# Patient Record
Sex: Female | Born: 1965 | Race: White | Hispanic: No | Marital: Married | Smoking: Former smoker
Health system: Southern US, Community
[De-identification: ages and names within clinical notes are randomized; demographics above are authoritative.]

## PROBLEM LIST (undated history)

## (undated) DIAGNOSIS — G43909 Migraine, unspecified, not intractable, without status migrainosus: Secondary | ICD-10-CM

## (undated) DIAGNOSIS — E079 Disorder of thyroid, unspecified: Secondary | ICD-10-CM

## (undated) DIAGNOSIS — F32A Depression, unspecified: Secondary | ICD-10-CM

## (undated) DIAGNOSIS — E559 Vitamin D deficiency, unspecified: Secondary | ICD-10-CM

## (undated) DIAGNOSIS — F329 Major depressive disorder, single episode, unspecified: Secondary | ICD-10-CM

## (undated) DIAGNOSIS — E538 Deficiency of other specified B group vitamins: Secondary | ICD-10-CM

## (undated) DIAGNOSIS — K219 Gastro-esophageal reflux disease without esophagitis: Secondary | ICD-10-CM

## (undated) DIAGNOSIS — M8580 Other specified disorders of bone density and structure, unspecified site: Secondary | ICD-10-CM

## (undated) DIAGNOSIS — K8020 Calculus of gallbladder without cholecystitis without obstruction: Secondary | ICD-10-CM

## (undated) DIAGNOSIS — D649 Anemia, unspecified: Secondary | ICD-10-CM

## (undated) HISTORY — DX: Calculus of gallbladder without cholecystitis without obstruction: K80.20

## (undated) HISTORY — PX: ESOPHAGOGASTRODUODENOSCOPY ENDOSCOPY: SHX5814

## (undated) HISTORY — DX: Anemia, unspecified: D64.9

## (undated) HISTORY — DX: Vitamin D deficiency, unspecified: E55.9

## (undated) HISTORY — DX: Depression, unspecified: F32.A

## (undated) HISTORY — DX: Disorder of thyroid, unspecified: E07.9

## (undated) HISTORY — DX: Major depressive disorder, single episode, unspecified: F32.9

## (undated) HISTORY — PX: TONSILLECTOMY: SUR1361

## (undated) HISTORY — DX: Gastro-esophageal reflux disease without esophagitis: K21.9

## (undated) HISTORY — DX: Other specified disorders of bone density and structure, unspecified site: M85.80

## (undated) HISTORY — DX: Deficiency of other specified B group vitamins: E53.8

## (undated) HISTORY — DX: Migraine, unspecified, not intractable, without status migrainosus: G43.909

---

## 1997-09-20 HISTORY — PX: TUBAL LIGATION: SHX77

## 1998-06-27 ENCOUNTER — Ambulatory Visit (HOSPITAL_COMMUNITY): Admission: RE | Admit: 1998-06-27 | Discharge: 1998-06-27 | Payer: Self-pay | Admitting: Obstetrics and Gynecology

## 1998-09-20 HISTORY — PX: TONSILLECTOMY: SUR1361

## 1999-03-03 ENCOUNTER — Ambulatory Visit (HOSPITAL_BASED_OUTPATIENT_CLINIC_OR_DEPARTMENT_OTHER): Admission: RE | Admit: 1999-03-03 | Discharge: 1999-03-03 | Payer: Self-pay | Admitting: Otolaryngology

## 2000-05-02 ENCOUNTER — Ambulatory Visit (HOSPITAL_COMMUNITY): Admission: RE | Admit: 2000-05-02 | Discharge: 2000-05-02 | Payer: Self-pay | Admitting: Geriatric Medicine

## 2000-06-23 ENCOUNTER — Encounter: Admission: RE | Admit: 2000-06-23 | Discharge: 2000-06-23 | Payer: Self-pay | Admitting: Geriatric Medicine

## 2000-06-23 ENCOUNTER — Encounter: Payer: Self-pay | Admitting: Geriatric Medicine

## 2000-11-30 ENCOUNTER — Encounter: Payer: Self-pay | Admitting: Obstetrics and Gynecology

## 2000-11-30 ENCOUNTER — Encounter: Admission: RE | Admit: 2000-11-30 | Discharge: 2000-11-30 | Payer: Self-pay | Admitting: Obstetrics and Gynecology

## 2001-07-10 ENCOUNTER — Encounter: Admission: RE | Admit: 2001-07-10 | Discharge: 2001-07-10 | Payer: Self-pay | Admitting: Geriatric Medicine

## 2001-07-10 ENCOUNTER — Encounter: Payer: Self-pay | Admitting: Geriatric Medicine

## 2003-01-15 ENCOUNTER — Encounter: Admission: RE | Admit: 2003-01-15 | Discharge: 2003-01-15 | Payer: Self-pay | Admitting: Geriatric Medicine

## 2003-01-15 ENCOUNTER — Encounter: Payer: Self-pay | Admitting: Geriatric Medicine

## 2004-07-01 ENCOUNTER — Other Ambulatory Visit: Admission: RE | Admit: 2004-07-01 | Discharge: 2004-07-01 | Payer: Self-pay | Admitting: Obstetrics and Gynecology

## 2005-06-22 ENCOUNTER — Encounter: Admission: RE | Admit: 2005-06-22 | Discharge: 2005-06-22 | Payer: Self-pay | Admitting: Obstetrics and Gynecology

## 2005-07-15 ENCOUNTER — Other Ambulatory Visit: Admission: RE | Admit: 2005-07-15 | Discharge: 2005-07-15 | Payer: Self-pay | Admitting: Obstetrics and Gynecology

## 2005-10-04 ENCOUNTER — Encounter: Admission: RE | Admit: 2005-10-04 | Discharge: 2005-10-04 | Payer: Self-pay | Admitting: Obstetrics and Gynecology

## 2006-08-30 ENCOUNTER — Other Ambulatory Visit: Admission: RE | Admit: 2006-08-30 | Discharge: 2006-08-30 | Payer: Self-pay | Admitting: Geriatric Medicine

## 2006-09-07 ENCOUNTER — Encounter: Admission: RE | Admit: 2006-09-07 | Discharge: 2006-09-07 | Payer: Self-pay | Admitting: Geriatric Medicine

## 2006-09-20 HISTORY — PX: LASIK: SHX215

## 2007-09-11 ENCOUNTER — Encounter: Admission: RE | Admit: 2007-09-11 | Discharge: 2007-09-11 | Payer: Self-pay | Admitting: Geriatric Medicine

## 2007-09-21 HISTORY — PX: ABDOMINAL HYSTERECTOMY: SHX81

## 2007-12-27 ENCOUNTER — Other Ambulatory Visit: Admission: RE | Admit: 2007-12-27 | Discharge: 2007-12-27 | Payer: Self-pay | Admitting: Obstetrics and Gynecology

## 2008-01-29 ENCOUNTER — Ambulatory Visit (HOSPITAL_COMMUNITY): Admission: RE | Admit: 2008-01-29 | Discharge: 2008-01-30 | Payer: Self-pay | Admitting: Obstetrics and Gynecology

## 2008-01-29 ENCOUNTER — Encounter (INDEPENDENT_AMBULATORY_CARE_PROVIDER_SITE_OTHER): Payer: Self-pay | Admitting: Obstetrics and Gynecology

## 2008-09-11 ENCOUNTER — Encounter: Admission: RE | Admit: 2008-09-11 | Discharge: 2008-09-11 | Payer: Self-pay | Admitting: Urology

## 2008-12-26 ENCOUNTER — Encounter: Admission: RE | Admit: 2008-12-26 | Discharge: 2008-12-26 | Payer: Self-pay | Admitting: Geriatric Medicine

## 2009-09-18 ENCOUNTER — Encounter: Admission: RE | Admit: 2009-09-18 | Discharge: 2009-09-18 | Payer: Self-pay | Admitting: Geriatric Medicine

## 2009-09-20 HISTORY — PX: COLONOSCOPY: SHX174

## 2009-12-09 ENCOUNTER — Encounter (INDEPENDENT_AMBULATORY_CARE_PROVIDER_SITE_OTHER): Payer: Self-pay | Admitting: *Deleted

## 2010-01-13 ENCOUNTER — Ambulatory Visit: Payer: Self-pay | Admitting: Gastroenterology

## 2010-01-13 ENCOUNTER — Encounter (INDEPENDENT_AMBULATORY_CARE_PROVIDER_SITE_OTHER): Payer: Self-pay | Admitting: *Deleted

## 2010-01-13 DIAGNOSIS — K5909 Other constipation: Secondary | ICD-10-CM | POA: Insufficient documentation

## 2010-01-14 LAB — CONVERTED CEMR LAB
BUN: 12 mg/dL (ref 6–23)
Basophils Absolute: 0 10*3/uL (ref 0.0–0.1)
CO2: 28 meq/L (ref 19–32)
Calcium: 9.5 mg/dL (ref 8.4–10.5)
Chloride: 108 meq/L (ref 96–112)
Creatinine, Ser: 0.7 mg/dL (ref 0.4–1.2)
GFR calc non Af Amer: 96.12 mL/min (ref >60)
Glucose, Bld: 84 mg/dL (ref 70–99)
Hemoglobin: 12.9 g/dL (ref 12.0–15.0)
Lymphocytes Relative: 38.5 % (ref 12.0–46.0)
MCHC: 34.5 g/dL (ref 30.0–36.0)
MCV: 87.4 fL (ref 78.0–100.0)
Potassium: 4.6 meq/L (ref 3.5–5.1)
RDW: 13.9 % (ref 11.5–14.6)
Sodium: 141 meq/L (ref 135–145)
WBC: 6.4 10*3/uL (ref 4.5–10.5)

## 2010-01-21 ENCOUNTER — Ambulatory Visit: Payer: Self-pay | Admitting: Gastroenterology

## 2010-01-21 ENCOUNTER — Encounter (INDEPENDENT_AMBULATORY_CARE_PROVIDER_SITE_OTHER): Payer: Self-pay | Admitting: *Deleted

## 2010-01-27 ENCOUNTER — Encounter: Payer: Self-pay | Admitting: Gastroenterology

## 2010-03-10 ENCOUNTER — Ambulatory Visit: Payer: Self-pay | Admitting: Gastroenterology

## 2010-04-13 ENCOUNTER — Telehealth: Payer: Self-pay | Admitting: Gastroenterology

## 2010-06-25 ENCOUNTER — Telehealth: Payer: Self-pay | Admitting: Gastroenterology

## 2010-09-20 DIAGNOSIS — Z780 Asymptomatic menopausal state: Secondary | ICD-10-CM

## 2010-09-20 HISTORY — DX: Asymptomatic menopausal state: Z78.0

## 2010-09-25 ENCOUNTER — Encounter
Admission: RE | Admit: 2010-09-25 | Discharge: 2010-09-25 | Payer: Self-pay | Source: Home / Self Care | Attending: Geriatric Medicine | Admitting: Geriatric Medicine

## 2010-10-11 ENCOUNTER — Encounter: Payer: Self-pay | Admitting: Geriatric Medicine

## 2010-10-22 NOTE — Procedures (Signed)
Summary: Colonoscopy  Patient: [name] Note: All result statuses are Final unless otherwise noted.  Tests: (1) Colonoscopy (COL)   COL Colonoscopy           DONE     [facility]     [address]     [place], [place]  [address]           COLONOSCOPY PROCEDURE REPORT           PATIENT:  Sabrina Mcdaniel, Sabrina Mcdaniel  MR#:  [id number]     BIRTHDATE:  12/11/1965, 44 yrs. old  GENDER:  female     ENDOSCOPIST:  Rachael Fee, MD     REF. BY:  Merlene Laughter, M.D.     PROCEDURE DATE:  01/21/2010     PROCEDURE:  Colonoscopy with snare polypectomy     ASA CLASS:  Class II     INDICATIONS:  chronic constipation     MEDICATIONS:   Fentanyl 75 mcg IV, Versed 7 mg IV     DESCRIPTION OF PROCEDURE:   After the risks benefits and     alternatives of the procedure were thoroughly explained, informed     consent was obtained.  Digital rectal exam was performed and     revealed no abnormalities.   The LB CF-[id number] endoscope     was introduced through the anus and advanced to the cecum, which     was identified by both the appendix and ileocecal valve, without     limitations.  The quality of the prep was good, using MoviPrep.     The instrument was then slowly withdrawn as the colon was fully     examined.     <<PROCEDUREIMAGES>>     FINDINGS: Two small sessile polyps were found, both were removed     and sent to pathology (jar 1). These were 2-15mm across, removed     with cold snare, located in transverse colon and sigmoid colon     (see image4 and image3).  This was otherwise a normal examination     of the colon (see image5, image2, and image1). Retroflexed views     in the rectum revealed no abnormalities. The scope was then     withdrawn from the patient and the procedure completed.     COMPLICATIONS:  None     ENDOSCOPIC IMPRESSION:     1) Two small polyp, both removed and sent to pathology     2) Otherwise normal examination           RECOMMENDATIONS:     1) If the polyp(s)  removed today are proven to be adenomatous     (pre-cancerous) polyps, you will need a repeat colonoscopy in 5     years. Otherwise you should continue to follow colorectal cancer     screening guidelines for "routine risk" patients with colonoscopy     in 10 years.     2) You will receive a letter within 1-2 weeks with the results     of your biopsy as well as final recommendations. Please call my     office if you have not received a letter after 3 weeks.     3) Continue 2 doses of miralax a day, also start 1 tbs of citrucel     fiber supplement daily.     4) Dr. Christella Hartigan' office will call to schedule return appointment in     6-7 weeks.  ______________________________     Rachael Fee, MD           n.     eSIGNED:   Rachael Fee at 01/21/2010 02:13 PM           Lou Miner, [id number]  Note: An exclamation mark (!) indicates a result that was not dispersed into the flowsheet. Document Creation Date: 01/21/2010 2:13 PM _______________________________________________________________________  (1) Order result status: Final Collection or observation date-time: 01/21/2010 14:06 Requested date-time:  Receipt date-time:  Reported date-time:  Referring Physician:   Ordering Physician: Rob Bunting [phone]) Specimen Source:  Source: Launa Grill Order Number: [id number] Lab site:   Appended Document: Colonoscopy     Procedures Next Due Date:    Colonoscopy: 01/2020

## 2010-10-22 NOTE — Letter (Signed)
Summary: Results Letter  [facility]  [address] [place], [place] [address]   Phone: [phone]  Fax: [phone]        Jan 27, 2010 MRN: [id number]    Sabrina Mcdaniel [address] [place], [place]  [address]    Dear Ms. Haver,   Good news.  The polyp(s) that were removed during your recent procedure were NOT pre-cancerous.  You should continue to follow current colorectal cancer screening guidelines with a repeat colonoscopy in 10 years.  We will therefore put your information in our reminder system and will contact you in 10 years to schedule a repeat procedure.  Please call if you have any questions or concerns.  Otherwise continue the suggestions outline on the day of the procedure and I look forward to seeing you back in the office to see if this has helped.     Sincerely,  Rachael Fee MD  This letter has been electronically signed by your physician.  Appended Document: Results Letter letter mailed 5.10.11

## 2010-10-22 NOTE — Progress Notes (Signed)
Summary: Triage  Phone Note Call from Patient Call back at Home Phone [phone]   Caller: Patient Call For: Dr. Christella Hartigan Reason for Call: Talk to Nurse Summary of Call: The AMITIZA 24 Mcg is to much....cannot function b/c of the diarrhea...still experiencing bloating Initial call taken by: Karna Christmas,  June 25, 2010 1:37 PM  Follow-up for Phone Call        Amitiza 24 mcg at bedtime is to much.  She has diarrhea and bloating.  She was on Amitiza 8 micrograms that did not work well enough.  She also has a feeling of "fullness"  and an uncomfortable feeling in her chest.  She is taking Prilosec once daily 20-30 minutes before breakfast.  This has helped the heartburn.  Please advise Follow-up by: Chales Abrahams CMA Duncan Dull),  June 25, 2010 1:47 PM  Additional Follow-up for Phone Call Additional follow up Details #1::        let's try pills, take two pills ( total) twice daily.  disp 120, 1 refill Additional Follow-up by: Rachael Fee MD,  June 25, 2010 1:56 PM    Additional Follow-up for Phone Call Additional follow up Details #2::    pt aware rx sent Follow-up by: Chales Abrahams CMA (AAMA),  June 25, 2010 2:00 PM  New/Updated Medications: AMITIZA 8 MCG CAPS (LUBIPROSTONE) take 2 by mouth two times a day Prescriptions: AMITIZA 8 MCG CAPS (LUBIPROSTONE) take 2 by mouth two times a day  #120 x 1   Entered by:   Chales Abrahams CMA (AAMA)   Authorized by:   Rachael Fee MD   Signed by:   Chales Abrahams CMA (AAMA) on 06/25/2010   Method used:   Electronically to        [facility] (retail)       [address]       [place], [place]  [address]       Ph: [phone]       Fax: [phone]   RxID:   [id number]

## 2010-10-22 NOTE — Letter (Signed)
Summary: New Patient letter  [facility]  [address] [place], [place] [address]   Phone: [phone]  Fax: [phone]       12/09/2009 MRN: [id number]  Sabrina Mcdaniel [address] [place], [place]  [address]  Dear Sabrina Mcdaniel,  Welcome to the Gastroenterology Division at [facility].    You are scheduled to see Dr.  Rob Bunting on 01-13-2010 at 3:00pm on the 3rd floor at [facility], [address]  We ask that you try to arrive at our office 15 minutes prior to your appointment time to allow for check-in.  We would like you to complete the enclosed self-administered evaluation form prior to your visit and bring it with you on the day of your appointment.  We will review it with you.  Also, please bring a complete list of all your medications or, if you prefer, bring the medication bottles and we will list them.  Please bring your insurance card so that we may make a copy of it.  If your insurance requires a referral to see a specialist, please bring your referral form from your primary care physician.  Co-payments are due at the time of your visit and may be paid by cash, check or credit card.     Your office visit will consist of a consult with your physician (includes a physical exam), any laboratory testing he/she may order, scheduling of any necessary diagnostic testing (e.g. x-ray, ultrasound, CT-scan), and scheduling of a procedure (e.g. Endoscopy, Colonoscopy) if required.  Please allow enough time on your schedule to allow for any/all of these possibilities.    If you cannot keep your appointment, please call [phone] to cancel or reschedule prior to your appointment date.  This allows [place] the opportunity to schedule an appointment for another patient in need of care.  If you do not cancel or reschedule by 5 p.m. the business day prior to your appointment date, you will be charged a $50.00 late cancellation/no-show fee.    Thank you for choosing  [facility] for your medical needs.  We appreciate the opportunity to care for you.  Please visit [place] at our website  to learn more about our practice.                     Sincerely,                                                             The Gastroenterology Division

## 2010-10-22 NOTE — Letter (Signed)
Summary: [facility] Instructions  [facility]  [address] [place], [place] [address]   Phone: [phone]  Fax: [phone]       Sabrina Mcdaniel    [birth year]    MRN: [id number]        Procedure Day /Date:01/19/10     Arrival Time:930 am     Procedure Time:1030 am     Location of Procedure:                    X  [facility] (4th Floor)   PREPARATION FOR COLONOSCOPY WITH MOVIPREP   Starting 5 days prior to your procedure 01/14/10  do not eat nuts, seeds, popcorn, corn, beans, peas,  salads, or any raw vegetables.  Do not take any fiber supplements (e.g. Metamucil, Citrucel, and Benefiber).  THE DAY BEFORE YOUR PROCEDURE         DATE: 01/18/10  DAY: SUN  1.  Drink clear liquids the entire day-NO SOLID FOOD  2.  Do not drink anything colored red or purple.  Avoid juices with pulp.  No orange juice.  3.  Drink at least 64 oz. (8 glasses) of fluid/clear liquids during the day to prevent dehydration and help the prep work efficiently.  CLEAR LIQUIDS INCLUDE: Water Jello Ice Popsicles Tea (sugar ok, no milk/cream) Powdered fruit flavored drinks Coffee (sugar ok, no milk/cream) Gatorade Juice: apple, white grape, white cranberry  Lemonade Clear bullion, consomm, broth Carbonated beverages (any kind) Strained chicken noodle soup Hard Candy                             4.  In the morning, mix first dose of MoviPrep solution:    Empty 1 Pouch A and 1 Pouch B into the disposable container    Add lukewarm drinking water to the top line of the container. Mix to dissolve    Refrigerate (mixed solution should be used within 24 hrs)  5.  Begin drinking the prep at 5:00 p.m. The MoviPrep container is divided by 4 marks.   Every 15 minutes drink the solution down to the next mark (approximately 8 oz) until the full liter is complete.   6.  Follow completed prep with 16 oz of clear liquid of your choice (Nothing red or purple).  Continue to drink clear  liquids until bedtime.  7.  Before going to bed, mix second dose of MoviPrep solution:    Empty 1 Pouch A and 1 Pouch B into the disposable container    Add lukewarm drinking water to the top line of the container. Mix to dissolve    Refrigerate  THE DAY OF YOUR PROCEDURE      DATE: 01/19/10 DAY:MON  Beginning at 530 a.m. (5 hours before procedure):         1. Every 15 minutes, drink the solution down to the next mark (approx 8 oz) until the full liter is complete.  2. Follow completed prep with 16 oz. of clear liquid of your choice.    3. You may drink clear liquids until 830 am  (2 HOURS BEFORE PROCEDURE).   MEDICATION INSTRUCTIONS  Unless otherwise instructed, you should take regular prescription medications with a small sip of water   as early as possible the morning of your procedure. ients -            OTHER INSTRUCTIONS  You will need a responsible adult at  least 46 years of age to accompany you and drive you home.   This person must remain in the waiting room during your procedure.  Wear loose fitting clothing that is easily removed.  Leave jewelry and other valuables at home.  However, you may wish to bring a book to read or  an iPod/MP3 player to listen to music as you wait for your procedure to start.  Remove all body piercing jewelry and leave at home.  Total time from sign-in until discharge is approximately 2-3 hours.  You should go home directly after your procedure and rest.  You can resume normal activities the  day after your procedure.  The day of your procedure you should not:   Drive   Make legal decisions   Operate machinery   Drink alcohol   Return to work  You will receive specific instructions about eating, activities and medications before you leave.    The above instructions have been reviewed and explained to me by   _______________________    I fully understand and can verbalize these instructions  _____________________________ Date _________

## 2010-10-22 NOTE — Progress Notes (Signed)
Summary: Triage  Phone Note Call from Patient Call back at Home Phone [phone]   Caller: Patient Call For: Dr. Christella Hartigan Reason for Call: Talk to Nurse Summary of Call: Update: Prilosec seems to controling her heartburn but still only having a BM every 3-4 days with the Amitiza.[place] KitchenMarland KitchenMarland KitchenMarland Kitchenswitched pharmacies to [facility] on [address] Initial call taken by: Karna Christmas,  April 13, 2010 12:47 PM  Follow-up for Phone Call        Pt calling to update pt that the Prilosec is helping great but  the Amitiza is not working as well.  Only going every 3-4 days.  It is only 1 time per day then.  Still concerned about gaining weight.  What else can she try? Should she increase the Amitiza? Follow-up by: Chales Abrahams CMA Duncan Dull),  April 13, 2010 1:41 PM  Additional Follow-up for Phone Call Additional follow up Details #1::        lets increase her amitiza to pills, take one pill two times a day, disp 60 with 2 refills. Additional Follow-up by: Rachael Fee MD,  April 14, 2010 7:23 AM    Additional Follow-up for Phone Call Additional follow up Details #2::    pt aware new rx sent Follow-up by: Chales Abrahams CMA Duncan Dull),  April 14, 2010 1:41 PM  New/Updated Medications: AMITIZA 24 MCG CAPS (LUBIPROSTONE) 1 by mouth two times a day Prescriptions: AMITIZA 24 MCG CAPS (LUBIPROSTONE) 1 by mouth two times a day  #60 x 2   Entered by:   Chales Abrahams CMA (AAMA)   Authorized by:   Rachael Fee MD   Signed by:   Chales Abrahams CMA (AAMA) on 04/14/2010   Method used:   Electronically to        [facility] (retail)       [address]       [place], [place]  [address]       Ph: [phone]       Fax: [phone]   RxID:   [id number]

## 2010-10-22 NOTE — Letter (Signed)
Summary: Appt Reminder 2  [facility]  [address] [place], [place] [address]   Phone: [phone]  Fax: [phone]        Jan 21, 2010 MRN: [id number]    Sabrina Mcdaniel [address] [place], [place]  [address]    Dear Ms. Buzby,   You have a return appointment with Dr. Christella Hartigan on 03/03/10 at 9:15am.  Please remember to bring a complete list of the medicines you are taking, your insurance card and your co-pay.  If you have to cancel or reschedule this appointment, please call before 5:00 pm the evening before to avoid a cancellation fee.  If you have any questions or concerns, please call [phone].    Sincerely,    Chales Abrahams CMA (AAMA)

## 2010-10-22 NOTE — Assessment & Plan Note (Signed)
History of Present Illness Visit Type: Initial Visit Primary GI MD: Rob Bunting MD Primary Provider: Merlene Laughter, MD Chief Complaint: dyspepsia, bloating History of Present Illness:     very pleasant 45 year old woman who has had years of myriad GI symptoms.  She is bothered by bloating.  Constipation for many years (2-3 years).  She will usually have a BM only once a week.  She  has a full feeling, even 2 hours after eating.  A lot of belching.  She never sees blood in stool.  Rectocele repair in 2009 and hysterectomy.  she takes excedrin or advil about once a week.  She has gained about 10-15 pounds in the past year.  she has tried benefiber for about a month or two and didn't notice much improvement.  She tried miralax for 2-3 months without improvement in frequency.             Current Medications (verified): 1)  Cyanocobalamin 1000 Mcg/ml Soln (Cyanocobalamin) .... Monthly 2)  Prilosec Otc 20 Mg Tbec (Omeprazole Magnesium) .... Once Daily As Needed 3)  Melatonin 3 Mg Tabs (Melatonin) .... At Bedtime 4)  Multivitamins  Tabs (Multiple Vitamin) .... Once Daily 5)  Excedrin Migraine 250-250-65 Mg Tabs (Aspirin-Acetaminophen-Caffeine) .... As Needed 6)  Advil 200 Mg Tabs (Ibuprofen) .... As Needed  Allergies (verified): No Known Drug Allergies  Past History:  Past Medical History: anemia Chronic headaches Gallstones GERD  Past Surgical History: hysterectomy 2009 Tubal ligation 1997  Family History: no colon cancer  Social History: she is married, she has 2 children, she works as a Environmental consultant, she quit smoking, she does not drink alcohol or caffeinated beverages  Review of Systems       Pertinent positive and negative review of systems were noted in the above HPI and GI specific review of systems.  All other review of systems was otherwise negative.   Vital Signs:  Patient profile:   45 year old female Height:      63 inches Weight:       180.38 pounds BMI:     32.07 Pulse rate:   68 / minute Pulse rhythm:   regular BP sitting:   110 / 68  (left arm) Cuff size:   regular  Vitals Entered By: June McMurray CMA Duncan Dull) (January 13, 2010 2:45 PM)  Physical Exam  Additional Exam:  Constitutional: generally well appearing Psychiatric: alert and oriented times 3 Eyes: extraocular movements intact Mouth: oropharynx moist, no lesions Neck: supple, no lymphadenopathy Cardiovascular: heart regular rate and rythm Lungs: CTA bilaterally Abdomen: soft, non-tender, non-distended, no obvious ascites, no peritoneal signs, normal bowel sounds Extremities: no lower extremity edema bilaterally Skin: no lesions on visible extremities    Impression & Recommendations:  Problem # 1:  chronic constipation I think her biggest issue is her constipation and this is leading to a lot of upper and lower GI discomforts including dyspepsia, bloating, belching, abdominal discomforts. We will proceed with colonoscopy at her soonest convenience to rule out neoplastic cause however think this is unlikely. Would also check a basic set of labs including a CBC, basic metabolic profile, thyroid testing. However begin taking MiraLax 2 doses the day prior to her colonoscopy and following that I will want her to be on a mixture of MiraLax and fiber supplements for about one to 2 months to see if that helps.  Other Orders: TLB-CBC Platelet - w/Differential (85025-CBCD) TLB-TSH (Thyroid Stimulating Hormone) (84443-TSH) TLB-BMP (Basic Metabolic Panel-BMET) (80048-METABOL)  Patient  Instructions: 1)  You will be scheduled to have a colonoscopy. 2)  Please take 2 capfulls of miralax every day before the colonoscopy. 3)  You will get lab test(s) done today (tsh, cbc, bmet). 4)  The medication list was reviewed and reconciled.  All changed / newly prescribed medications were explained.  A complete medication list was provided to the patient / caregiver.  Appended  Document: Orders Update/movi    Clinical Lists Changes  Medications: Added new medication of MOVIPREP 100 GM  SOLR (PEG-KCL-NACL-NASULF-NA ASC-C) As per prep instructions. - Signed Rx of MOVIPREP 100 GM  SOLR (PEG-KCL-NACL-NASULF-NA ASC-C) As per prep instructions.;  #1 x 0;  Signed;  Entered by: Sabrina Mcdaniel CMA (AAMA);  Authorized by: Rachael Fee MD;  Method used: Electronically to [facility] # [id number]*, [address] [place], [place], [place]  [address], Ph: [phone] or [phone], Fax: [phone] Orders: Added new Test order of Colonoscopy (Colon) - Signed    Prescriptions: MOVIPREP 100 GM  SOLR (PEG-KCL-NACL-NASULF-NA ASC-C) As per prep instructions.  #1 x 0   Entered by:   Sabrina Mcdaniel CMA (AAMA)   Authorized by:   Rachael Fee MD   Signed by:   Sabrina Mcdaniel CMA (AAMA) on 01/13/2010   Method used:   Electronically to        [id number] (retail)       [address]       [place], [place]  [address]       Ph: [phone] or [phone]       Fax: [phone]   RxID:   [id number]

## 2010-10-22 NOTE — Miscellaneous (Signed)
Summary: med update  Clinical Lists Changes  Medications: Removed medication of MOVIPREP 100 GM  SOLR (PEG-KCL-NACL-NASULF-NA ASC-C) As per prep instructions.

## 2010-10-22 NOTE — Assessment & Plan Note (Signed)
Review of gastrointestinal problems: 1. Routine risk for colon cancer, colonoscopy may 2011 found hyperplastic polyp only, next colonoscopy 2021. 2. chronic constipation    History of Present Illness Visit Type: Follow-up Visit Primary GI MD: Rob Bunting MD Primary Provider: Merlene Laughter, MD Chief Complaint: follow up History of Present Illness:     very pleasant 45 year old woman whom I last saw at the time of her colonoscopy 6 weeks ago, since then she was taking miralax two doses a day and citrucel once dose a day.  She backed off on both of those and she seemed better overall. She will go only once every 3-4 days.  Not as uncomfortable.    She went camping last week, her family was bothered by a lot of flatulence as well a "near belches."  She feels acid in trhoat when lays down.  She has been taking prilosec every day for the past 3 weeks.  IN the AM, after brealfast, usually around 10-11 am.           Current Medications (verified): 1)  Cyanocobalamin 1000 Mcg/ml Soln (Cyanocobalamin) .... Monthly 2)  Prilosec Otc 20 Mg Tbec (Omeprazole Magnesium) .... Once Daily As Needed 3)  Melatonin 3 Mg Tabs (Melatonin) .... At Bedtime 4)  Multivitamins  Tabs (Multiple Vitamin) .... Once Daily 5)  Excedrin Migraine 250-250-65 Mg Tabs (Aspirin-Acetaminophen-Caffeine) .... As Needed 6)  Advil 200 Mg Tabs (Ibuprofen) .... As Needed  Allergies (verified): No Known Drug Allergies  Vital Signs:  Patient profile:   45 year old female Height:      63 inches Weight:      180 pounds BMI:     32.00 BSA:     1.85 Pulse rate:   58 / minute Pulse rhythm:   regular BP sitting:   100 / 60  (left arm)  Vitals Entered By: Merri Ray CMA Duncan Dull) (March 10, 2010 3:01 PM)  Physical Exam  Additional Exam:  Constitutional: generally well appearing Psychiatric: alert and oriented times 3 Abdomen: soft, non-tender, non-distended, normal bowel sounds    Impression &  Recommendations:  Problem # 1:  GERD symptoms she is not taking proton pump inhibitor at the correct time in relation to meals and so I have recommended she change that. She will also H2 blocker at night.  Problem # 2:  chronic constipation amitza trial given that she has not been well on MiraLax, fiber supplements, or a combination of the 2 medicines.  Patient Instructions: 1)  You should change the way you are taking your antiacid medicine (prilosec) so that you are taking it 20-30 minutes prior to a decent dinner meal as that is the way the pill is designed to work most effectively. 2)  If no changes in acid symptoms after 2 weeks, start pepcid one pill at bedtime. 3)  Trial of amitiza, 8mg  twice daily. 4)  Stay on fiber, one dose daily. 5)  Stop miralax for now. 6)  Call Dr. Christella Hartigan' office in 5-6 weeks to report on symptoms. 7)  The medication list was reviewed and reconciled.  All changed / newly prescribed medications were explained.  A complete medication list was provided to the patient / caregiver. Prescriptions: AMITIZA 8 MCG  CAPS (LUBIPROSTONE) 1 two times a day/take with food and water  #60 x 2   Entered and Authorized by:   Rachael Fee MD   Signed by:   Rachael Fee MD on 03/10/2010   Method used:   Electronically  to        [id number] (retail)       [address]       [place], [place]  [address]       Ph: [phone] or [phone]       Fax: [phone]   RxID:   [id number]   Appended Document: Orders Update/MOVI    Clinical Lists Changes  Problems: Added new problem of SPECIAL SCREENING FOR MALIGNANT NEOPLASMS COLON (ICD-V76.51) Medications: Added new medication of MOVIPREP 100 GM  SOLR (PEG-KCL-NACL-NASULF-NA ASC-C) As per prep instructions. - Signed Rx of MOVIPREP 100 GM  SOLR (PEG-KCL-NACL-NASULF-NA ASC-C) As per prep instructions.;  #1 x 0;  Signed;  Entered by: Chales Abrahams CMA (AAMA);  Authorized by:  Rachael Fee MD;  Method used: Electronically to [id number] [address] [place], [place], [place]  [address], Ph: [phone] or [phone], Fax: [phone] Orders: Added new Test order of Colon/Endo (Colon/Endo) - Signed    Prescriptions: MOVIPREP 100 GM  SOLR (PEG-KCL-NACL-NASULF-NA ASC-C) As per prep instructions.  #1 x 0   Entered by:   Chales Abrahams CMA (AAMA)   Authorized by:   Rachael Fee MD   Signed by:   Chales Abrahams CMA (AAMA) on 03/10/2010   Method used:   Electronically to        [id number] (retail)       [address]       [place], [place]  [address]       Ph: [phone] or [phone]       Fax: [phone]   RxID:   [id number]

## 2011-02-02 NOTE — Op Note (Signed)
Sabrina Mcdaniel, BOEREMA                ACCOUNT NO.:  [id number]   MEDICAL RECORD NO.:  [id number]          PATIENT TYPE:  OIB   LOCATION:  9302                          FACILITY:  [facility]   PHYSICIAN:  Gerald Leitz, MD          DATE OF BIRTH:  09-10-1966   DATE OF PROCEDURE:  DATE OF DISCHARGE:                               OPERATIVE REPORT   PREOPERATIVE DIAGNOSES:  1. Uterine prolapse.  2. Cystocele.  3. Rectocele.   POSTOPERATIVE DIAGNOSES:  1. Uterine prolapse.  2. Cystocele.  3. Rectocele.   PROCEDURE:  Vaginal hysterectomy and anterior and posterior repair.   SURGEON:  Gerald Leitz, MD.   ASSISTANT:  Charles A. Delcambre, MD.   ANESTHESIA:  General.   FINDINGS:  A moderate cystocele, rectocele, and grade 2 uterine  prolapse.  Ovaries appeared normal bilaterally.   SPECIMEN:  Uterus and cervix.   DISPOSITION OF SPECIMEN:  Pathology.   ESTIMATED BLOOD LOSS:  75 mL.   URINE OUTPUT:  300 mL.   FLUIDS:  1000 mL.   COMPLICATIONS:  None.   PROCEDURE:  The patient was taken to the operating room where she was  placed under general anesthesia.  She was placed in dorsal lithotomy  position.  She was prepped and draped in the usual sterile fashion.  A  weighted speculum was placed into the vagina, and the cervix was grasped  with Lahey clamps.  The cervix was injected circumferentially with 1%  Xylocaine with 1:100,000 epinephrine.  The cervix was circumferentially  incised with a scalpel, and the bladder was dissected off the  pubovesical cervical fascia anteriorly with a sponge stick and  Metzenbaum scissors.  The anterior cul-de-sac was entered sharply.  The  procedure was performed posteriorly, and the posterior cul-de-sac was  entered sharply without difficulty.  At this point, a Heaney clamp was  placed over the uterosacral ligaments on either side.  These were then  transected and suture ligated with 0 Vicryl.  Excellent hemostasis was  noted.  The cardinal ligaments were  then clamped on both sides,  transected, and suture ligated with 0 Vicryl.  The uterine arteries and  broad ligament was then serially clamped with Heaney clamps, transected,  and suture ligated with 0 Vicryl.  Both cornua were clamped with Heaney  clamps, transected, and uterus was delivered.  These pedicles were  suture ligated with a free tie of 0 Vicryl followed by suture ligature  of 0 Vicryl.  Excellent hemostasis was noted.  The peritoneum was closed  with a pursestring suture of 2-0 Vicryl.  Attention was turned to the  anterior vaginal mucosa which was incised along the midline and the  underlying cystocele was reduced.  The endopelvic fascia was identified  then reapproximated with interrupted suture ligatures of 2-0 Vicryl.  Redundant vaginal mucosa was excised, and the vaginal mucosa was then  reapproximated with 2-0 Vicryl in a running locked fashion.  Attention  was turned to the perineum for posterior repair.  Triangle incision was  made on the perineum.  The posterior vaginal mucosa was  incised along  the midline.  The rectocele was dissected off the posterior vaginal  mucosa.  Endopelvic fascia was identified and reapproximated with  interrupted sutures of 2-0 Vicryl.  A very small less than 1 cm segment  of the posterior vaginal mucosa was excised.  The posterior vaginal cuff  was then reapproximated with 2-0 Vicryl in a running locked fashion.  Excellent hemostasis was noted.  Attention was turned to the vaginal  cuff where vaginal cuff angle sutures were placed using 0 Vicryl and  transfixed to the ipsilateral uterosacral ligament.  The remainder of  the cuff was closed with 0 Vicryl in a running locked fashion.  Excellent hemostasis was noted.  All instruments were removed from the  vagina.  The patient was taken out of dorsal lithotomy position,  awakened from general anesthesia, taken to the operating room, awake and  in stable condition.  Sponge, lap, and needle  counts were correct x2.      Gerald Leitz, MD  Electronically Signed     TC/MEDQ  D:  01/29/2008  T:  01/30/2008  Job:  [id number]

## 2011-08-27 ENCOUNTER — Other Ambulatory Visit: Payer: Self-pay | Admitting: Geriatric Medicine

## 2011-08-27 DIAGNOSIS — Z1231 Encounter for screening mammogram for malignant neoplasm of breast: Secondary | ICD-10-CM

## 2011-09-21 HISTORY — PX: ESOPHAGOGASTRODUODENOSCOPY ENDOSCOPY: SHX5814

## 2011-10-08 ENCOUNTER — Ambulatory Visit
Admission: RE | Admit: 2011-10-08 | Discharge: 2011-10-08 | Disposition: A | Discharge status: Home / Self Care | Payer: Federal, State, Local not specified - PPO | Source: Ambulatory Visit | Attending: Geriatric Medicine | Admitting: Geriatric Medicine

## 2011-10-08 DIAGNOSIS — Z1231 Encounter for screening mammogram for malignant neoplasm of breast: Secondary | ICD-10-CM

## 2012-06-30 ENCOUNTER — Other Ambulatory Visit: Payer: Self-pay | Admitting: Gastroenterology

## 2012-06-30 DIAGNOSIS — R109 Unspecified abdominal pain: Secondary | ICD-10-CM

## 2012-07-07 ENCOUNTER — Ambulatory Visit
Admission: RE | Admit: 2012-07-07 | Discharge: 2012-07-07 | Disposition: A | Discharge status: Home / Self Care | Payer: Federal, State, Local not specified - PPO | Source: Ambulatory Visit | Attending: Gastroenterology | Admitting: Gastroenterology

## 2012-07-07 DIAGNOSIS — R109 Unspecified abdominal pain: Secondary | ICD-10-CM

## 2012-07-14 ENCOUNTER — Other Ambulatory Visit: Payer: Self-pay | Admitting: Gastroenterology

## 2012-09-04 ENCOUNTER — Other Ambulatory Visit: Payer: Self-pay | Admitting: Geriatric Medicine

## 2012-09-04 DIAGNOSIS — Z1231 Encounter for screening mammogram for malignant neoplasm of breast: Secondary | ICD-10-CM

## 2012-09-20 DIAGNOSIS — K449 Diaphragmatic hernia without obstruction or gangrene: Secondary | ICD-10-CM

## 2012-09-20 HISTORY — PX: HERNIA REPAIR: SHX51

## 2012-09-20 HISTORY — DX: Diaphragmatic hernia without obstruction or gangrene: K44.9

## 2012-10-20 ENCOUNTER — Ambulatory Visit
Admission: RE | Admit: 2012-10-20 | Discharge: 2012-10-20 | Disposition: A | Discharge status: Home / Self Care | Payer: Federal, State, Local not specified - PPO | Source: Ambulatory Visit | Attending: Geriatric Medicine | Admitting: Geriatric Medicine

## 2012-10-20 DIAGNOSIS — Z1231 Encounter for screening mammogram for malignant neoplasm of breast: Secondary | ICD-10-CM

## 2013-10-04 ENCOUNTER — Other Ambulatory Visit: Payer: Self-pay

## 2013-10-04 DIAGNOSIS — Z1231 Encounter for screening mammogram for malignant neoplasm of breast: Secondary | ICD-10-CM

## 2013-10-22 ENCOUNTER — Ambulatory Visit
Admission: RE | Admit: 2013-10-22 | Discharge: 2013-10-22 | Disposition: A | Discharge status: Home / Self Care | Payer: Federal, State, Local not specified - PPO | Source: Ambulatory Visit

## 2013-10-22 DIAGNOSIS — Z1231 Encounter for screening mammogram for malignant neoplasm of breast: Secondary | ICD-10-CM

## 2014-02-12 ENCOUNTER — Encounter (INDEPENDENT_AMBULATORY_CARE_PROVIDER_SITE_OTHER): Payer: Self-pay

## 2014-02-14 ENCOUNTER — Ambulatory Visit (INDEPENDENT_AMBULATORY_CARE_PROVIDER_SITE_OTHER): Payer: Federal, State, Local not specified - PPO | Admitting: General Surgery

## 2014-02-14 ENCOUNTER — Encounter (INDEPENDENT_AMBULATORY_CARE_PROVIDER_SITE_OTHER): Payer: Self-pay | Admitting: General Surgery

## 2014-02-14 VITALS — BP 102/72 | HR 80 | Temp 98.3°F | Resp 16 | Ht 63.0 in | Wt 196.8 lb

## 2014-02-14 DIAGNOSIS — R143 Flatulence: Secondary | ICD-10-CM

## 2014-02-14 DIAGNOSIS — R1013 Epigastric pain: Secondary | ICD-10-CM

## 2014-02-14 DIAGNOSIS — R140 Abdominal distension (gaseous): Secondary | ICD-10-CM

## 2014-02-14 DIAGNOSIS — G8929 Other chronic pain: Secondary | ICD-10-CM

## 2014-02-14 DIAGNOSIS — R141 Gas pain: Secondary | ICD-10-CM

## 2014-02-14 DIAGNOSIS — R142 Eructation: Secondary | ICD-10-CM

## 2014-02-14 NOTE — Progress Notes (Signed)
Patient ID: JADAH BOBAK, female   DOB: 09-Jun-1966, 48 y.o.   MRN: [id number]  Chief Complaint  Patient presents with  . New Evaluation    eval GB issues    HPI Timea Breed Stites is a 48 y.o. female.   HPI  She is referred by Dr. Watt Climes for further evaluation and treatment of upper abdominal pain and gallstones.  She has had gallstones for many years. She's been having episodes of upper abdominal pain and bloating that become diffuse. This is eventually relieved after she has a explosive bowel movement.  This is then followed by constipation for a day or 2. This process can take approximately 2 hours. She has not noticed a relationship between food or activity. She has mild elevation of one of her transaminases. No family history of gallbladder disease. She was placed on probiotics and has not had an episode since that time.  Past Medical History  Diagnosis Date  . Cholelithiasis   . GERD (gastroesophageal reflux disease)   . B12 deficiency   . Osteopenia   . Anemia     Iron deficiency  . Depression   . Migraines     Past Surgical History  Procedure Laterality Date  . Colonoscopy  2011  . Esophagogastroduodenoscopy endoscopy    . Tubal ligation  1999  . Tonsillectomy    . Lasik  2008  . Abdominal hysterectomy  2009    anterior and posterior repair  . Hernia repair  2014    hiatal    Family History  Problem Relation Age of Onset  . Cancer Mother 16    breast     Social History History  Substance Use Topics  . Smoking status: Former Smoker    Quit date: 02/14/1998  . Smokeless tobacco: Never Used  . Alcohol Use: No    No Known Allergies  Current Outpatient Prescriptions  Medication Sig Dispense Refill  . aspirin-acetaminophen-caffeine (EXCEDRIN EXTRA STRENGTH) 250-250-65 MG per tablet Take 2 tablets by mouth every 6 (six) hours as needed for headache.      . Cyanocobalamin (VITAMIN B-12 IJ) Inject 1,000 mg as directed every 30 (thirty) days. Subqutaneous      .  esomeprazole (NEXIUM) 40 MG capsule Take 40 mg by mouth daily at 12 noon.      . hydrocortisone valerate cream (WESTCORT) 0.2 %       . Multiple Vitamin (MULTIVITAMIN) tablet Take 1 tablet by mouth daily.      . Nutritional Supplements (ESTROVEN PO) Take 1 tablet by mouth daily.      . tazarotene (TAZORAC) 0.05 % cream Apply 0.05 % topically at bedtime.       No current facility-administered medications for this visit.    Review of Systems Review of Systems  Constitutional: Negative.   HENT: Negative.   Respiratory: Negative.   Cardiovascular: Negative.   Gastrointestinal: Negative.   Genitourinary: Negative.   Neurological: Negative for headaches.  Hematological: Negative.     Blood pressure 102/72, pulse 80, temperature 98.3 F (36.8 C), temperature source Oral, resp. rate 16, height 5\' 3"  (1.6 m), weight 196 lb 12.8 oz (89.268 kg).  Physical Exam Physical Exam  Constitutional: No distress.  Overweight female.  HENT:  Head: Normocephalic and atraumatic.  Eyes: EOM are normal. No scleral icterus.  Cardiovascular: Normal rate and regular rhythm.   Pulmonary/Chest: Effort normal and breath sounds normal.  Abdominal: Soft. She exhibits no distension and no mass. There is no tenderness.  A  small subumbilical scar  Neurological: She is alert.  Skin: Skin is warm and dry.  Psychiatric: She has a normal mood and affect. Her behavior is normal.    Data Reviewed Dr. Perley Jain notes.  [place] report from 2013.  Assessment    Abdominal pain with bloating and relief of pain following an explosive bowel movement. Findings are a little atypical for gallbladder disease. They seemed more typical for irritable bowel disease.    Plan    I feel she would benefit from a trial of treatment for irritable bowel disease. If she continues to have symptoms despite this treatment, and I feel cholecystectomy would be a reasonable option. I gave her some literature on gallbladder disease and  cholecystectomy.        Rhunette Croft Everton Bertha 02/14/2014, 5:30 PM

## 2014-02-14 NOTE — Patient Instructions (Addendum)
This could be irritable bowel disease or gallbladder disease. I would recommend continuing the probiotic and getting on treatment for irritable bowel disease. If these symptoms persist despite the treatment, then I would recommend cholecystectomy.  I recommend a strict low fat diet.

## 2014-03-14 ENCOUNTER — Encounter (INDEPENDENT_AMBULATORY_CARE_PROVIDER_SITE_OTHER): Payer: Self-pay

## 2014-10-07 ENCOUNTER — Other Ambulatory Visit: Payer: Self-pay

## 2014-10-07 DIAGNOSIS — Z1231 Encounter for screening mammogram for malignant neoplasm of breast: Secondary | ICD-10-CM

## 2014-10-17 DIAGNOSIS — R809 Proteinuria, unspecified: Secondary | ICD-10-CM | POA: Insufficient documentation

## 2014-10-17 DIAGNOSIS — M533 Sacrococcygeal disorders, not elsewhere classified: Secondary | ICD-10-CM | POA: Insufficient documentation

## 2014-10-17 DIAGNOSIS — N816 Rectocele: Secondary | ICD-10-CM | POA: Insufficient documentation

## 2014-10-24 ENCOUNTER — Ambulatory Visit
Admission: RE | Admit: 2014-10-24 | Discharge: 2014-10-24 | Disposition: A | Discharge status: Home / Self Care | Payer: Federal, State, Local not specified - PPO | Source: Ambulatory Visit

## 2014-10-24 ENCOUNTER — Encounter (INDEPENDENT_AMBULATORY_CARE_PROVIDER_SITE_OTHER): Payer: Self-pay

## 2014-10-24 DIAGNOSIS — Z1231 Encounter for screening mammogram for malignant neoplasm of breast: Secondary | ICD-10-CM

## 2014-10-28 ENCOUNTER — Other Ambulatory Visit: Payer: Self-pay | Admitting: Geriatric Medicine

## 2014-10-28 DIAGNOSIS — R928 Other abnormal and inconclusive findings on diagnostic imaging of breast: Secondary | ICD-10-CM

## 2014-10-31 DIAGNOSIS — N8111 Cystocele, midline: Secondary | ICD-10-CM | POA: Insufficient documentation

## 2014-10-31 DIAGNOSIS — N810 Urethrocele: Secondary | ICD-10-CM | POA: Insufficient documentation

## 2014-11-06 ENCOUNTER — Ambulatory Visit
Admission: RE | Admit: 2014-11-06 | Discharge: 2014-11-06 | Disposition: A | Discharge status: Home / Self Care | Payer: Federal, State, Local not specified - PPO | Source: Ambulatory Visit | Attending: Geriatric Medicine | Admitting: Geriatric Medicine

## 2014-11-06 DIAGNOSIS — R928 Other abnormal and inconclusive findings on diagnostic imaging of breast: Secondary | ICD-10-CM

## 2015-04-01 ENCOUNTER — Other Ambulatory Visit: Payer: Self-pay | Admitting: Geriatric Medicine

## 2015-04-01 DIAGNOSIS — Z803 Family history of malignant neoplasm of breast: Secondary | ICD-10-CM

## 2015-04-01 DIAGNOSIS — N6320 Unspecified lump in the left breast, unspecified quadrant: Secondary | ICD-10-CM

## 2015-05-08 ENCOUNTER — Ambulatory Visit
Admission: RE | Admit: 2015-05-08 | Discharge: 2015-05-08 | Disposition: A | Discharge status: Home / Self Care | Payer: Federal, State, Local not specified - PPO | Source: Ambulatory Visit | Attending: Geriatric Medicine | Admitting: Geriatric Medicine

## 2015-05-08 DIAGNOSIS — N6320 Unspecified lump in the left breast, unspecified quadrant: Secondary | ICD-10-CM

## 2015-05-08 DIAGNOSIS — Z803 Family history of malignant neoplasm of breast: Secondary | ICD-10-CM

## 2015-05-09 ENCOUNTER — Other Ambulatory Visit: Payer: Federal, State, Local not specified - PPO

## 2015-09-21 DIAGNOSIS — M722 Plantar fascial fibromatosis: Secondary | ICD-10-CM

## 2015-09-21 HISTORY — DX: Plantar fascial fibromatosis: M72.2

## 2015-10-07 ENCOUNTER — Other Ambulatory Visit: Payer: Self-pay | Admitting: Geriatric Medicine

## 2015-10-07 DIAGNOSIS — N6002 Solitary cyst of left breast: Secondary | ICD-10-CM

## 2015-11-06 ENCOUNTER — Ambulatory Visit
Admission: RE | Admit: 2015-11-06 | Discharge: 2015-11-06 | Disposition: A | Discharge status: Home / Self Care | Payer: Federal, State, Local not specified - PPO | Source: Ambulatory Visit | Attending: Geriatric Medicine | Admitting: Geriatric Medicine

## 2015-11-06 DIAGNOSIS — N6002 Solitary cyst of left breast: Secondary | ICD-10-CM

## 2016-01-16 DIAGNOSIS — Z0000 Encounter for general adult medical examination without abnormal findings: Secondary | ICD-10-CM | POA: Diagnosis not present

## 2016-01-16 DIAGNOSIS — Z79899 Other long term (current) drug therapy: Secondary | ICD-10-CM | POA: Diagnosis not present

## 2016-01-16 DIAGNOSIS — E538 Deficiency of other specified B group vitamins: Secondary | ICD-10-CM | POA: Diagnosis not present

## 2016-02-19 DIAGNOSIS — R945 Abnormal results of liver function studies: Secondary | ICD-10-CM | POA: Diagnosis not present

## 2016-03-04 DIAGNOSIS — D225 Melanocytic nevi of trunk: Secondary | ICD-10-CM | POA: Diagnosis not present

## 2016-03-04 DIAGNOSIS — R208 Other disturbances of skin sensation: Secondary | ICD-10-CM | POA: Diagnosis not present

## 2016-03-12 DIAGNOSIS — M79672 Pain in left foot: Secondary | ICD-10-CM | POA: Diagnosis not present

## 2016-03-12 DIAGNOSIS — M79671 Pain in right foot: Secondary | ICD-10-CM | POA: Diagnosis not present

## 2016-03-25 DIAGNOSIS — M722 Plantar fascial fibromatosis: Secondary | ICD-10-CM | POA: Diagnosis not present

## 2016-03-26 ENCOUNTER — Ambulatory Visit: Payer: Federal, State, Local not specified - PPO | Admitting: Podiatry

## 2016-03-29 DIAGNOSIS — M722 Plantar fascial fibromatosis: Secondary | ICD-10-CM | POA: Diagnosis not present

## 2016-04-01 DIAGNOSIS — M722 Plantar fascial fibromatosis: Secondary | ICD-10-CM | POA: Diagnosis not present

## 2016-04-05 DIAGNOSIS — M722 Plantar fascial fibromatosis: Secondary | ICD-10-CM | POA: Diagnosis not present

## 2016-04-07 DIAGNOSIS — M722 Plantar fascial fibromatosis: Secondary | ICD-10-CM | POA: Diagnosis not present

## 2016-04-12 DIAGNOSIS — M722 Plantar fascial fibromatosis: Secondary | ICD-10-CM | POA: Diagnosis not present

## 2016-04-15 DIAGNOSIS — M722 Plantar fascial fibromatosis: Secondary | ICD-10-CM | POA: Diagnosis not present

## 2016-04-23 DIAGNOSIS — M79672 Pain in left foot: Secondary | ICD-10-CM | POA: Diagnosis not present

## 2016-04-23 DIAGNOSIS — M79671 Pain in right foot: Secondary | ICD-10-CM | POA: Diagnosis not present

## 2016-04-23 DIAGNOSIS — M722 Plantar fascial fibromatosis: Secondary | ICD-10-CM | POA: Diagnosis not present

## 2016-05-25 ENCOUNTER — Ambulatory Visit (INDEPENDENT_AMBULATORY_CARE_PROVIDER_SITE_OTHER): Payer: Federal, State, Local not specified - PPO | Admitting: Podiatry

## 2016-05-25 ENCOUNTER — Ambulatory Visit (INDEPENDENT_AMBULATORY_CARE_PROVIDER_SITE_OTHER): Payer: Federal, State, Local not specified - PPO

## 2016-05-25 ENCOUNTER — Encounter: Payer: Self-pay | Admitting: Podiatry

## 2016-05-25 DIAGNOSIS — M722 Plantar fascial fibromatosis: Secondary | ICD-10-CM

## 2016-05-25 MED ORDER — MELOXICAM 15 MG PO TABS
15.0000 mg | ORAL_TABLET | Freq: Every day | ORAL | 3 refills | Status: DC
Start: 2016-05-25 — End: 2017-01-20

## 2016-05-25 MED ORDER — METHYLPREDNISOLONE 4 MG PO TBPK: ORAL_TABLET | ORAL | 0 refills | Status: DC

## 2016-05-25 NOTE — Progress Notes (Signed)
   Subjective:    Patient ID: Sabrina Mcdaniel, female    DOB: [birth year], 50 y.o.   MRN: [id number]  HPI: She presents today with a chief complaint of plantar heel pain 1 year or more. She states that she has sharp sensations in the past few months that shoot into the back of the leg andof the Achilles feels sore Sunday's also. Mornings are particularly bad for plantar heel pain she saw an orthopedic surgeon who did x-rays stated that she had heel spurs and send her to physical therapy for stretching. She massages her calf as well as her foot and wears custom orthotics which have not helped.    Review of Systems  Musculoskeletal: Positive for gait problem.  All other systems reviewed and are negative.      Objective:   Physical Exam: Vital signs are stable alert and oriented 3. Pulses are strongly palpable. Neurologic sensorium is intact. Deep tendon reflexes are intact muscle strength is 5 over 5 dorsiflexion plantar flexors and inverters and everters all entrance musculature is intact. Orthopedic evaluation demonstrates pain on palpation medial calcaneal tubercle of the left heel. I reviewed radiographs that she brought with her today which does demonstrate a soft tissue increase in density at the plantar fascia calcaneal insertion site of the heel. Very small old plantar distally oriented calcaneal heel spur is present. Cutaneous evaluation of straight supple well-hydrated cutis no erythema edema cellulitis drainage or odor.        Assessment & Plan:  Assessment: Chronic intractable plantar fasciitis left heel. There are plan: I injected the left heel today with Kenalog and local anesthetic dispensed plantar fascia's. She already has a plantar fascia night splint at home. We discussed appropriate shoe gear stretching exercises and ice therapy. I also placed her on a steroid Dosepak to be followed by meloxicam. I'll follow-up with her in 1 month to reevaluate her progress.

## 2016-05-25 NOTE — Patient Instructions (Signed)

## 2016-06-24 ENCOUNTER — Ambulatory Visit (INDEPENDENT_AMBULATORY_CARE_PROVIDER_SITE_OTHER): Payer: Federal, State, Local not specified - PPO | Admitting: Podiatry

## 2016-06-24 DIAGNOSIS — M722 Plantar fascial fibromatosis: Secondary | ICD-10-CM

## 2016-06-24 NOTE — Progress Notes (Signed)
She presents today for follow-up of her plantar fasciitis left heel. She states that she is 100% better after 1 year she can't believe how quickly we got her better. She states that she continues to wear the plantar fascia brace the night splint and take meloxicam.  Objective: Vital signs are stable she's alert and oriented 3. Pulses are palpable. She is pain on palpation medial tubercle of the left heel. The very very minimal no signs of infection. No open lesions or wounds. No calf pain.  Assessment: Near 100% resolution plantar fasciitis left foot.  Plan: I encouraged her to continue all conservative therapies 1 more month. Should she regress orthotics will be necessary.

## 2016-08-24 ENCOUNTER — Ambulatory Visit (INDEPENDENT_AMBULATORY_CARE_PROVIDER_SITE_OTHER): Payer: Federal, State, Local not specified - PPO

## 2016-08-24 ENCOUNTER — Ambulatory Visit (INDEPENDENT_AMBULATORY_CARE_PROVIDER_SITE_OTHER): Payer: Federal, State, Local not specified - PPO | Admitting: Podiatry

## 2016-08-24 ENCOUNTER — Encounter: Payer: Self-pay | Admitting: Podiatry

## 2016-08-24 VITALS — BP 122/77 | HR 66 | Resp 16

## 2016-08-24 DIAGNOSIS — M79671 Pain in right foot: Secondary | ICD-10-CM | POA: Diagnosis not present

## 2016-08-24 DIAGNOSIS — M722 Plantar fascial fibromatosis: Secondary | ICD-10-CM | POA: Diagnosis not present

## 2016-08-24 MED ORDER — MELOXICAM 15 MG PO TABS
15.0000 mg | ORAL_TABLET | Freq: Every day | ORAL | 3 refills | Status: DC
Start: 2016-08-24 — End: 2017-01-20

## 2016-08-25 NOTE — Progress Notes (Signed)
She presents today for follow-up of her plantar fasciitis to her right heel. She states that it is extreme and painful radiating up into her calf. She also needs a refill on her meloxicam.  Objective: Vital signs are stable alert and oriented 3 severe pain on palpation mucogingival of her right heel. No calf pain. No swelling negative Thompson's test. Negative Homans sign.  Assessment: Chronic intractable plantar fasciitis bilateral. Right greater than left  Plan: Injected the bilateral heels today with Kenalog and local anesthetic placed her left foot in a cam walker. Follow up with her in the near future and consider surgical intervention if necessary.

## 2016-08-27 ENCOUNTER — Other Ambulatory Visit: Payer: Self-pay | Admitting: Internal Medicine

## 2016-08-27 ENCOUNTER — Ambulatory Visit
Admission: RE | Admit: 2016-08-27 | Discharge: 2016-08-27 | Disposition: A | Discharge status: Home / Self Care | Payer: Federal, State, Local not specified - PPO | Source: Ambulatory Visit | Attending: Internal Medicine | Admitting: Internal Medicine

## 2016-08-27 DIAGNOSIS — M19072 Primary osteoarthritis, left ankle and foot: Secondary | ICD-10-CM | POA: Diagnosis not present

## 2016-08-27 DIAGNOSIS — R700 Elevated erythrocyte sedimentation rate: Secondary | ICD-10-CM | POA: Diagnosis not present

## 2016-08-27 DIAGNOSIS — M1990 Unspecified osteoarthritis, unspecified site: Secondary | ICD-10-CM

## 2016-08-27 DIAGNOSIS — M19071 Primary osteoarthritis, right ankle and foot: Secondary | ICD-10-CM | POA: Diagnosis not present

## 2016-08-27 DIAGNOSIS — K219 Gastro-esophageal reflux disease without esophagitis: Secondary | ICD-10-CM | POA: Diagnosis not present

## 2016-08-27 DIAGNOSIS — E538 Deficiency of other specified B group vitamins: Secondary | ICD-10-CM | POA: Diagnosis not present

## 2016-08-27 DIAGNOSIS — R740 Nonspecific elevation of levels of transaminase and lactic acid dehydrogenase [LDH]: Secondary | ICD-10-CM | POA: Diagnosis not present

## 2016-08-27 DIAGNOSIS — R600 Localized edema: Secondary | ICD-10-CM | POA: Diagnosis not present

## 2016-08-29 ENCOUNTER — Emergency Department (HOSPITAL_BASED_OUTPATIENT_CLINIC_OR_DEPARTMENT_OTHER)
Admission: EM | Admit: 2016-08-29 | Discharge: 2016-08-29 | Disposition: A | Discharge status: Home / Self Care | Payer: Federal, State, Local not specified - PPO | Attending: Emergency Medicine | Admitting: Emergency Medicine

## 2016-08-29 ENCOUNTER — Emergency Department (HOSPITAL_BASED_OUTPATIENT_CLINIC_OR_DEPARTMENT_OTHER): Payer: Federal, State, Local not specified - PPO

## 2016-08-29 ENCOUNTER — Encounter (HOSPITAL_BASED_OUTPATIENT_CLINIC_OR_DEPARTMENT_OTHER): Payer: Self-pay | Admitting: Emergency Medicine

## 2016-08-29 DIAGNOSIS — Z87891 Personal history of nicotine dependence: Secondary | ICD-10-CM | POA: Diagnosis not present

## 2016-08-29 DIAGNOSIS — Z791 Long term (current) use of non-steroidal anti-inflammatories (NSAID): Secondary | ICD-10-CM | POA: Insufficient documentation

## 2016-08-29 DIAGNOSIS — R109 Unspecified abdominal pain: Secondary | ICD-10-CM | POA: Diagnosis present

## 2016-08-29 DIAGNOSIS — N132 Hydronephrosis with renal and ureteral calculous obstruction: Secondary | ICD-10-CM | POA: Diagnosis not present

## 2016-08-29 DIAGNOSIS — N201 Calculus of ureter: Secondary | ICD-10-CM | POA: Diagnosis not present

## 2016-08-29 DIAGNOSIS — Z7982 Long term (current) use of aspirin: Secondary | ICD-10-CM | POA: Diagnosis not present

## 2016-08-29 LAB — CBC
HEMATOCRIT: 36.6 % (ref 36.0–46.0)
HEMOGLOBIN: 12 g/dL (ref 12.0–15.0)
MCH: 27.8 pg (ref 26.0–34.0)
MCHC: 32.8 g/dL (ref 30.0–36.0)
MCV: 84.9 fL (ref 78.0–100.0)
Platelets: 252 10*3/uL (ref 150–400)
RBC: 4.31 MIL/uL (ref 3.87–5.11)
RDW: 14 % (ref 11.5–15.5)
WBC: 10.5 10*3/uL (ref 4.0–10.5)

## 2016-08-29 LAB — URINALYSIS, ROUTINE W REFLEX MICROSCOPIC
Glucose, UA: NEGATIVE mg/dL
KETONES UR: NEGATIVE mg/dL
Leukocytes, UA: NEGATIVE
NITRITE: NEGATIVE
PROTEIN: 30 mg/dL — AB
SPECIFIC GRAVITY, URINE: 1.028 (ref 1.005–1.030)
pH: 6 (ref 5.0–8.0)

## 2016-08-29 LAB — URINALYSIS, MICROSCOPIC (REFLEX)

## 2016-08-29 LAB — BASIC METABOLIC PANEL
ANION GAP: 9 (ref 5–15)
BUN: 23 mg/dL — ABNORMAL HIGH (ref 6–20)
CHLORIDE: 106 mmol/L (ref 101–111)
CO2: 26 mmol/L (ref 22–32)
Calcium: 9.7 mg/dL (ref 8.9–10.3)
Creatinine, Ser: 1.12 mg/dL — ABNORMAL HIGH (ref 0.44–1.00)
GFR calc Af Amer: >60 mL/min (ref >60)
GFR, EST NON AFRICAN AMERICAN: 56 mL/min — AB (ref >60)
Glucose, Bld: 125 mg/dL — ABNORMAL HIGH (ref 65–99)
POTASSIUM: 3.7 mmol/L (ref 3.5–5.1)
SODIUM: 141 mmol/L (ref 135–145)

## 2016-08-29 MED ORDER — KETOROLAC TROMETHAMINE 30 MG/ML IJ SOLN: 15.0000 mg | Freq: Once | INTRAMUSCULAR | Status: DC

## 2016-08-29 MED ORDER — MORPHINE SULFATE (PF) 4 MG/ML IV SOLN
4.0000 mg | Freq: Once | INTRAVENOUS | Status: AC
Start: 2016-08-29 — End: 2016-08-29
  Administered 2016-08-29: 4 mg via INTRAVENOUS
  Filled 2016-08-29: qty 1

## 2016-08-29 MED ORDER — SODIUM CHLORIDE 0.9 % IV BOLUS (SEPSIS)
500.0000 mL | Freq: Once | INTRAVENOUS | Status: AC
  Administered 2016-08-29: 500 mL via INTRAVENOUS

## 2016-08-29 MED ORDER — ONDANSETRON HCL 4 MG/2ML IJ SOLN
4.0000 mg | Freq: Once | INTRAMUSCULAR | Status: AC
  Administered 2016-08-29: 4 mg via INTRAVENOUS
  Filled 2016-08-29: qty 2

## 2016-08-29 MED ORDER — HYDROCODONE-ACETAMINOPHEN 5-325 MG PO TABS: 1.0000 | ORAL_TABLET | Freq: Four times a day (QID) | ORAL | 0 refills | Status: DC | PRN

## 2016-08-29 MED ORDER — HYDROMORPHONE HCL 1 MG/ML IJ SOLN
0.5000 mg | Freq: Once | INTRAMUSCULAR | Status: AC
  Administered 2016-08-29: 0.5 mg via INTRAVENOUS
  Filled 2016-08-29: qty 1

## 2016-08-29 MED ORDER — TAMSULOSIN HCL 0.4 MG PO CAPS: 0.4000 mg | ORAL_CAPSULE | Freq: Once | ORAL | Status: DC

## 2016-08-29 MED ORDER — KETOROLAC TROMETHAMINE 30 MG/ML IJ SOLN
15.0000 mg | Freq: Once | INTRAMUSCULAR | Status: AC
  Administered 2016-08-29: 15 mg via INTRAVENOUS
  Filled 2016-08-29: qty 1

## 2016-08-29 MED ORDER — FENTANYL CITRATE (PF) 100 MCG/2ML IJ SOLN
50.0000 ug | INTRAMUSCULAR | Status: DC | PRN
  Administered 2016-08-29: 50 ug via INTRAVENOUS
  Filled 2016-08-29: qty 2

## 2016-08-29 MED ORDER — TAMSULOSIN HCL 0.4 MG PO CAPS: 0.4000 mg | ORAL_CAPSULE | Freq: Every day | ORAL | 0 refills | Status: DC

## 2016-08-29 MED ORDER — SODIUM CHLORIDE 0.9 % IV BOLUS (SEPSIS)
1000.0000 mL | Freq: Once | INTRAVENOUS | Status: AC
  Administered 2016-08-29: 1000 mL via INTRAVENOUS

## 2016-08-29 MED ORDER — ONDANSETRON 8 MG PO TBDP: 8.0000 mg | ORAL_TABLET | Freq: Three times a day (TID) | ORAL | 0 refills | Status: DC | PRN

## 2016-08-29 MED ORDER — TAMSULOSIN HCL 0.4 MG PO CAPS
0.4000 mg | ORAL_CAPSULE | Freq: Once | ORAL | Status: AC
  Administered 2016-08-29: 0.4 mg via ORAL
  Filled 2016-08-29: qty 1

## 2016-08-29 MED ORDER — PROMETHAZINE HCL 25 MG/ML IJ SOLN
12.5000 mg | Freq: Once | INTRAMUSCULAR | Status: AC
  Administered 2016-08-29: 12.5 mg via INTRAVENOUS
  Filled 2016-08-29: qty 1

## 2016-08-29 NOTE — ED Notes (Signed)
Pt c/o pain returning to a 10/10. EDP notified. No new orders received and EDP will update pt as soon as he can.

## 2016-08-29 NOTE — ED Triage Notes (Signed)
R flank pain  X 2 hours with urinary frequency.

## 2016-08-29 NOTE — ED Notes (Signed)
Dr.Knapp at bedside to evaluate pt.  

## 2016-08-29 NOTE — ED Notes (Signed)
ED Provider at bedside. 

## 2016-08-29 NOTE — ED Provider Notes (Signed)
[facility] Provider Note   CSN: [id number] Arrival date & time: 08/29/16  1511  By signing my name below, I, Reola Mosher, attest that this documentation has been prepared under the direction and in the presence of Ocie Cornfield, PA-C.  Electronically Signed: Reola Mosher, ED Scribe. 08/29/16. 4:29 PM.  History   Chief Complaint Chief Complaint  Patient presents with  . Flank Pain   The history is provided by the patient. No language interpreter was used.    HPI Comments: Sabrina Mcdaniel is a 50 y.o. female with a PMHx of anemia, who presents to the Emergency Department complaining of sudden onset, gradually worsening right sided flank pain onset approximately 2 hours ago. Pt notes that her pain is constant and is sharp/spasmotic in quality. She also reports that her pain is now radiating forwards into her right-sided abdomen. Pt notes associated urinary frequency, nausea, and one episode of pink hematuria since the onset of her flank pain. No recent trauma or injury to the area. No h/o renal calculi. No treatments for her pain were tried prior to coming into the ED. She denies urgency, diarrhea, bloody stools, vomiting, fevers, vaginal bleeding, vaginal discharge,  or any other associated symptoms.   Past Medical History:  Diagnosis Date  . Anemia    Iron deficiency  . B12 deficiency   . Cholelithiasis   . Depression   . GERD (gastroesophageal reflux disease)   . Migraines   . Osteopenia    Patient Active Problem List   Diagnosis Date Noted  . Abdominal pain, chronic, epigastric 02/14/2014  . Abdominal bloating 02/14/2014  . OTHER CONSTIPATION 01/13/2010   Past Surgical History:  Procedure Laterality Date  . ABDOMINAL HYSTERECTOMY  2009   anterior and posterior repair  . COLONOSCOPY  2011  . ESOPHAGOGASTRODUODENOSCOPY ENDOSCOPY    . HERNIA REPAIR  2014   hiatal  . LASIK  2008  . TONSILLECTOMY    . TUBAL LIGATION  1999   OB History    No data available     Home Medications    Prior to Admission medications   Medication Sig Start Date End Date Taking? Authorizing Provider  aspirin-acetaminophen-caffeine (EXCEDRIN MIGRAINE) [phone] MG tablet Take by mouth every 6 (six) hours as needed for headache.   Yes Historical Provider, MD  esomeprazole (NEXIUM) 20 MG capsule Take 20 mg by mouth daily at 12 noon.   Yes Historical Provider, MD  famotidine (PEPCID) 10 MG tablet Take 10 mg by mouth 2 (two) times daily.   Yes Historical Provider, MD  meloxicam (MOBIC) 15 MG tablet Take 1 tablet (15 mg total) by mouth daily. 05/25/16  Yes Max T Hyatt, DPM  meloxicam (MOBIC) 15 MG tablet Take 1 tablet (15 mg total) by mouth daily. 08/24/16  Yes Max T Hyatt, DPM  Multiple Vitamin (MULTIVITAMIN) tablet Take 1 tablet by mouth daily.   Yes Historical Provider, MD  Probiotic Product (PROBIOTIC DAILY PO) Take by mouth.   Yes Historical Provider, MD   Family History Family History  Problem Relation Age of Onset  . Cancer Mother 70    breast    Social History Social History  Substance Use Topics  . Smoking status: Former Smoker    Quit date: 02/14/1998  . Smokeless tobacco: Never Used  . Alcohol use No   Allergies   Patient has no known allergies.  Review of Systems Review of Systems  Constitutional: Negative for fever.  Gastrointestinal: Positive for abdominal pain  and nausea. Negative for blood in stool, diarrhea and vomiting.  Genitourinary: Positive for flank pain, frequency and hematuria. Negative for urgency, vaginal bleeding and vaginal discharge.   Physical Exam Updated Vital Signs BP 149/89 (BP Location: Right Arm)   Pulse 64   Temp 97.8 F (36.6 C) (Oral)   Resp 24   Ht 5\' 3"  (1.6 m)   Wt 185 lb (83.9 kg)   SpO2 100%   BMI 32.77 kg/m   Physical Exam  Constitutional: She appears well-developed and well-nourished.  Pt appears mildly distressed due to pain. She is rolling around in bed on exam.   HENT:  Head:  Normocephalic and atraumatic.  Eyes: Conjunctivae are normal.  Neck: Normal range of motion.  Cardiovascular: Normal rate, regular rhythm, normal heart sounds and intact distal pulses.  Exam reveals no gallop and no friction rub.   No murmur heard. Pulmonary/Chest: Effort normal and breath sounds normal. No respiratory distress. She has no wheezes. She has no rales.  Abdominal: Soft. She exhibits no distension. There is no tenderness. There is CVA tenderness (right). There is no rebound and no guarding.  Musculoskeletal: Normal range of motion.  Neurological: She is alert.  Skin: Skin is warm and dry. Capillary refill takes less than 2 seconds. No pallor.  Psychiatric: She has a normal mood and affect. Her behavior is normal.  Nursing note and vitals reviewed.  ED Treatments / Results  DIAGNOSTIC STUDIES: Oxygen Saturation is 100% on RA, normal by my interpretation.   COORDINATION OF CARE: 4:10 PM-Discussed next steps with pt. Pt verbalized understanding and is agreeable with the plan.   Labs (all labs ordered are listed, but only abnormal results are displayed) Labs Reviewed  URINE CULTURE - Abnormal; Notable for the following:       Result Value   Culture MULTIPLE SPECIES PRESENT, SUGGEST RECOLLECTION (*)    All other components within normal limits  URINALYSIS, ROUTINE W REFLEX MICROSCOPIC - Abnormal; Notable for the following:    APPearance CLOUDY (*)    Hgb urine dipstick LARGE (*)    Bilirubin Urine SMALL (*)    Protein, ur 30 (*)    All other components within normal limits  BASIC METABOLIC PANEL - Abnormal; Notable for the following:    Glucose, Bld 125 (*)    BUN 23 (*)    Creatinine, Ser 1.12 (*)    GFR calc non Af Amer 56 (*)    All other components within normal limits  URINALYSIS, MICROSCOPIC (REFLEX) - Abnormal; Notable for the following:    Bacteria, UA MANY (*)    Squamous Epithelial / LPF 0-5 (*)    All other components within normal limits  CBC   EKG   EKG Interpretation None      Radiology No results found.  Procedures Procedures   Medications Ordered in ED Medications  ondansetron (ZOFRAN) injection 4 mg (4 mg Intravenous Given 08/29/16 1554)  morphine 4 MG/ML injection 4 mg (4 mg Intravenous Given 08/29/16 1633)  morphine 4 MG/ML injection 4 mg (4 mg Intravenous Given 08/29/16 1700)  sodium chloride 0.9 % bolus 1,000 mL (0 mLs Intravenous Stopped 08/29/16 1851)  ketorolac (TORADOL) 30 MG/ML injection 15 mg (15 mg Intravenous Given 08/29/16 1801)  tamsulosin (FLOMAX) capsule 0.4 mg (0.4 mg Oral Given 08/29/16 1807)  HYDROmorphone (DILAUDID) injection 0.5 mg (0.5 mg Intravenous Given 08/29/16 1805)  sodium chloride 0.9 % bolus 500 mL (0 mLs Intravenous Stopped 08/29/16 1931)  ondansetron (ZOFRAN) injection 4  mg (4 mg Intravenous Given 08/29/16 1938)  promethazine (PHENERGAN) injection 12.5 mg (12.5 mg Intravenous Given 08/29/16 2213)   Initial Impression / Assessment and Plan / ED Course  I have reviewed the triage vital signs and the nursing notes.  Pertinent labs & imaging results that were available during my care of the patient were reviewed by me and considered in my medical decision making (see chart for details).  Clinical Course   Patient presented to the ED with right flank pain, nausea, emesis onset pta. Exam consistent with CVA tenderness on the right. Abdominal exam was benign. Labs without any leukocytosis. Mild elevated creatinine at 1.1 likely due to dehydration. Patient is afebrile. Patient was given initial dose of Zofran and morphine. However was unable to control her pain. She was given a second dose of morphine in order to perform CAT scan. CAT scan revealed a right distal ureteral stone measuring 4.7 mm. Mild hydronephrosis was noted. CAT scan also revealed gastric distention. Patient without any epigastric pain. Unclear etiology patient informed of findings. Patient continued to be in severe pain. She was given  0.5 of Dilaudid along with Toradol. Patient became mildly hypotensive, bradycardic and hypoxic likely due to pain medicine.. She had several episodes of nonbilious nonbloody emesis after trying by mouth fluid challenge. She was given 2 L of IV fluids along with dose of Phenergan. Dr. Tomi Bamberger evaluated patient and agrees to continue with IV fluids and observation. On reassessment patient is awake alert and oriented. She is maintaining her oxygen saturations above 95% on room air. Her pallor has resolved at discharge. BP have remained stable. She is in no pain and has been able to challenge by mouth fluids. I feel the patient is hemodynamically stable this time. She feels stable for discharge. I discussed the patient with Dr. Tomi Bamberger who agrees with the above plan. Patient will be given prescription for Flomax, Phenergan, pain medicine. I have given her referral to urology. Patient will likely pass stone given that is 4.7 mm. She had a few bacteria in her urine however no nitrites or leukocytes. Patient has no urinary symptoms. Will not treat at this time. Cultures are pending. Pt is hemodynamically stable, in NAD, & able to ambulate in the ED. Pain has been managed & has no complaints prior to dc. Pt is comfortable with above plan and is stable for discharge at this time. All questions were answered prior to disposition. Strict return precautions for f/u to the ED were discussed.      Final Clinical Impressions(s) / ED Diagnoses   Final diagnoses:  Ureterolithiasis   New Prescriptions Discharge Medication List as of 08/29/2016 11:05 PM    START taking these medications   Details  HYDROcodone-acetaminophen (NORCO/VICODIN) 5-325 MG tablet Take 1-2 tablets by mouth every 6 (six) hours as needed., Starting Sun 08/29/2016, Print    ondansetron (ZOFRAN-ODT) 8 MG disintegrating tablet Take 1 tablet (8 mg total) by mouth every 8 (eight) hours as needed for nausea., Starting Sun 08/29/2016, Print    tamsulosin  (FLOMAX) 0.4 MG CAPS capsule Take 1 capsule (0.4 mg total) by mouth daily., Starting Sun 08/29/2016, Print       I personally performed the services described in this documentation, which was scribed in my presence. The recorded information has been reviewed and is accurate.     Doristine Devoid, PA-C 09/01/16 [place], MD 09/01/16 [phone]

## 2016-08-29 NOTE — ED Notes (Signed)
Pt teaching provided on medications that may cause drowsiness. Pt instructed not to drive or operate heavy machinery while taking the prescribed medication. Pt verbalized understanding.   

## 2016-08-29 NOTE — Discharge Instructions (Signed)
Please take Norco for pain. You may also take the zofran for nausea. You need to take the flomax once a day. Drink plenty of fluids to stay hydrated. Follow up with [facility] tomorrow. I cultured your urine and will call if need for antibiotics. Return to the Ed if your symptoms worsen.

## 2016-08-29 NOTE — ED Notes (Signed)
Pt vomited again, sitting up in bed, c/o ongoing nausea.

## 2016-08-29 NOTE — ED Notes (Signed)
Pt more awake and alert now, maintaining SpO2, N/V is controlled. Pt's pallor is resolving at discharge. Pt d/c home with husband.

## 2016-08-31 LAB — URINE CULTURE

## 2016-09-01 DIAGNOSIS — N201 Calculus of ureter: Secondary | ICD-10-CM | POA: Diagnosis not present

## 2016-09-02 ENCOUNTER — Other Ambulatory Visit: Payer: Self-pay | Admitting: Urology

## 2016-09-03 DIAGNOSIS — R768 Other specified abnormal immunological findings in serum: Secondary | ICD-10-CM | POA: Diagnosis not present

## 2016-09-03 DIAGNOSIS — N200 Calculus of kidney: Secondary | ICD-10-CM | POA: Diagnosis not present

## 2016-09-03 DIAGNOSIS — E538 Deficiency of other specified B group vitamins: Secondary | ICD-10-CM | POA: Diagnosis not present

## 2016-09-20 HISTORY — PX: PLANTAR FASCIA SURGERY: SHX746

## 2016-09-22 DIAGNOSIS — M79672 Pain in left foot: Secondary | ICD-10-CM | POA: Diagnosis not present

## 2016-09-27 DIAGNOSIS — M79672 Pain in left foot: Secondary | ICD-10-CM | POA: Diagnosis not present

## 2016-09-27 DIAGNOSIS — R350 Frequency of micturition: Secondary | ICD-10-CM | POA: Diagnosis not present

## 2016-09-27 DIAGNOSIS — M1990 Unspecified osteoarthritis, unspecified site: Secondary | ICD-10-CM | POA: Diagnosis not present

## 2016-09-30 ENCOUNTER — Ambulatory Visit (HOSPITAL_COMMUNITY)
Admission: RE | Admit: 2016-09-30 | Payer: Federal, State, Local not specified - PPO | Source: Ambulatory Visit | Admitting: Urology

## 2016-09-30 ENCOUNTER — Encounter (HOSPITAL_COMMUNITY): Admission: RE | Payer: Self-pay | Source: Ambulatory Visit

## 2016-09-30 SURGERY — LITHOTRIPSY, ESWL
Anesthesia: LOCAL | Laterality: Right

## 2016-10-07 ENCOUNTER — Ambulatory Visit: Payer: Federal, State, Local not specified - PPO | Admitting: Podiatry

## 2016-10-14 ENCOUNTER — Ambulatory Visit (INDEPENDENT_AMBULATORY_CARE_PROVIDER_SITE_OTHER): Payer: Federal, State, Local not specified - PPO | Admitting: Podiatry

## 2016-10-14 ENCOUNTER — Encounter: Payer: Self-pay | Admitting: Podiatry

## 2016-10-14 DIAGNOSIS — M722 Plantar fascial fibromatosis: Secondary | ICD-10-CM | POA: Diagnosis not present

## 2016-10-14 NOTE — Patient Instructions (Signed)
Pre-Operative Instructions  Congratulations, you have decided to take an important step to improving your quality of life.  You can be assured that the doctors of [facility] will be with you every step of the way.  1. Plan to be at the surgery center/hospital at least 1 (one) hour prior to your scheduled time unless otherwise directed by the surgical center/hospital staff.  You must have a responsible adult accompany you, remain during the surgery and drive you home.  Make sure you have directions to the surgical center/hospital and know how to get there on time. 2. For hospital based surgery you will need to obtain a history and physical form from your family physician within 1 month prior to the date of surgery- we will give you a form for you primary physician.  3. We make every effort to accommodate the date you request for surgery.  There are however, times where surgery dates or times have to be moved.  We will contact you as soon as possible if a change in schedule is required.   4. No Aspirin/Ibuprofen for one week before surgery.  If you are on aspirin, any non-steroidal anti-inflammatory medications (Mobic, Aleve, Ibuprofen) you should stop taking it 7 days prior to your surgery.  You make take Tylenol  For pain prior to surgery.  5. Medications- If you are taking daily heart and blood pressure medications, seizure, reflux, allergy, asthma, anxiety, pain or diabetes medications, make sure the surgery center/hospital is aware before the day of surgery so they may notify you which medications to take or avoid the day of surgery. 6. No food or drink after midnight the night before surgery unless directed otherwise by surgical center/hospital staff. 7. No alcoholic beverages 24 hours prior to surgery.  No smoking 24 hours prior to or 24 hours after surgery. 8. Wear loose pants or shorts- loose enough to fit over bandages, boots, and casts. 9. No slip on shoes, sneakers are best. 10. Bring  your boot with you to the surgery center/hospital.  Also bring crutches or a walker if your physician has prescribed it for you.  If you do not have this equipment, it will be provided for you after surgery. 11. If you have not been contracted by the surgery center/hospital by the day before your surgery, call to confirm the date and time of your surgery. 12. Leave-time from work may vary depending on the type of surgery you have.  Appropriate arrangements should be made prior to surgery with your employer. 13. Prescriptions will be provided immediately following surgery by your doctor.  Have these filled as soon as possible after surgery and take the medication as directed. 14. Remove nail polish on the operative foot. 15. Wash the night before surgery.  The night before surgery wash the foot and leg well with the antibacterial soap provided and water paying special attention to beneath the toenails and in between the toes.  Rinse thoroughly with water and dry well with a towel.  Perform this wash unless told not to do so by your physician.  Enclosed: 1 Ice pack (please put in freezer the night before surgery)   1 Hibiclens skin cleaner   Pre-op Instructions  If you have any questions regarding the instructions, do not hesitate to call our office.  [place]: [address] [place], [place] [address] [phone]  [place]: [address] [place], [place] [address] [phone]  [place]: [address]  [place], [place] [address] [phone]   Dr.   Norman Regal DPM, Dr. Matthew Wagoner DPM, Dr. M. Todd Hyatt DPM, Dr. Titorya Stover DPM 

## 2016-10-14 NOTE — Progress Notes (Signed)
She presents today for surgical consult regarding her left foot. All conservative therapies have failed including steroids nonsteroidals injections and formal physical therapy. She states that her left heel is dominating her life and is not allowing her to do the things that she wants to do.  Objective: Vital signs are stable alert and oriented 3. Pulses are palpable. The tendon reflexes are intact. Muscle strength is normal bilateral. Orthopedic evaluation inserts all joints distal to the ankle for range of motion without crepitation. She has severe pain on palpation medially continue tubercle of the left heel.  Assessment: Plantar fasciitis chronic in nature left heel.  Plan: We consented her today for an endoscopic plantar fasciotomy of the left foot. Dispensed a cam walker. We discussed the surgery in great detail today making no promises with outcome of the surgery. We did discuss the possible "altercations which may include but are not limited to postop pain bleeding swelling infection need further surgery overcorrection under correction rupture of the remaining fascia. She also developed DVT pulmonary embolism and complications from that.  We discussed /surgical center and the anesthesia group. I will follow-up with her in the near future.

## 2016-10-15 DIAGNOSIS — N201 Calculus of ureter: Secondary | ICD-10-CM | POA: Diagnosis not present

## 2016-10-18 DIAGNOSIS — R700 Elevated erythrocyte sedimentation rate: Secondary | ICD-10-CM | POA: Diagnosis not present

## 2016-10-18 DIAGNOSIS — G43909 Migraine, unspecified, not intractable, without status migrainosus: Secondary | ICD-10-CM | POA: Diagnosis not present

## 2016-10-18 DIAGNOSIS — R760 Raised antibody titer: Secondary | ICD-10-CM | POA: Diagnosis not present

## 2016-10-18 DIAGNOSIS — M722 Plantar fascial fibromatosis: Secondary | ICD-10-CM | POA: Diagnosis not present

## 2016-11-11 ENCOUNTER — Other Ambulatory Visit: Payer: Self-pay | Admitting: Podiatry

## 2016-11-11 MED ORDER — PROMETHAZINE HCL 25 MG PO TABS: 25.0000 mg | ORAL_TABLET | Freq: Three times a day (TID) | ORAL | 0 refills | Status: DC | PRN

## 2016-11-11 MED ORDER — CEPHALEXIN 500 MG PO CAPS: 500.0000 mg | ORAL_CAPSULE | Freq: Three times a day (TID) | ORAL | 0 refills | Status: DC

## 2016-11-11 MED ORDER — OXYCODONE-ACETAMINOPHEN 10-325 MG PO TABS: 1.0000 | ORAL_TABLET | Freq: Four times a day (QID) | ORAL | 0 refills | Status: DC | PRN

## 2016-11-12 ENCOUNTER — Encounter: Payer: Self-pay | Admitting: Podiatry

## 2016-11-12 DIAGNOSIS — M25572 Pain in left ankle and joints of left foot: Secondary | ICD-10-CM | POA: Diagnosis not present

## 2016-11-12 DIAGNOSIS — M722 Plantar fascial fibromatosis: Secondary | ICD-10-CM | POA: Diagnosis not present

## 2016-11-12 DIAGNOSIS — K219 Gastro-esophageal reflux disease without esophagitis: Secondary | ICD-10-CM | POA: Diagnosis not present

## 2016-11-12 DIAGNOSIS — M720 Palmar fascial fibromatosis [Dupuytren]: Secondary | ICD-10-CM | POA: Diagnosis not present

## 2016-11-18 ENCOUNTER — Ambulatory Visit (INDEPENDENT_AMBULATORY_CARE_PROVIDER_SITE_OTHER): Payer: Federal, State, Local not specified - PPO | Admitting: Podiatry

## 2016-11-18 VITALS — Temp 98.0°F

## 2016-11-18 DIAGNOSIS — M722 Plantar fascial fibromatosis: Secondary | ICD-10-CM

## 2016-11-18 NOTE — Progress Notes (Signed)
She presents today for for her first postop visit date of surgery 11/12/2016 follow-up EPF left foot. She states that had absolutely 0 pain and I do not have to take any medications.  Objective: Vital signs are stable alert and oriented 3. Pulses are palpable. Neurologic sensorium is intact dressing was intact once removed demonstrates no lesions or wounds. Sutures are intact margins well coapted.  Assessment: Well-healing surgical foot. No pain.  Plan: Placed her in Band-Aids covering the wounds are well allowed her to start getting this wet as long as she is showering only she will continue to use the Cam Walker at all times.

## 2016-11-25 ENCOUNTER — Ambulatory Visit: Payer: Federal, State, Local not specified - PPO | Admitting: Podiatry

## 2016-11-25 ENCOUNTER — Ambulatory Visit (INDEPENDENT_AMBULATORY_CARE_PROVIDER_SITE_OTHER): Payer: Federal, State, Local not specified - PPO | Admitting: Podiatry

## 2016-11-25 DIAGNOSIS — M722 Plantar fascial fibromatosis: Secondary | ICD-10-CM

## 2016-11-29 NOTE — Progress Notes (Signed)
She presents today for follow-up of her plantar fasciitis. Date of surgery was 11/12/2016. Endoscopic plantar fasciotomy formed on that day patient states that she is doing very well. She presents today in her Cam Walker.  Objective: Vital signs are stable she is alert and oriented 3 pulses are palpable. Sutures are intact once removed demonstrated well coapted margins. No signs of infection. No erythema edema cellulitis drainage or odor.  Assessment: Well-healing left endoscopic plantar fasciotomy.  Plan: We'll allow her to get back into her regular tissue however he will her to continue to use the night splint for at least another month.

## 2016-12-01 ENCOUNTER — Telehealth: Payer: Self-pay | Admitting: Podiatry

## 2016-12-01 DIAGNOSIS — Z9889 Other specified postprocedural states: Secondary | ICD-10-CM

## 2016-12-01 DIAGNOSIS — M722 Plantar fascial fibromatosis: Secondary | ICD-10-CM

## 2016-12-01 NOTE — Telephone Encounter (Signed)
Pt wants to know if she can start doing some physical therapy and also if she can wear the boot at night. The bandages has put a sore on her foot.

## 2016-12-02 NOTE — Telephone Encounter (Signed)
S/P EPF.  Last note states only wear boot at night for a month.  That should only be the night splint.  If she wants to go to therapy the that will be fine for Eval and tx.

## 2016-12-02 NOTE — Telephone Encounter (Addendum)
Pt would like to start PT and the boot has made a sore on her foot and she would like to know if she can just wear the boot at night. 12/03/2016-Left message with Dr. Stephenie Acres orders and [facility] [phone]. Pt presents to office states Dr. Stephenie Acres dressing rubbed an area on her foot where the night splint presses and is uncomfortable to sleep. Pt asked if she could sleep in the walking boot. I told her that would be fine, it kept the foot at a 90 degree angle also. Pt states she got my message in wanted to be referred to [facility] instead of [facility]. I told her I would make the change.

## 2016-12-03 NOTE — Addendum Note (Signed)
Addended by: Harriett Sine D on: 12/03/2016 03:10 PM   Modules accepted: Orders

## 2016-12-06 NOTE — Addendum Note (Signed)
Addended by: Harriett Sine D on: 12/06/2016 09:22 AM   Modules accepted: Orders

## 2016-12-09 DIAGNOSIS — Z803 Family history of malignant neoplasm of breast: Secondary | ICD-10-CM | POA: Diagnosis not present

## 2016-12-09 DIAGNOSIS — Z1231 Encounter for screening mammogram for malignant neoplasm of breast: Secondary | ICD-10-CM | POA: Diagnosis not present

## 2016-12-13 DIAGNOSIS — M79672 Pain in left foot: Secondary | ICD-10-CM | POA: Diagnosis not present

## 2016-12-16 ENCOUNTER — Ambulatory Visit (INDEPENDENT_AMBULATORY_CARE_PROVIDER_SITE_OTHER): Payer: Self-pay | Admitting: Podiatry

## 2016-12-16 DIAGNOSIS — Z9889 Other specified postprocedural states: Secondary | ICD-10-CM

## 2016-12-16 DIAGNOSIS — M722 Plantar fascial fibromatosis: Secondary | ICD-10-CM

## 2016-12-16 NOTE — Progress Notes (Signed)
She presents today for a follow-up of her endoscopic plantar fasciotomy date of surgery 11/12/2016. She states that is doing very well him back into regular shoes and no pain. Continues to sleep in her night splint.  Objective: Vital signs are stable she is alert and oriented 3 no pain on palpation medial continue tubercle.  Assessment: Resolving plantar fasciitis status post EPF left.  Plan: Monitor to get back to her regular routine continues sleep the night splint for at least 1 more week.

## 2016-12-21 DIAGNOSIS — M79672 Pain in left foot: Secondary | ICD-10-CM | POA: Diagnosis not present

## 2016-12-23 DIAGNOSIS — M79672 Pain in left foot: Secondary | ICD-10-CM | POA: Diagnosis not present

## 2016-12-28 DIAGNOSIS — M79672 Pain in left foot: Secondary | ICD-10-CM | POA: Diagnosis not present

## 2017-01-13 ENCOUNTER — Ambulatory Visit: Payer: Federal, State, Local not specified - PPO | Admitting: Podiatry

## 2017-01-20 ENCOUNTER — Encounter: Payer: Self-pay | Admitting: Podiatry

## 2017-01-20 ENCOUNTER — Ambulatory Visit (INDEPENDENT_AMBULATORY_CARE_PROVIDER_SITE_OTHER): Payer: Federal, State, Local not specified - PPO | Admitting: Podiatry

## 2017-01-20 DIAGNOSIS — M722 Plantar fascial fibromatosis: Secondary | ICD-10-CM

## 2017-01-20 MED ORDER — MELOXICAM 15 MG PO TABS: 15.0000 mg | ORAL_TABLET | Freq: Every day | ORAL | 3 refills | Status: DC

## 2017-01-20 NOTE — Progress Notes (Signed)
Really no better than he did prior to surgery at this point. She started to feel pain to the lateral aspect of the foot.  Objective: Vital signs are stable she's alert and oriented 3 she has pain with palpation calcaneocuboid joint today the fasciotomy appears to have decreased the pain in that area however she islateral pain.  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  Plan: She was casted today for repair orthotics.

## 2017-02-10 ENCOUNTER — Other Ambulatory Visit: Payer: Federal, State, Local not specified - PPO

## 2017-02-24 NOTE — Progress Notes (Signed)
1. Endoscopic plantar fasciotomy left

## 2017-03-10 ENCOUNTER — Encounter: Payer: Self-pay | Admitting: Podiatry

## 2017-03-10 ENCOUNTER — Ambulatory Visit (INDEPENDENT_AMBULATORY_CARE_PROVIDER_SITE_OTHER): Payer: Federal, State, Local not specified - PPO | Admitting: Podiatry

## 2017-03-10 DIAGNOSIS — M722 Plantar fascial fibromatosis: Secondary | ICD-10-CM | POA: Diagnosis not present

## 2017-03-10 DIAGNOSIS — Z9889 Other specified postprocedural states: Secondary | ICD-10-CM

## 2017-03-10 NOTE — Progress Notes (Signed)
She presents today for follow-up of her endoscopic plantar fasciotomy left foot. She states that the new orthotics and may such a difference she has no problems with them whatsoever. She states that she almost canceled this appointment today.  Objective: Vital signs are stable she is alert and oriented 3. Pulses are palpable area she has no reproducible pain on palpation of the foot. Still has some numbness or allogenic type pain from the initial surgical procedure where the dressing was tied across the medial dorsal cutaneous nerve or some type of injury had occurred with the boot or something like that.  Assessment: Well-healing surgical foot well-healing plantar fasciitis follow-up with me on an as-needed basis when necessary

## 2017-05-02 DIAGNOSIS — K219 Gastro-esophageal reflux disease without esophagitis: Secondary | ICD-10-CM | POA: Diagnosis not present

## 2017-05-06 ENCOUNTER — Encounter: Payer: Self-pay | Admitting: Family Medicine

## 2017-05-06 ENCOUNTER — Ambulatory Visit (INDEPENDENT_AMBULATORY_CARE_PROVIDER_SITE_OTHER): Payer: Federal, State, Local not specified - PPO | Admitting: Family Medicine

## 2017-05-06 ENCOUNTER — Telehealth: Payer: Self-pay | Admitting: Family Medicine

## 2017-05-06 ENCOUNTER — Other Ambulatory Visit (INDEPENDENT_AMBULATORY_CARE_PROVIDER_SITE_OTHER): Payer: Federal, State, Local not specified - PPO

## 2017-05-06 ENCOUNTER — Ambulatory Visit: Payer: Self-pay

## 2017-05-06 VITALS — BP 130/90 | HR 62 | Ht 62.5 in | Wt 186.0 lb

## 2017-05-06 DIAGNOSIS — M84375A Stress fracture, left foot, initial encounter for fracture: Secondary | ICD-10-CM | POA: Diagnosis not present

## 2017-05-06 DIAGNOSIS — M79672 Pain in left foot: Secondary | ICD-10-CM | POA: Diagnosis not present

## 2017-05-06 DIAGNOSIS — M2550 Pain in unspecified joint: Secondary | ICD-10-CM | POA: Diagnosis not present

## 2017-05-06 LAB — COMPREHENSIVE METABOLIC PANEL
ALT: 34 U/L (ref 0–35)
AST: 28 U/L (ref 0–37)
Albumin: 4.2 g/dL (ref 3.5–5.2)
Alkaline Phosphatase: 94 U/L (ref 39–117)
BUN: 11 mg/dL (ref 6–23)
CHLORIDE: 107 meq/L (ref 96–112)
CO2: 26 meq/L (ref 19–32)
Calcium: 9.6 mg/dL (ref 8.4–10.5)
Creatinine, Ser: 0.76 mg/dL (ref 0.40–1.20)
GFR: 84.79 mL/min (ref >60.00)
GLUCOSE: 84 mg/dL (ref 70–99)
POTASSIUM: 3.9 meq/L (ref 3.5–5.1)
Sodium: 140 mEq/L (ref 135–145)
Total Bilirubin: 0.3 mg/dL (ref 0.2–1.2)
Total Protein: 7.1 g/dL (ref 6.0–8.3)

## 2017-05-06 LAB — CBC WITH DIFFERENTIAL/PLATELET
BASOS ABS: 0 10*3/uL (ref 0.0–0.1)
Basophils Relative: 0.5 % (ref 0.0–3.0)
Eosinophils Absolute: 0.1 10*3/uL (ref 0.0–0.7)
Eosinophils Relative: 1.6 % (ref 0.0–5.0)
HCT: 39.4 % (ref 36.0–46.0)
HEMOGLOBIN: 12.6 g/dL (ref 12.0–15.0)
LYMPHS ABS: 2.7 10*3/uL (ref 0.7–4.0)
LYMPHS PCT: 45.2 % (ref 12.0–46.0)
MCHC: 32.1 g/dL (ref 30.0–36.0)
MCV: 85.3 fl (ref 78.0–100.0)
MONOS PCT: 8.1 % (ref 3.0–12.0)
Monocytes Absolute: 0.5 10*3/uL (ref 0.1–1.0)
NEUTROS PCT: 44.6 % (ref 43.0–77.0)
Neutro Abs: 2.6 10*3/uL (ref 1.4–7.7)
Platelets: 255 10*3/uL (ref 150.0–400.0)
RBC: 4.62 Mil/uL (ref 3.87–5.11)
RDW: 16.6 % — ABNORMAL HIGH (ref 11.5–15.5)
WBC: 5.9 10*3/uL (ref 4.0–10.5)

## 2017-05-06 LAB — FOLATE: Folate: 17.8 ng/mL (ref >5.9)

## 2017-05-06 LAB — IBC PANEL
Iron: 84 ug/dL (ref 42–145)
Saturation Ratios: 14.7 % — ABNORMAL LOW (ref 20.0–50.0)
TRANSFERRIN: 409 mg/dL — AB (ref 212.0–360.0)

## 2017-05-06 LAB — SEDIMENTATION RATE: SED RATE: 39 mm/h — AB (ref 0–30)

## 2017-05-06 LAB — VITAMIN B12: Vitamin B-12: 673 pg/mL (ref 211–911)

## 2017-05-06 LAB — TSH: TSH: 7.88 u[IU]/mL — ABNORMAL HIGH (ref 0.35–4.50)

## 2017-05-06 LAB — VITAMIN D 25 HYDROXY (VIT D DEFICIENCY, FRACTURES): VITD: 19.29 ng/mL — ABNORMAL LOW (ref 30.00–100.00)

## 2017-05-06 LAB — C-REACTIVE PROTEIN: CRP: 0.2 mg/dL — AB (ref 0.5–20.0)

## 2017-05-06 MED ORDER — VITAMIN D (ERGOCALCIFEROL) 1.25 MG (50000 UNIT) PO CAPS: 50000.0000 [IU] | ORAL_CAPSULE | ORAL | 0 refills | Status: DC

## 2017-05-06 NOTE — Progress Notes (Signed)
[facility] [place], [place] [address] Phone: [phone] Subjective:     CC: Foot pain  RSW:[id number]  Sabrina Mcdaniel is a 51 y.o. female coming in with complaint of left foot. She is having pain on the bottom of the foot as well as heel and achilles pain. She had surgery on the 25 of February 2018. She does note that her orthotics that she had made at [facility] that made her feet worse prior to the surgery. She had some made at the running store which has alleviated her pain. She is unable to walk short distances without having an increase in her pain.   Onset- February Location- left foot Duration- constant Character-dull, achy Aggravating factors- walking even short distances Reliving factors- ice socks Therapies tried- patient has had multiple different therapies including surgery for the plantar fashion with no significant benefit. Severity-1-2/10 vision states that she is unable to walk regularly secondary to pain. Patient has had dental the with being without pain at any time. Sometimes intermittent numbness noted. Denies any weakness. Patient is unable to be active secondary to the pain.     Past Medical History:  Diagnosis Date  . Anemia    Iron deficiency  . B12 deficiency   . Cholelithiasis   . Depression   . GERD (gastroesophageal reflux disease)   . Migraines   . Osteopenia    Past Surgical History:  Procedure Laterality Date  . ABDOMINAL HYSTERECTOMY  2009   anterior and posterior repair  . COLONOSCOPY  2011  . ESOPHAGOGASTRODUODENOSCOPY ENDOSCOPY    . HERNIA REPAIR  2014   hiatal  . LASIK  2008  . TONSILLECTOMY    . TUBAL LIGATION  1999   Social History   Social History  . Marital status: Married    Spouse name: N/A  . Number of children: N/A  . Years of education: N/A   Social History Main Topics  . Smoking status: Former Smoker    Quit date: 02/14/1998  . Smokeless tobacco: Never Used  .  Alcohol use No  . Drug use: No  . Sexual activity: Not Asked   Other Topics Concern  . None   Social History Narrative  . None   No Known Allergies Family History  Problem Relation Age of Onset  . Cancer Mother 44       breast      Past medical history, social, surgical and family history all reviewed in electronic medical record.  No pertanent information unless stated regarding to the chief complaint.   Review of Systems:Review of systems updated and as accurate as of 05/06/17  No headache, visual changes, nausea, vomiting, diarrhea, constipation, dizziness, abdominal pain, skin rash, fevers, chills, night sweats, weight loss, swollen lymph nodes, body aches, joint swelling,  chest pain, shortness of breath, mood changes. Positive muscle aches  Objective  Blood pressure 130/90, pulse 62, height 5' 2.5" (1.588 m), weight 186 lb (84.4 kg). Systems examined below as of 05/06/17   General: No apparent distress alert and oriented x3 mood and affect normal, dressed appropriately.  HEENT: Pupils equal, extraocular movements intact  Respiratory: Patient's speak in full sentences and does not appear short of breath  Cardiovascular: No lower extremity edema, non tender, no erythema  Skin: Warm dry intact with no signs of infection or rash on extremities or on axial skeleton. Pale complexiton with possible grayish undertone Abdomen: Soft nontender  Neuro: Cranial nerves II through  XII are intact, neurovascularly intact in all extremities with 2+ DTRs and 2+ pulses.  Lymph: No lymphadenopathy of posterior or anterior cervical chain or axillae bilaterally.  Gait normal with good balance and coordination.  MSK:  Non tender with full range of motion and good stability and symmetric strength and tone of shoulders, elbows, wrist, hip, knee and ankles bilaterally.  Foot exam shows the patient has some mild loss of the longitudinal are. Patient also has some much of a rigid midfoot. Mild diffuse  tenderness noted.  MSK US performed of: Left foot This study was ordered, performed, and interpreted by Charlann Boxer D.O.  Foot/Ankle:   Plantar fascia normal thickness and 1.17 cm. No erythema. Scar tissue from previous surgery noted. Diffusely patient does have what appears to be thinning of the cortex of the calcaneus. No true cortical defect the patient has some nonspecific soft tissue swelling.  IMPRESSION:  Abnormality of the calcaneal bone possible osteopenia     Impression and Recommendations:     This case required medical decision making of moderate complexity.      Note: This dictation was prepared with Dragon dictation along with smaller phrase technology. Any transcriptional errors that result from this process are unintentional.

## 2017-05-06 NOTE — Telephone Encounter (Signed)
Patient states that she thought she was going to be getting some kind of samples of a cream, she does not remember what it was. I am going to guess many she is talking about Pennsaid? Please advise. Thank you.   Patient was informed Dr.Smith had left for the day, we would follow up with her on Monday.

## 2017-05-06 NOTE — Patient Instructions (Signed)
Good to see you  Stay active though  Avoid being barefoot.  Once weekly vitamin D  Get labs downstairs Over the counter get B6 200mg  daily  Tart cherry extract any dose at night Turmeric 500mg  1-2 times a day for inflammation  See me again in 3-4 weeks Sorry for being late

## 2017-05-06 NOTE — Assessment & Plan Note (Signed)
I believe the patient does have more of a stress fracture of the left foot. I do think that this is giving patient's some discomfort. Patient will be put on more of a vitamin D. Patient does appear to have a nonhealing fracture and is had this pain for multiple years. Do feel that further laboratory workup for patient's pain as well as her polyarthralgia would be a good idea. Depending on findings this could change medical management. Patient has had difficulty with iron as well as B12 previously in the past. Encourage her to continue the other locations. We have seen some related increase in stress fractures with chronic PPI as well patient will consider changing her medication at this time. We discussed continuing to avoid being barefoot. Patient will avoid doing the exercises right now with no signs of the plantar fascia. Follow-up again in 4-6 weeks.

## 2017-05-07 LAB — CALCIUM, IONIZED: Calcium, Ion: 5.4 mg/dL (ref 4.8–5.6)

## 2017-05-09 ENCOUNTER — Other Ambulatory Visit: Payer: Self-pay | Admitting: Family Medicine

## 2017-05-09 LAB — PTH, INTACT AND CALCIUM
Calcium: 9.7 mg/dL (ref 8.6–10.4)
PTH: 59 pg/mL (ref 14–64)

## 2017-05-09 LAB — ANGIOTENSIN CONVERTING ENZYME: Angiotensin-Converting Enzyme: 53 U/L (ref 9–67)

## 2017-05-09 LAB — RHEUMATOID FACTOR: Rhuematoid fact SerPl-aCnc: <14 IU/mL (ref <14)

## 2017-05-09 MED ORDER — LEVOTHYROXINE SODIUM 88 MCG PO TABS
88.0000 ug | ORAL_TABLET | Freq: Every day | ORAL | 3 refills | Status: DC
Start: 2017-05-09 — End: 2017-09-05

## 2017-05-09 NOTE — Telephone Encounter (Signed)
I skyped Ria Comment while Pt was on phone and asked for samples, Ria Comment said they will be up front for her today to be picked up Pt notified

## 2017-05-09 NOTE — Progress Notes (Signed)
Laboratory workup showed the patient does have hypothyroidism started Synthroid.

## 2017-05-09 NOTE — Telephone Encounter (Signed)
pennsaid samples left at front desk for pt to pickup.

## 2017-05-10 LAB — ANA: ANA: POSITIVE — AB

## 2017-05-10 LAB — ANTI-NUCLEAR AB-TITER (ANA TITER): ANA Titer 1: 1:160 {titer} — ABNORMAL HIGH

## 2017-05-27 ENCOUNTER — Encounter: Payer: Self-pay | Admitting: Family Medicine

## 2017-05-27 ENCOUNTER — Ambulatory Visit (INDEPENDENT_AMBULATORY_CARE_PROVIDER_SITE_OTHER): Payer: Federal, State, Local not specified - PPO | Admitting: Family Medicine

## 2017-05-27 ENCOUNTER — Ambulatory Visit: Payer: Self-pay

## 2017-05-27 VITALS — BP 118/78 | HR 82 | Ht 62.0 in | Wt 188.0 lb

## 2017-05-27 DIAGNOSIS — M84375D Stress fracture, left foot, subsequent encounter for fracture with routine healing: Secondary | ICD-10-CM

## 2017-05-27 DIAGNOSIS — M79672 Pain in left foot: Secondary | ICD-10-CM

## 2017-05-27 DIAGNOSIS — E039 Hypothyroidism, unspecified: Secondary | ICD-10-CM | POA: Insufficient documentation

## 2017-05-27 NOTE — Assessment & Plan Note (Signed)
Patient does have hypothyroidism. Did have a positive ANA but a negative titer. Monitor for any type of Hashimoto's disease. Discussed with patient about the Synthroid to continue the same dose at this time. We will recheck in 6-8 weeks for further evaluation.

## 2017-05-27 NOTE — Patient Instructions (Signed)
Good to see you  You are doing great  Iron continue for another 2 weeks then 3 times a week  Continue the vitamin D We will recheck thyroid in 6 weeks.  See me again in 6 weeks Alphonsa Overall would be great

## 2017-05-27 NOTE — Progress Notes (Signed)
[facility] [place] [place], [place] [address] Phone: [phone] Subjective:    I'm seeing this patient by the request  of:    CC: Fatigued and left foot follow-up  GEX:[id number]  Sabrina Mcdaniel is a 51 y.o. female coming in for follow up for left foot pain. She is having good and bad days but over feels that she is improving. Patient was found to have more of a stress fracture of the calcaneal region. Patient states that it is feeling better with the once weekly vitamin D. Patient was also having significant fatigue and was found to have hypothyroidism. Started on medications that she's been a significant improvement. Patient states that she has not felt this good in years. Still having some dull, throbbing aching pain of the heel but not as severe as what it was previously.      Past Medical History:  Diagnosis Date  . Anemia    Iron deficiency  . B12 deficiency   . Cholelithiasis   . Depression   . GERD (gastroesophageal reflux disease)   . Migraines   . Osteopenia    Past Surgical History:  Procedure Laterality Date  . ABDOMINAL HYSTERECTOMY  2009   anterior and posterior repair  . COLONOSCOPY  2011  . ESOPHAGOGASTRODUODENOSCOPY ENDOSCOPY    . HERNIA REPAIR  2014   hiatal  . LASIK  2008  . TONSILLECTOMY    . TUBAL LIGATION  1999   Social History   Social History  . Marital status: Married    Spouse name: N/A  . Number of children: N/A  . Years of education: N/A   Social History Main Topics  . Smoking status: Former Smoker    Quit date: 02/14/1998  . Smokeless tobacco: Never Used  . Alcohol use No  . Drug use: No  . Sexual activity: Not Asked   Other Topics Concern  . None   Social History Narrative  . None   No Known Allergies Family History  Problem Relation Age of Onset  . Cancer Mother 38       breast      Past medical history, social, surgical and family history all reviewed in electronic medical record.   No pertanent information unless stated regarding to the chief complaint.   Review of Systems:Review of systems updated and as accurate as of 05/27/17  No headache, visual changes, nausea, vomiting, diarrhea, constipation, dizziness, abdominal pain, skin rash, fevers, chills, night sweats, weight loss, swollen lymph nodes, body aches, joint swelling, chest pain, shortness of breath, mood changes. Positive muscle aches  Objective  Blood pressure 118/78, pulse 82, height 5\' 2"  (1.575 m), weight 188 lb (85.3 kg), SpO2 98 %. Systems examined below as of 05/27/17   General: No apparent distress alert and oriented x3 mood and affect normal, dressed appropriately.  HEENT: Pupils equal, extraocular movements intact  Respiratory: Patient's speak in full sentences and does not appear short of breath  Cardiovascular: No lower extremity edema, non tender, no erythema  Skin: Warm dry intact with no signs of infection or rash on extremities or on axial skeleton.  Abdomen: Soft nontender  Neuro: Cranial nerves II through XII are intact, neurovascularly intact in all extremities with 2+ DTRs and 2+ pulses.  Lymph: No lymphadenopathy of posterior or anterior cervical chain or axillae bilaterally.  Gait normal with good balance and coordination.  MSK:  Non tender with full range of motion and good stability and symmetric  strength and tone of shoulders, elbows, wrist, hip, knee and ankles bilaterally.   Left foot exam shows still some mild discomfort on the plantar aspect of the patella. Less tenderness than previously.  Limited muscular skeletal ultrasound was performed and interpreted by Lyndal Pulley  Limited ultrasound to make calcaneal region shows the patient does have callus formation noted. Hypoechoic changes and mild increasing neovascularization noted. Impression: Calcaneal fracture healing   Impression and Recommendations:     This case required medical decision making of moderate  complexity.      Note: This dictation was prepared with Dragon dictation along with smaller phrase technology. Any transcriptional errors that result from this process are unintentional.

## 2017-05-27 NOTE — Assessment & Plan Note (Signed)
Healing fracture noted. Discussed with patient continuing the icing regimen, discussed which activities doing which ones to avoid. Continue the other medications at this time including the once weekly vitamin D. Patient will continue to increase activity and follow-up again in 6 weeks

## 2017-07-08 ENCOUNTER — Encounter: Payer: Self-pay | Admitting: Family Medicine

## 2017-07-08 ENCOUNTER — Ambulatory Visit (INDEPENDENT_AMBULATORY_CARE_PROVIDER_SITE_OTHER): Payer: Federal, State, Local not specified - PPO | Admitting: Family Medicine

## 2017-07-08 ENCOUNTER — Other Ambulatory Visit (INDEPENDENT_AMBULATORY_CARE_PROVIDER_SITE_OTHER): Payer: Federal, State, Local not specified - PPO

## 2017-07-08 VITALS — BP 122/76 | HR 71 | Ht 63.0 in | Wt 188.0 lb

## 2017-07-08 DIAGNOSIS — E611 Iron deficiency: Secondary | ICD-10-CM | POA: Diagnosis not present

## 2017-07-08 DIAGNOSIS — E039 Hypothyroidism, unspecified: Secondary | ICD-10-CM | POA: Diagnosis not present

## 2017-07-08 DIAGNOSIS — M84375D Stress fracture, left foot, subsequent encounter for fracture with routine healing: Secondary | ICD-10-CM | POA: Diagnosis not present

## 2017-07-08 LAB — T3, FREE: T3, Free: 3.4 pg/mL (ref 2.3–4.2)

## 2017-07-08 LAB — URIC ACID: Uric Acid, Serum: 5.2 mg/dL (ref 2.4–7.0)

## 2017-07-08 LAB — IBC PANEL
Iron: 63 ug/dL (ref 42–145)
Saturation Ratios: 14.9 % — ABNORMAL LOW (ref 20.0–50.0)
TRANSFERRIN: 302 mg/dL (ref 212.0–360.0)

## 2017-07-08 LAB — T4, FREE: Free T4: 1.29 ng/dL (ref 0.60–1.60)

## 2017-07-08 LAB — SEDIMENTATION RATE: Sed Rate: 23 mm/hr (ref 0–30)

## 2017-07-08 LAB — TSH: TSH: 0.21 u[IU]/mL — AB (ref 0.35–4.50)

## 2017-07-08 NOTE — Assessment & Plan Note (Signed)
Improved again.

## 2017-07-08 NOTE — Progress Notes (Signed)
[facility] [place], [place] [address] Phone: [phone] Subjective:    I'm seeing this patient by the request  of:    CC: Foot pain follow-up, hypothyroidism  QIH:[id number]  Sabrina Mcdaniel is a 51 y.o. female coming in with complaint of  Polyarthralgia-patient was found to have a positive ANA but also had hypothyroidism. Started on Synthroid and was making significant improvement. Patient was considering going to a rheumatologist all. Patient states At this point she would like to all lumbar rheumatologist. Once to see what her other laboratory workup Patient was doing significantly better with the thyroid medication initially but now seems to have plateaued again.  Patient also had a calcaneal stress fracture. Was healing at last exam. Patient was on once weekly vitamin D and was to continue. Patient was to change shoes. Patient states that her heel is feeling great but that at times her entire foot can get sore. She believes that it occurs after a lot of walking but the pain is not in the heel. She notes a tightness in her ankle. Otherwise she feels that she is at 85%.       Past Medical History:  Diagnosis Date  . Anemia    Iron deficiency  . B12 deficiency   . Cholelithiasis   . Depression   . GERD (gastroesophageal reflux disease)   . Migraines   . Osteopenia    Past Surgical History:  Procedure Laterality Date  . ABDOMINAL HYSTERECTOMY  2009   anterior and posterior repair  . COLONOSCOPY  2011  . ESOPHAGOGASTRODUODENOSCOPY ENDOSCOPY    . HERNIA REPAIR  2014   hiatal  . LASIK  2008  . TONSILLECTOMY    . TUBAL LIGATION  1999   Social History   Social History  . Marital status: Married    Spouse name: N/A  . Number of children: N/A  . Years of education: N/A   Social History Main Topics  . Smoking status: Former Smoker    Quit date: 02/14/1998  . Smokeless tobacco: Never Used  . Alcohol use No  . Drug use: No  .  Sexual activity: Not Asked   Other Topics Concern  . None   Social History Narrative  . None   No Known Allergies Family History  Problem Relation Age of Onset  . Cancer Mother 60       breast      Past medical history, social, surgical and family history all reviewed in electronic medical record.  No pertanent information unless stated regarding to the chief complaint.   Review of Systems:Review of systems updated and as accurate as of 07/08/17  No visual changes, nausea, vomiting, diarrhea, constipation, dizziness, abdominal pain, skin rash, fevers, chills, night sweats, weight loss, swollen lymph nodes, , joint swelling,  chest pain, shortness of breath, mood changes. Positive fatigue, muscle aches, body aches and headaches  Objective  Blood pressure 122/76, pulse 71, height 5\' 3"  (1.6 m), weight 188 lb (85.3 kg), SpO2 93 %. Systems examined below as of 07/08/17   General: No apparent distress alert and oriented x3 mood and affect normal, dressed appropriately.  HEENT: Pupils equal, extraocular movements intact  Respiratory: Patient's speak in full sentences and does not appear short of breath  Cardiovascular: No lower extremity edema, non tender, no erythema  Skin: Warm dry intact with no signs of infection or rash on extremities or on axial skeleton.  Abdomen: Soft nontender  Neuro:  Cranial nerves II through XII are intact, neurovascularly intact in all extremities with 2+ DTRs and 2+ pulses.  Lymph: No lymphadenopathy of posterior or anterior cervical chain or axillae bilaterally.  Gait normal with good balance and coordination.  MSK:  Mild tender with full range of motion and good stability and symmetric strength and tone of shoulders, elbows, wrist, hip, knee and ankles bilaterally.  Foot exam is unremarkable for any type of pain. Good range of motion of the ankles. Mild increasing discomfort generalized from previous exam.   Impression and Recommendations:     This  case required medical decision making of moderate complexity.      Note: This dictation was prepared with Dragon dictation along with smaller phrase technology. Any transcriptional errors that result from this process are unintentional.

## 2017-07-08 NOTE — Assessment & Plan Note (Signed)
Recheck labs today to make sure at the proper amount. Recheck some of the other labs that were abnormal such as iron deficiency.

## 2017-07-08 NOTE — Patient Instructions (Signed)
You are doing amazing Look at low oxalate level diet  Ice is your friend.  The foot should do well  Lets see what the labs show and I will contact you and make changes.  Getting you in the gym is the next step  See me again in 2 months but we will talk before then!

## 2017-07-11 ENCOUNTER — Encounter: Payer: Self-pay | Admitting: Family Medicine

## 2017-07-11 MED ORDER — VITAMIN D (ERGOCALCIFEROL) 1.25 MG (50000 UNIT) PO CAPS: 50000.0000 [IU] | ORAL_CAPSULE | ORAL | 0 refills | Status: DC

## 2017-07-13 LAB — VITAMIN D 1,25 DIHYDROXY
Vitamin D 1, 25 (OH)2 Total: 54 pg/mL (ref 18–72)
Vitamin D2 1, 25 (OH)2: 40 pg/mL
Vitamin D3 1, 25 (OH)2: 14 pg/mL

## 2017-07-23 LAB — GLUCOSE, POCT (MANUAL RESULT ENTRY): POC Glucose: 106 mg/dl — AB (ref 70–99)

## 2017-08-08 ENCOUNTER — Encounter: Payer: Self-pay | Admitting: Family Medicine

## 2017-08-08 ENCOUNTER — Ambulatory Visit: Payer: Federal, State, Local not specified - PPO | Admitting: Family Medicine

## 2017-08-08 ENCOUNTER — Other Ambulatory Visit: Payer: Self-pay | Admitting: Family Medicine

## 2017-08-08 VITALS — BP 122/68 | HR 63 | Temp 97.6°F | Ht 63.0 in | Wt 196.0 lb

## 2017-08-08 DIAGNOSIS — R229 Localized swelling, mass and lump, unspecified: Secondary | ICD-10-CM

## 2017-08-08 NOTE — Progress Notes (Signed)
Subjective:   Patient ID: Sabrina Mcdaniel, female    DOB: December 19, 1965, 51 y.o.   MRN: [id number]  Sabrina Mcdaniel is a pleasant 51 y.o. year old female who presents to clinic today with Mass (Located on her right mid-upper quadrant-has been present since August. It is tender at times. )  on 08/08/2017  HPI:  Patient is new to me.  Mass right upper quadrant-  Tender, subcutaneous mass, right upper quadrant of her abdomen. Has not grown in size.  Has other similar masses on her arms on legs but they have never been tender.    Current Outpatient Medications on File Prior to Visit  Medication Sig Dispense Refill  . Ascorbic Acid (VITAMIN C) 1000 MG tablet Take 1,000 mg by mouth daily.    . calcium carbonate 1250 MG capsule Take 600 mg by mouth every other day.    . Cyanocobalamin (B-12 COMPLIANCE INJECTION) 1000 MCG/ML KIT Inject as directed.    Marland Kitchen esomeprazole (NEXIUM) 20 MG capsule Take 20 mg daily at 12 noon by mouth.    . Iron-Vitamin C (IRON 100/C PO) Take 65 mg by mouth.    . levothyroxine (SYNTHROID, LEVOTHROID) 88 MCG tablet Take 1 tablet (88 mcg total) by mouth daily. 90 tablet 3  . Vitamin D, Ergocalciferol, (DRISDOL) 50000 units CAPS capsule Take 1 capsule (50,000 Units total) by mouth every 7 (seven) days. 12 capsule 0   No current facility-administered medications on file prior to visit.     No Known Allergies  Past Medical History:  Diagnosis Date  . Anemia    Iron deficiency  . B12 deficiency   . Cholelithiasis   . Depression   . GERD (gastroesophageal reflux disease)   . Migraines   . Osteopenia     Past Surgical History:  Procedure Laterality Date  . ABDOMINAL HYSTERECTOMY  2009   anterior and posterior repair  . COLONOSCOPY  2011  . ESOPHAGOGASTRODUODENOSCOPY ENDOSCOPY    . HERNIA REPAIR  2014   hiatal  . LASIK  2008  . TONSILLECTOMY    . TUBAL LIGATION  1999    Family History  Problem Relation Age of Onset  . Cancer Mother 61       breast      Social History   Socioeconomic History  . Marital status: Married    Spouse name: Not on file  . Number of children: Not on file  . Years of education: Not on file  . Highest education level: Not on file  Social Needs  . Financial resource strain: Not on file  . Food insecurity - worry: Not on file  . Food insecurity - inability: Not on file  . Transportation needs - medical: Not on file  . Transportation needs - non-medical: Not on file  Occupational History  . Not on file  Tobacco Use  . Smoking status: Former Smoker    Last attempt to quit: 02/14/1998    Years since quitting: 19.4  . Smokeless tobacco: Never Used  Substance and Sexual Activity  . Alcohol use: No  . Drug use: No  . Sexual activity: Not on file  Other Topics Concern  . Not on file  Social History Narrative  . Not on file   The PMH, PSH, Social History, Family History, Medications, and allergies have been reviewed in [facility], and have been updated if relevant.   Review of Systems  Endocrine: Negative.   Neurological: Negative.   All other systems reviewed  and are negative.      Objective:    BP 122/68 (BP Location: Left Arm, Patient Position: Sitting, Cuff Size: Normal)   Pulse 63   Temp 97.6 F (36.4 C) (Oral)   Ht _0  (1.6 m)   Wt 196 lb (88.9 kg)   SpO2 98%   BMI 34.72 kg/m    Physical Exam  Constitutional: She is oriented to person, place, and time. She appears well-developed and well-nourished. No distress.  HENT:  Head: Normocephalic and atraumatic.  Eyes: Conjunctivae are normal.  Pulmonary/Chest: Effort normal.  Lymphadenopathy:    She has no cervical adenopathy.    She has no axillary adenopathy.       Right: No inguinal adenopathy present.       Left: No inguinal adenopathy present.  Neurological: She is oriented to person, place, and time. No cranial nerve deficit.  Skin: Skin is warm and dry. She is not diaphoretic.     Psychiatric: She has a normal mood and affect.  Her behavior is normal. Judgment and thought content normal.  Nursing note and vitals reviewed.         Assessment & Plan:   Subcutaneous mass - Plan: [place] MiscellaneoUS Localization No Follow-up on file.

## 2017-08-08 NOTE — Addendum Note (Signed)
Addended by: Verlene Mayer A on: 08/08/2017 04:08 PM   Modules accepted: Orders

## 2017-08-08 NOTE — Patient Instructions (Signed)
Great to see you. Please stop by to see Linda on your way out. 

## 2017-08-08 NOTE — Assessment & Plan Note (Signed)
Probable lipoma. Will get [place] for further evaluation. The patient indicates understanding of these issues and agrees with the plan.  Orders Placed This Encounter  Procedures  . [place] MiscellaneoUS Localization

## 2017-08-16 ENCOUNTER — Ambulatory Visit
Admission: RE | Admit: 2017-08-16 | Discharge: 2017-08-16 | Disposition: A | Discharge status: Home / Self Care | Payer: Federal, State, Local not specified - PPO | Source: Ambulatory Visit | Attending: Family Medicine | Admitting: Family Medicine

## 2017-08-16 DIAGNOSIS — R221 Localized swelling, mass and lump, neck: Secondary | ICD-10-CM | POA: Diagnosis not present

## 2017-08-16 DIAGNOSIS — R229 Localized swelling, mass and lump, unspecified: Secondary | ICD-10-CM

## 2017-08-26 ENCOUNTER — Telehealth: Payer: Self-pay

## 2017-08-26 NOTE — Telephone Encounter (Signed)
I am not sure what to do about this either.  Is it possible that we recorded the wrong weight?  The patient likely knows her weight and if we recorded the wrong weight, we can change it.

## 2017-08-26 NOTE — Telephone Encounter (Signed)
TA-Patient was seen on 11.19.18 and is requesting that her weight be changed to 188 lbs and says that the weight in the chart is 196 lbs and she is not comfortable with that/I'm not sure about what to do with this? Plz advise/thx dmf   Copied from [place] [phone]. Topic: Inquiry >> Aug 22, 2017 11:05 AM Oliver Pila B wrote: Reason for CRM: pt called to give a new weight of 188 to be put on her record b/c her 11.19 visit she says that the weight was put in 196lbs but she wasn't comfortable with that

## 2017-08-26 NOTE — Telephone Encounter (Signed)
TA-It is possible/if the person does not zero out the scale prior to the patient standing on it then it will be inaccurate

## 2017-09-01 ENCOUNTER — Ambulatory Visit: Payer: Federal, State, Local not specified - PPO | Admitting: Family Medicine

## 2017-09-01 ENCOUNTER — Encounter: Payer: Self-pay | Admitting: Family Medicine

## 2017-09-01 VITALS — BP 112/74 | HR 60 | Temp 98.0°F | Ht 63.0 in | Wt 188.8 lb

## 2017-09-01 DIAGNOSIS — E039 Hypothyroidism, unspecified: Secondary | ICD-10-CM

## 2017-09-01 DIAGNOSIS — E559 Vitamin D deficiency, unspecified: Secondary | ICD-10-CM | POA: Insufficient documentation

## 2017-09-01 MED ORDER — CYANOCOBALAMIN 1000 MCG/ML IJ KIT: PACK | INTRAMUSCULAR | 3 refills | Status: DC

## 2017-09-01 NOTE — Patient Instructions (Signed)
Great to see you.  Happy Holidays.  We will call you with your lab results or you can view them online.

## 2017-09-01 NOTE — Assessment & Plan Note (Signed)
Check Vit D and Vit B12 today.

## 2017-09-01 NOTE — Progress Notes (Signed)
Subjective:   Patient ID: Sabrina Mcdaniel, female    DOB: [birth year], 51 y.o.   MRN: [id number]  Sabrina Mcdaniel is a pleasant 51 y.o. year old female who presents to clinic today with Establish Care (Patient is here today to establish care)  on 09/01/2017  HPI:  Hypothyroidism- Taking synthroid 88 mcg daily. Due for repeat labs- TSH was low two months ago.  Known Vit D and Vit B12 deficiencies. Feels fine but would like her levels checked as well.   Current Outpatient Medications on File Prior to Visit  Medication Sig Dispense Refill  . Ascorbic Acid (VITAMIN C) 1000 MG tablet Take 1,000 mg by mouth daily. 1qod    . calcium carbonate 1250 MG capsule Take 600 mg by mouth every other day.    . esomeprazole (NEXIUM) 20 MG capsule Take 20 mg daily at 12 noon by mouth.    . Iron-Vitamin C (IRON 100/C PO) Take 65 mg by mouth. qod    . levothyroxine (SYNTHROID, LEVOTHROID) 88 MCG tablet Take 1 tablet (88 mcg total) by mouth daily. 90 tablet 3  . Vitamin D, Ergocalciferol, (DRISDOL) 50000 units CAPS capsule Take 1 capsule (50,000 Units total) by mouth every 7 (seven) days. 12 capsule 0   No current facility-administered medications on file prior to visit.     No Known Allergies  Past Medical History:  Diagnosis Date  . Anemia    Iron deficiency  . B12 deficiency   . Cholelithiasis   . Depression   . GERD (gastroesophageal reflux disease)   . Migraines   . Osteopenia     Past Surgical History:  Procedure Laterality Date  . ABDOMINAL HYSTERECTOMY  2009   anterior and posterior repair  . COLONOSCOPY  2011  . ESOPHAGOGASTRODUODENOSCOPY ENDOSCOPY    . HERNIA REPAIR  2014   hiatal  . LASIK  2008  . TONSILLECTOMY    . TUBAL LIGATION  1999    Family History  Problem Relation Age of Onset  . Cancer Mother 89       breast     Social History   Socioeconomic History  . Marital status: Married    Spouse name: Not on file  . Number of children: Not on file  . Years of  education: Not on file  . Highest education level: Not on file  Social Needs  . Financial resource strain: Not on file  . Food insecurity - worry: Not on file  . Food insecurity - inability: Not on file  . Transportation needs - medical: Not on file  . Transportation needs - non-medical: Not on file  Occupational History  . Not on file  Tobacco Use  . Smoking status: Former Smoker    Last attempt to quit: 02/14/1998    Years since quitting: 19.5  . Smokeless tobacco: Never Used  Substance and Sexual Activity  . Alcohol use: No  . Drug use: No  . Sexual activity: Not on file  Other Topics Concern  . Not on file  Social History Narrative  . Not on file   The PMH, PSH, Social History, Family History, Medications, and allergies have been reviewed in [facility], and have been updated if relevant.   Review of Systems  Constitutional: Negative.   HENT: Negative.   Skin: Negative.   Neurological: Negative.   Hematological: Negative.   Psychiatric/Behavioral: Negative.   All other systems reviewed and are negative.      Objective:  BP 112/74 (BP Location: Left Arm, Patient Position: Sitting, Cuff Size: Normal)   Pulse 60   Temp 98 F (36.7 C) (Oral)   Ht 5\' 3"  (1.6 m)   Wt 188 lb 12.8 oz (85.6 kg)   SpO2 96%   BMI 33.44 kg/m   Wt Readings from Last 3 Encounters:  09/01/17 188 lb 12.8 oz (85.6 kg)  08/08/17 196 lb (88.9 kg)  07/08/17 188 lb (85.3 kg)    Physical Exam  Constitutional: She is oriented to person, place, and time. She appears well-developed and well-nourished. No distress.  HENT:  Head: Normocephalic and atraumatic.  Eyes: Conjunctivae are normal.  Neck: Neck supple. No thyromegaly present.  Cardiovascular: Normal rate.  Pulmonary/Chest: Effort normal and breath sounds normal.  Musculoskeletal: Normal range of motion. She exhibits no edema.  Neurological: She is alert and oriented to person, place, and time.  Skin: Skin is warm and dry. She is not  diaphoretic.  Psychiatric: She has a normal mood and affect. Her behavior is normal. Judgment and thought content normal.          Assessment & Plan:   Vitamin D deficiency - Plan: Vitamin B12, Vitamin D (25 hydroxy), CANCELED: Vitamin D (25 hydroxy), CANCELED: Vitamin B12  Hypothyroidism, unspecified type - Plan: TSH, T4, free, Vitamin B12, CANCELED: TSH, CANCELED: T4, free No Follow-up on file.

## 2017-09-01 NOTE — Assessment & Plan Note (Signed)
Continue current dose of synthroid.  Check labs today. 

## 2017-09-02 ENCOUNTER — Other Ambulatory Visit (INDEPENDENT_AMBULATORY_CARE_PROVIDER_SITE_OTHER): Payer: Federal, State, Local not specified - PPO

## 2017-09-02 DIAGNOSIS — E559 Vitamin D deficiency, unspecified: Secondary | ICD-10-CM

## 2017-09-02 DIAGNOSIS — E039 Hypothyroidism, unspecified: Secondary | ICD-10-CM

## 2017-09-02 LAB — VITAMIN D 25 HYDROXY (VIT D DEFICIENCY, FRACTURES): VITD: 38.24 ng/mL (ref 30.00–100.00)

## 2017-09-02 LAB — VITAMIN B12: Vitamin B-12: 566 pg/mL (ref 211–911)

## 2017-09-02 LAB — TSH: TSH: 0.24 u[IU]/mL — AB (ref 0.35–4.50)

## 2017-09-02 LAB — T4, FREE: FREE T4: 1.05 ng/dL (ref 0.60–1.60)

## 2017-09-05 ENCOUNTER — Other Ambulatory Visit: Payer: Self-pay | Admitting: Nurse Practitioner

## 2017-09-05 DIAGNOSIS — E039 Hypothyroidism, unspecified: Secondary | ICD-10-CM

## 2017-09-05 DIAGNOSIS — E559 Vitamin D deficiency, unspecified: Secondary | ICD-10-CM

## 2017-09-05 MED ORDER — LEVOTHYROXINE SODIUM 75 MCG PO TABS: 75.0000 ug | ORAL_TABLET | Freq: Every day | ORAL | 0 refills | Status: DC

## 2017-09-05 MED ORDER — VITAMIN D (ERGOCALCIFEROL) 1.25 MG (50000 UNIT) PO CAPS: 50000.0000 [IU] | ORAL_CAPSULE | ORAL | 0 refills | Status: DC

## 2017-09-07 ENCOUNTER — Ambulatory Visit: Payer: Federal, State, Local not specified - PPO | Admitting: Family Medicine

## 2017-10-14 ENCOUNTER — Other Ambulatory Visit: Payer: Self-pay

## 2017-10-17 ENCOUNTER — Telehealth: Payer: Self-pay

## 2017-10-17 ENCOUNTER — Other Ambulatory Visit (INDEPENDENT_AMBULATORY_CARE_PROVIDER_SITE_OTHER): Payer: Federal, State, Local not specified - PPO

## 2017-10-17 DIAGNOSIS — E039 Hypothyroidism, unspecified: Secondary | ICD-10-CM

## 2017-10-17 NOTE — Telephone Encounter (Signed)
Per TA ok to just have pt come to have FT4 & TSH drawn then we can sched appt if out of whack/pt agrees/changed to lab visit same time and created future order for today/thx dmf

## 2017-10-18 LAB — TSH: TSH: 1.84 u[IU]/mL (ref 0.35–4.50)

## 2017-10-18 LAB — T4, FREE: Free T4: 0.96 ng/dL (ref 0.60–1.60)

## 2017-10-18 NOTE — Progress Notes (Signed)
CC: Knee pain  [id number]  Sabrina Mcdaniel is a 52 y.o. female coming in with complaint of bilateral knee pain. She has been walking quite a bit and developed pain 2 weeks ago. She did some squats which she believes caused the pain. Most of her pain is on the anterior portion of the knee. Mild swelling, worse with stairs or putting pressure on knees.   .  Patient has been seen previously for more of a polyarthralgia that was found to be more hypothyroidism.  Patient states that things have been doing very well including the knee pain seems to be worse.     Past Medical History:  Diagnosis Date  . Anemia    Iron deficiency  . B12 deficiency   . Cholelithiasis   . Depression   . GERD (gastroesophageal reflux disease)   . Migraines   . Osteopenia    Past Surgical History:  Procedure Laterality Date  . ABDOMINAL HYSTERECTOMY  2009   anterior and posterior repair  . COLONOSCOPY  2011  . ESOPHAGOGASTRODUODENOSCOPY ENDOSCOPY    . HERNIA REPAIR  2014   hiatal  . LASIK  2008  . TONSILLECTOMY    . TUBAL LIGATION  1999   Social History   Socioeconomic History  . Marital status: Married    Spouse name: None  . Number of children: None  . Years of education: None  . Highest education level: None  Social Needs  . Financial resource strain: None  . Food insecurity - worry: None  . Food insecurity - inability: None  . Transportation needs - medical: None  . Transportation needs - non-medical: None  Occupational History  . None  Tobacco Use  . Smoking status: Former Smoker    Last attempt to quit: 02/14/1998    Years since quitting: 19.6  . Smokeless tobacco: Never Used  Substance and Sexual Activity  . Alcohol use: No  . Drug use: No  . Sexual activity: None  Other Topics Concern  . None  Social History Narrative  . None   No Known Allergies Family History  Problem Relation Age of Onset  . Cancer Mother 65       breast      Past medical history, social,  surgical and family history all reviewed in electronic medical record.  No pertanent information unless stated regarding to the chief complaint.   Review of Systems:Review of systems updated and as accurate as of 10/19/17  No headache, visual changes, nausea, vomiting, diarrhea, constipation, dizziness, abdominal pain, skin rash, fevers, chills, night sweats, weight loss, swollen lymph nodes, body aches, joint swelling, muscle aches, chest pain, shortness of breath, mood changes.   Objective  Blood pressure 128/88, pulse 63, height 5\' 2"  (1.575 m), weight 180 lb (81.6 kg), SpO2 98 %. Systems examined below as of 10/19/17   General: No apparent distress alert and oriented x3 mood and affect normal, dressed appropriately.  HEENT: Pupils equal, extraocular movements intact  Respiratory: Patient's speak in full sentences and does not appear short of breath  Cardiovascular: No lower extremity edema, non tender, no erythema  Skin: Warm dry intact with no signs of infection or rash on extremities or on axial skeleton.  Abdomen: Soft nontender  Neuro: Cranial nerves II through XII are intact, neurovascularly intact in all extremities with 2+ DTRs and 2+ pulses.  Lymph: No lymphadenopathy of posterior or anterior cervical chain or axillae bilaterally.  Gait normal with good balance and coordination.  MSK:  Non tender with full range of motion and good stability and symmetric strength and tone of shoulders, elbows, wrist, hip, and ankles bilaterally.  Knee: Bilateral Normal to inspection with no erythema or effusion or obvious bony abnormalities. Lateral tilt TTP over the PFJ ROM full in flexion and extension and lower leg rotation. Ligaments with solid consistent endpoints including ACL, PCL, LCL, MCL. Negative Mcmurray's, Apley's, and Thessalonian tests. painful patellar compression. Patellar glide with moderate crepitus. Patellar and quadriceps tendons unremarkable. Hamstring and quadriceps  strength is normal.  [id number]; 15 additional minutes spent for Therapeutic exercises as stated in above notes.  This included exercises focusing on stretching, strengthening, with significant focus on eccentric aspects.   Long term goals include an improvement in range of motion, strength, endurance as well as avoiding reinjury. Patient's frequency would include in 1-2 times a day, 3-5 times a week for a duration of 6-12 weeks. .Patellofemoral Syndrome  Reviewed anatomy using anatomical model and how PFS occurs.  Given rehab exercises handout for VMO, hip abductors, core, entire kinetic chain including proprioception exercises including cone touches, step downs, hip elevations and turn outs.  Could benefit from PT, regular exercise, upright biking, and a PFS knee brace to assist with tracking abnormalities.  Proper technique shown and discussed handout in great detail with ATC.  All questions were discussed and answered.       Impression and Recommendations:     This case required medical decision making of moderate complexity.      Note: This dictation was prepared with Dragon dictation along with smaller phrase technology. Any transcriptional errors that result from this process are unintentional.

## 2017-10-19 ENCOUNTER — Other Ambulatory Visit: Payer: Self-pay

## 2017-10-19 ENCOUNTER — Ambulatory Visit: Payer: Self-pay

## 2017-10-19 ENCOUNTER — Encounter: Payer: Self-pay | Admitting: Family Medicine

## 2017-10-19 ENCOUNTER — Ambulatory Visit: Payer: Federal, State, Local not specified - PPO | Admitting: Family Medicine

## 2017-10-19 VITALS — BP 128/88 | HR 63 | Ht 62.0 in | Wt 180.0 lb

## 2017-10-19 DIAGNOSIS — M222X2 Patellofemoral disorders, left knee: Secondary | ICD-10-CM

## 2017-10-19 DIAGNOSIS — M25561 Pain in right knee: Secondary | ICD-10-CM

## 2017-10-19 DIAGNOSIS — M25562 Pain in left knee: Secondary | ICD-10-CM

## 2017-10-19 DIAGNOSIS — M222X1 Patellofemoral disorders, right knee: Secondary | ICD-10-CM

## 2017-10-19 MED ORDER — DICLOFENAC SODIUM 2 % TD SOLN: 2.0000 g | Freq: Two times a day (BID) | TRANSDERMAL | 3 refills | Status: DC

## 2017-10-19 NOTE — Patient Instructions (Addendum)
Great to see you  You are doing amazing.  Ice 20 minutes 2 times daily. Usually after activity and before bed. Exercises 3 times a week.  pennsaid pinkie amount topically 2 times daily as needed.  Biking or swimming would be great  No deep squats or jumping.  Will get better in weeks See em again in 4 weeks if not perfect

## 2017-10-19 NOTE — Assessment & Plan Note (Signed)
Bilateral patellofemoral syndrome.  We discussed icing regimen, topical anti-inflammatories which was prescribed today.  We discussed avoiding certain activities such as deep squats or jumping.  Follow-up again in 4 weeks.

## 2017-10-31 ENCOUNTER — Encounter: Payer: Self-pay | Admitting: Family Medicine

## 2017-11-23 ENCOUNTER — Ambulatory Visit: Payer: Federal, State, Local not specified - PPO | Admitting: Family Medicine

## 2017-11-29 ENCOUNTER — Other Ambulatory Visit: Payer: Self-pay | Admitting: Nurse Practitioner

## 2017-11-29 DIAGNOSIS — E039 Hypothyroidism, unspecified: Secondary | ICD-10-CM

## 2017-12-06 ENCOUNTER — Other Ambulatory Visit: Payer: Self-pay

## 2017-12-06 ENCOUNTER — Telehealth: Payer: Self-pay | Admitting: Family Medicine

## 2017-12-06 DIAGNOSIS — E039 Hypothyroidism, unspecified: Secondary | ICD-10-CM

## 2017-12-06 NOTE — Telephone Encounter (Signed)
Copied from [place] [id number]. Topic: Quick Communication - See Telephone Encounter >> Dec 06, 2017  9:41 AM Burnis Medin, NT wrote: CRM for notification. See Telephone encounter for: Patient called and wanted to know why her levothyroxine (SYNTHROID, LEVOTHROID) 75 MCG tablet is not written for a  Year since she is going to be taking this medication long term and would like a call back.   12/06/17.

## 2017-12-06 NOTE — Telephone Encounter (Signed)
Per TA ordered TSH & FT4/pt aware and scheduled for 4.6.18 @ 8am and advised that we need to ensure that this dosage has stabalized her numbers and not trying to keep her from getting her medication/thx dmf

## 2017-12-06 NOTE — Telephone Encounter (Signed)
TA-Pt is asking why her Thyroid med was not sent in for 1 year since it is a long-term med/At OV on 12.13.18 you have noted "Taking 42mcg qd; TSH was low 2 months ago"/Unsure what you would like for me to tell her? Would you like for her to come in to just have labs redrawn at a certain time and fill until then? Or does she need an OV? Plz advise/thx dmf

## 2017-12-06 NOTE — Telephone Encounter (Signed)
Yes she can just come in for labs- TSH and Ft4.  Thanks!

## 2017-12-14 ENCOUNTER — Encounter: Payer: Self-pay | Admitting: Family Medicine

## 2017-12-14 DIAGNOSIS — Z803 Family history of malignant neoplasm of breast: Secondary | ICD-10-CM | POA: Diagnosis not present

## 2017-12-14 DIAGNOSIS — Z1231 Encounter for screening mammogram for malignant neoplasm of breast: Secondary | ICD-10-CM | POA: Diagnosis not present

## 2017-12-21 ENCOUNTER — Other Ambulatory Visit (INDEPENDENT_AMBULATORY_CARE_PROVIDER_SITE_OTHER): Payer: Federal, State, Local not specified - PPO

## 2017-12-21 DIAGNOSIS — E039 Hypothyroidism, unspecified: Secondary | ICD-10-CM | POA: Diagnosis not present

## 2017-12-22 LAB — T4, FREE: Free T4: 0.88 ng/dL (ref 0.60–1.60)

## 2017-12-22 LAB — TSH: TSH: 2.06 u[IU]/mL (ref 0.35–4.50)

## 2017-12-23 ENCOUNTER — Other Ambulatory Visit: Payer: Federal, State, Local not specified - PPO

## 2018-01-29 ENCOUNTER — Other Ambulatory Visit: Payer: Self-pay | Admitting: Nurse Practitioner

## 2018-01-29 DIAGNOSIS — E039 Hypothyroidism, unspecified: Secondary | ICD-10-CM

## 2018-03-06 ENCOUNTER — Encounter: Payer: Self-pay | Admitting: Family Medicine

## 2018-03-06 DIAGNOSIS — E039 Hypothyroidism, unspecified: Secondary | ICD-10-CM

## 2018-03-08 MED ORDER — LEVOTHYROXINE SODIUM 75 MCG PO TABS: 75.0000 ug | ORAL_TABLET | Freq: Every day | ORAL | 1 refills | Status: DC

## 2018-03-08 MED ORDER — LEVOTHYROXINE SODIUM 75 MCG PO TABS: 75.0000 ug | ORAL_TABLET | Freq: Every day | ORAL | 0 refills | Status: DC

## 2018-03-16 DIAGNOSIS — L821 Other seborrheic keratosis: Secondary | ICD-10-CM | POA: Diagnosis not present

## 2018-03-16 DIAGNOSIS — L738 Other specified follicular disorders: Secondary | ICD-10-CM | POA: Diagnosis not present

## 2018-04-11 NOTE — Progress Notes (Signed)
  [facility] [address] [place], [place] [address] Phone: [phone] Subjective:    CC: foot pain   [id number]  Sabrina Mcdaniel is a 52 y.o. female coming in with complaint of knee pain. Knees are fine today. States she wants to talk about getting fitted for orthotics.    Procedure Note   Patient was fitted for a : standard, cushioned, semi-rigid orthotic. The orthotic was heated and afterward the patient patient seated position and molded The patient was positioned in subtalar neutral position and 10 degrees of ankle dorsiflexion in a weight bearing stance. After completion of molding, patient did have orthotic management The blank was ground to a stable position for weight bearing. Size: 8 Base: Carbon fiber Additional Posting and Padding: 270/90 bilateral transverse The patient ambulated these, and they were very comfortable.

## 2018-04-12 ENCOUNTER — Encounter: Payer: Self-pay | Admitting: Family Medicine

## 2018-04-12 ENCOUNTER — Ambulatory Visit: Payer: Federal, State, Local not specified - PPO | Admitting: Family Medicine

## 2018-04-12 DIAGNOSIS — M84375D Stress fracture, left foot, subsequent encounter for fracture with routine healing: Secondary | ICD-10-CM

## 2018-04-12 NOTE — Assessment & Plan Note (Signed)
Healed.  Custom orthotics given today.  Abnormality in gait.  Warned to increase wear slowly.  Discussed icing regimen and home exercise.  Discussed which activities to doing which wants to avoid.

## 2018-04-12 NOTE — Patient Instructions (Addendum)
Good to see you  Sabrina Mcdaniel is your friend.  We will get you in orthotics.  Wear them 2 hours the first day then increase 1 hour a day thereafter  Should help the back and knees as well  Call us if we need to make adjustments You know where I am if you need me

## 2018-04-24 DIAGNOSIS — M9904 Segmental and somatic dysfunction of sacral region: Secondary | ICD-10-CM | POA: Diagnosis not present

## 2018-04-24 DIAGNOSIS — M9903 Segmental and somatic dysfunction of lumbar region: Secondary | ICD-10-CM | POA: Diagnosis not present

## 2018-04-24 DIAGNOSIS — M5417 Radiculopathy, lumbosacral region: Secondary | ICD-10-CM | POA: Diagnosis not present

## 2018-04-24 DIAGNOSIS — M9902 Segmental and somatic dysfunction of thoracic region: Secondary | ICD-10-CM | POA: Diagnosis not present

## 2018-04-25 DIAGNOSIS — M5417 Radiculopathy, lumbosacral region: Secondary | ICD-10-CM | POA: Diagnosis not present

## 2018-04-25 DIAGNOSIS — M9904 Segmental and somatic dysfunction of sacral region: Secondary | ICD-10-CM | POA: Diagnosis not present

## 2018-04-25 DIAGNOSIS — M9902 Segmental and somatic dysfunction of thoracic region: Secondary | ICD-10-CM | POA: Diagnosis not present

## 2018-04-25 DIAGNOSIS — M9903 Segmental and somatic dysfunction of lumbar region: Secondary | ICD-10-CM | POA: Diagnosis not present

## 2018-04-26 DIAGNOSIS — M9903 Segmental and somatic dysfunction of lumbar region: Secondary | ICD-10-CM | POA: Diagnosis not present

## 2018-04-26 DIAGNOSIS — M9902 Segmental and somatic dysfunction of thoracic region: Secondary | ICD-10-CM | POA: Diagnosis not present

## 2018-04-26 DIAGNOSIS — M5417 Radiculopathy, lumbosacral region: Secondary | ICD-10-CM | POA: Diagnosis not present

## 2018-04-26 DIAGNOSIS — M9904 Segmental and somatic dysfunction of sacral region: Secondary | ICD-10-CM | POA: Diagnosis not present

## 2018-04-27 ENCOUNTER — Encounter: Payer: Self-pay | Admitting: Family Medicine

## 2018-04-27 DIAGNOSIS — M9902 Segmental and somatic dysfunction of thoracic region: Secondary | ICD-10-CM | POA: Diagnosis not present

## 2018-04-27 DIAGNOSIS — M9903 Segmental and somatic dysfunction of lumbar region: Secondary | ICD-10-CM | POA: Diagnosis not present

## 2018-04-27 DIAGNOSIS — M9904 Segmental and somatic dysfunction of sacral region: Secondary | ICD-10-CM | POA: Diagnosis not present

## 2018-04-27 DIAGNOSIS — M5417 Radiculopathy, lumbosacral region: Secondary | ICD-10-CM | POA: Diagnosis not present

## 2018-05-01 DIAGNOSIS — M5417 Radiculopathy, lumbosacral region: Secondary | ICD-10-CM | POA: Diagnosis not present

## 2018-05-01 DIAGNOSIS — M9903 Segmental and somatic dysfunction of lumbar region: Secondary | ICD-10-CM | POA: Diagnosis not present

## 2018-05-01 DIAGNOSIS — M9902 Segmental and somatic dysfunction of thoracic region: Secondary | ICD-10-CM | POA: Diagnosis not present

## 2018-05-01 DIAGNOSIS — M9904 Segmental and somatic dysfunction of sacral region: Secondary | ICD-10-CM | POA: Diagnosis not present

## 2018-05-04 DIAGNOSIS — M9903 Segmental and somatic dysfunction of lumbar region: Secondary | ICD-10-CM | POA: Diagnosis not present

## 2018-05-04 DIAGNOSIS — M9904 Segmental and somatic dysfunction of sacral region: Secondary | ICD-10-CM | POA: Diagnosis not present

## 2018-05-04 DIAGNOSIS — M9902 Segmental and somatic dysfunction of thoracic region: Secondary | ICD-10-CM | POA: Diagnosis not present

## 2018-05-04 DIAGNOSIS — M5417 Radiculopathy, lumbosacral region: Secondary | ICD-10-CM | POA: Diagnosis not present

## 2018-05-06 DIAGNOSIS — M9902 Segmental and somatic dysfunction of thoracic region: Secondary | ICD-10-CM | POA: Diagnosis not present

## 2018-05-06 DIAGNOSIS — M9904 Segmental and somatic dysfunction of sacral region: Secondary | ICD-10-CM | POA: Diagnosis not present

## 2018-05-06 DIAGNOSIS — M9903 Segmental and somatic dysfunction of lumbar region: Secondary | ICD-10-CM | POA: Diagnosis not present

## 2018-05-06 DIAGNOSIS — M5417 Radiculopathy, lumbosacral region: Secondary | ICD-10-CM | POA: Diagnosis not present

## 2018-05-08 DIAGNOSIS — M9902 Segmental and somatic dysfunction of thoracic region: Secondary | ICD-10-CM | POA: Diagnosis not present

## 2018-05-08 DIAGNOSIS — M9904 Segmental and somatic dysfunction of sacral region: Secondary | ICD-10-CM | POA: Diagnosis not present

## 2018-05-08 DIAGNOSIS — M5417 Radiculopathy, lumbosacral region: Secondary | ICD-10-CM | POA: Diagnosis not present

## 2018-05-08 DIAGNOSIS — M9903 Segmental and somatic dysfunction of lumbar region: Secondary | ICD-10-CM | POA: Diagnosis not present

## 2018-05-10 DIAGNOSIS — M9902 Segmental and somatic dysfunction of thoracic region: Secondary | ICD-10-CM | POA: Diagnosis not present

## 2018-05-10 DIAGNOSIS — M5417 Radiculopathy, lumbosacral region: Secondary | ICD-10-CM | POA: Diagnosis not present

## 2018-05-10 DIAGNOSIS — M9903 Segmental and somatic dysfunction of lumbar region: Secondary | ICD-10-CM | POA: Diagnosis not present

## 2018-05-10 DIAGNOSIS — M9904 Segmental and somatic dysfunction of sacral region: Secondary | ICD-10-CM | POA: Diagnosis not present

## 2018-05-12 DIAGNOSIS — M9902 Segmental and somatic dysfunction of thoracic region: Secondary | ICD-10-CM | POA: Diagnosis not present

## 2018-05-12 DIAGNOSIS — M5417 Radiculopathy, lumbosacral region: Secondary | ICD-10-CM | POA: Diagnosis not present

## 2018-05-12 DIAGNOSIS — M9903 Segmental and somatic dysfunction of lumbar region: Secondary | ICD-10-CM | POA: Diagnosis not present

## 2018-05-12 DIAGNOSIS — M9904 Segmental and somatic dysfunction of sacral region: Secondary | ICD-10-CM | POA: Diagnosis not present

## 2018-05-15 DIAGNOSIS — M9903 Segmental and somatic dysfunction of lumbar region: Secondary | ICD-10-CM | POA: Diagnosis not present

## 2018-05-15 DIAGNOSIS — M9902 Segmental and somatic dysfunction of thoracic region: Secondary | ICD-10-CM | POA: Diagnosis not present

## 2018-05-15 DIAGNOSIS — M9904 Segmental and somatic dysfunction of sacral region: Secondary | ICD-10-CM | POA: Diagnosis not present

## 2018-05-15 DIAGNOSIS — M5417 Radiculopathy, lumbosacral region: Secondary | ICD-10-CM | POA: Diagnosis not present

## 2018-05-23 DIAGNOSIS — M9904 Segmental and somatic dysfunction of sacral region: Secondary | ICD-10-CM | POA: Diagnosis not present

## 2018-05-23 DIAGNOSIS — M9903 Segmental and somatic dysfunction of lumbar region: Secondary | ICD-10-CM | POA: Diagnosis not present

## 2018-05-23 DIAGNOSIS — M9902 Segmental and somatic dysfunction of thoracic region: Secondary | ICD-10-CM | POA: Diagnosis not present

## 2018-05-23 DIAGNOSIS — M5417 Radiculopathy, lumbosacral region: Secondary | ICD-10-CM | POA: Diagnosis not present

## 2018-05-26 DIAGNOSIS — M9904 Segmental and somatic dysfunction of sacral region: Secondary | ICD-10-CM | POA: Diagnosis not present

## 2018-05-26 DIAGNOSIS — M5417 Radiculopathy, lumbosacral region: Secondary | ICD-10-CM | POA: Diagnosis not present

## 2018-05-26 DIAGNOSIS — M9902 Segmental and somatic dysfunction of thoracic region: Secondary | ICD-10-CM | POA: Diagnosis not present

## 2018-05-26 DIAGNOSIS — M9903 Segmental and somatic dysfunction of lumbar region: Secondary | ICD-10-CM | POA: Diagnosis not present

## 2018-05-30 DIAGNOSIS — M9903 Segmental and somatic dysfunction of lumbar region: Secondary | ICD-10-CM | POA: Diagnosis not present

## 2018-05-30 DIAGNOSIS — M5417 Radiculopathy, lumbosacral region: Secondary | ICD-10-CM | POA: Diagnosis not present

## 2018-05-30 DIAGNOSIS — M9902 Segmental and somatic dysfunction of thoracic region: Secondary | ICD-10-CM | POA: Diagnosis not present

## 2018-05-30 DIAGNOSIS — M9904 Segmental and somatic dysfunction of sacral region: Secondary | ICD-10-CM | POA: Diagnosis not present

## 2018-06-02 DIAGNOSIS — M9903 Segmental and somatic dysfunction of lumbar region: Secondary | ICD-10-CM | POA: Diagnosis not present

## 2018-06-02 DIAGNOSIS — M9904 Segmental and somatic dysfunction of sacral region: Secondary | ICD-10-CM | POA: Diagnosis not present

## 2018-06-02 DIAGNOSIS — M5417 Radiculopathy, lumbosacral region: Secondary | ICD-10-CM | POA: Diagnosis not present

## 2018-06-02 DIAGNOSIS — M9902 Segmental and somatic dysfunction of thoracic region: Secondary | ICD-10-CM | POA: Diagnosis not present

## 2018-06-17 DIAGNOSIS — M9902 Segmental and somatic dysfunction of thoracic region: Secondary | ICD-10-CM | POA: Diagnosis not present

## 2018-06-17 DIAGNOSIS — M9903 Segmental and somatic dysfunction of lumbar region: Secondary | ICD-10-CM | POA: Diagnosis not present

## 2018-06-17 DIAGNOSIS — M9904 Segmental and somatic dysfunction of sacral region: Secondary | ICD-10-CM | POA: Diagnosis not present

## 2018-06-17 DIAGNOSIS — M5417 Radiculopathy, lumbosacral region: Secondary | ICD-10-CM | POA: Diagnosis not present

## 2018-06-19 ENCOUNTER — Ambulatory Visit: Payer: Federal, State, Local not specified - PPO | Admitting: Family Medicine

## 2018-06-19 ENCOUNTER — Encounter: Payer: Self-pay | Admitting: Family Medicine

## 2018-06-19 DIAGNOSIS — B029 Zoster without complications: Secondary | ICD-10-CM | POA: Insufficient documentation

## 2018-06-19 MED ORDER — VALACYCLOVIR HCL 1 G PO TABS: 1000.0000 mg | ORAL_TABLET | Freq: Three times a day (TID) | ORAL | 0 refills | Status: AC

## 2018-06-19 NOTE — Progress Notes (Signed)
Sabrina Mcdaniel - 52 y.o. female MRN [id number]  Date of birth: Jul 02, 1966  Subjective Chief Complaint  Patient presents with  . Herpes Zoster    HPI Sabrina Mcdaniel is a 52 y.o. female here today with rash concerning for shingles.  She reports that symptoms began about 1 week ago.  Started with pain in upper back which she describes as "someone pressing a baseball that is on fire to her skin."  She then started to have rash just below axilla and around to chest.  Lesions were "blister like" and she thought initially this was poison ivy.  She was seen by her chiropractor because she thought she needed an adjustment for her back and he told her that he though she may have shingles.  Areas on chest are still present and not really to painful but back in painful.  She denies fever, chills, headaches.   ROS:  A comprehensive ROS was completed and negative except as noted per HPI  No Known Allergies  Past Medical History:  Diagnosis Date  . Anemia    Iron deficiency  . B12 deficiency   . Cholelithiasis   . Depression   . GERD (gastroesophageal reflux disease)   . Migraines   . Osteopenia     Past Surgical History:  Procedure Laterality Date  . ABDOMINAL HYSTERECTOMY  2009   anterior and posterior repair  . COLONOSCOPY  2011  . ESOPHAGOGASTRODUODENOSCOPY ENDOSCOPY    . HERNIA REPAIR  2014   hiatal  . LASIK  2008  . TONSILLECTOMY    . TUBAL LIGATION  1999    Social History   Socioeconomic History  . Marital status: Married    Spouse name: Not on file  . Number of children: Not on file  . Years of education: Not on file  . Highest education level: Not on file  Occupational History  . Not on file  Social Needs  . Financial resource strain: Not on file  . Food insecurity:    Worry: Not on file    Inability: Not on file  . Transportation needs:    Medical: Not on file    Non-medical: Not on file  Tobacco Use  . Smoking status: Former Smoker    Last attempt to quit:  02/14/1998    Years since quitting: 20.3  . Smokeless tobacco: Never Used  Substance and Sexual Activity  . Alcohol use: No  . Drug use: No  . Sexual activity: Not on file  Lifestyle  . Physical activity:    Days per week: Not on file    Minutes per session: Not on file  . Stress: Not on file  Relationships  . Social connections:    Talks on phone: Not on file    Gets together: Not on file    Attends religious service: Not on file    Active member of club or organization: Not on file    Attends meetings of clubs or organizations: Not on file    Relationship status: Not on file  Other Topics Concern  . Not on file  Social History Narrative  . Not on file    Family History  Problem Relation Age of Onset  . Cancer Mother 2       breast     Health Maintenance  Topic Date Due  . HIV Screening  11/28/1979  . TETANUS/TDAP  11/28/1983  . PAP SMEAR  11/27/1985  . INFLUENZA VACCINE  04/20/2018  . MAMMOGRAM  12/15/2019  . COLONOSCOPY  01/22/2020    ----------------------------------------------------------------------------------------------------------------------------------------------------------------------------------------------------------------- Physical Exam BP 130/70   Pulse 74   Temp 97.9 F (36.6 C)   Ht 5\' 2"  (1.575 m)   Wt 191 lb (86.6 kg)   SpO2 97%   BMI 34.93 kg/m   Physical Exam  Constitutional: She is oriented to person, place, and time. She appears well-nourished. No distress.  Neurological: She is alert and oriented to person, place, and time.  Skin:  Multiple erythematous papules just below axilla and on L side of chest in dermatomal pattern. There is some scabbed over areas and the appeared vesicular at one time.   Psychiatric: She has a normal mood and affect. Her behavior is normal.     ------------------------------------------------------------------------------------------------------------------------------------------------------------------------------------------------------------------- Assessment and Plan  Zoster without complications -Rash and symptoms consistent with shingles -RX for valtrex x1 week.  -Discussed that pain usually resolves but can be prolonged.  She should follow up if this occurs.

## 2018-06-19 NOTE — Assessment & Plan Note (Signed)
-  Rash and symptoms consistent with shingles -RX for valtrex x1 week.  -Discussed that pain usually resolves but can be prolonged.  She should follow up if this occurs.

## 2018-06-19 NOTE — Patient Instructions (Signed)
Start valtrex medication I hope you feel better soon, let us know if this is getting worse.   Shingles Shingles is an infection that causes a painful skin rash and fluid-filled blisters. Shingles is caused by the same virus that causes chickenpox. Shingles only develops in people who:  Have had chickenpox.  Have gotten the chickenpox vaccine. (This is rare.)  The first symptoms of shingles may be itching, tingling, or pain in an area on your skin. A rash will follow in a few days or weeks. The rash is usually on one side of the body in a bandlike or beltlike pattern. Over time, the rash turns into fluid-filled blisters that break open, scab over, and dry up. Medicines may:  Help you manage pain.  Help you recover more quickly.  Help to prevent long-term problems.  Follow these instructions at home: Medicines  Take medicines only as told by your doctor.  Apply an anti-itch or numbing cream to the affected area as told by your doctor. Blister and Rash Care  Take a cool bath or put cool compresses on the area of the rash or blisters as told by your doctor. This may help with pain and itching.  Keep your rash covered with a loose bandage (dressing). Wear loose-fitting clothing.  Keep your rash and blisters clean with mild soap and cool water or as told by your doctor.  Check your rash every day for signs of infection. These include redness, swelling, and pain that lasts or gets worse.  Do not pick your blisters.  Do not scratch your rash. General instructions  Rest as told by your doctor.  Keep all follow-up visits as told by your doctor. This is important.  Until your blisters scab over, your infection can cause chickenpox in people who have never had it or been vaccinated against it. To prevent this from happening, avoid touching other people or being around other people, especially: ? Babies. ? Pregnant women. ? Children who have eczema. ? Elderly people who have  transplants. ? People who have chronic illnesses, such as leukemia or AIDS. Contact a doctor if:  Your pain does not get better with medicine.  Your pain does not get better after the rash heals.  Your rash looks infected. Signs of infection include: ? Redness. ? Swelling. ? Pain that lasts or gets worse. Get help right away if:  The rash is on your face or nose.  You have pain in your face, pain around your eye area, or loss of feeling on one side of your face.  You have ear pain or you have ringing in your ear.  You have loss of taste.  Your condition gets worse. This information is not intended to replace advice given to you by your health care provider. Make sure you discuss any questions you have with your health care provider. Document Released: 02/23/2008 Document Revised: 05/02/2016 Document Reviewed: 06/18/2014 Elsevier Interactive Patient Education  [employer].

## 2018-07-27 DIAGNOSIS — M5417 Radiculopathy, lumbosacral region: Secondary | ICD-10-CM | POA: Diagnosis not present

## 2018-07-27 DIAGNOSIS — M9904 Segmental and somatic dysfunction of sacral region: Secondary | ICD-10-CM | POA: Diagnosis not present

## 2018-07-27 DIAGNOSIS — M9902 Segmental and somatic dysfunction of thoracic region: Secondary | ICD-10-CM | POA: Diagnosis not present

## 2018-07-27 DIAGNOSIS — M9903 Segmental and somatic dysfunction of lumbar region: Secondary | ICD-10-CM | POA: Diagnosis not present

## 2018-07-30 ENCOUNTER — Encounter: Payer: Self-pay | Admitting: Family Medicine

## 2018-07-31 ENCOUNTER — Other Ambulatory Visit: Payer: Self-pay | Admitting: Family Medicine

## 2018-07-31 ENCOUNTER — Ambulatory Visit (INDEPENDENT_AMBULATORY_CARE_PROVIDER_SITE_OTHER): Payer: Federal, State, Local not specified - PPO | Admitting: Family Medicine

## 2018-07-31 ENCOUNTER — Encounter: Payer: Self-pay | Admitting: Family Medicine

## 2018-07-31 VITALS — BP 130/74 | HR 68 | Temp 98.0°F | Ht 62.0 in | Wt 190.0 lb

## 2018-07-31 DIAGNOSIS — L255 Unspecified contact dermatitis due to plants, except food: Secondary | ICD-10-CM | POA: Insufficient documentation

## 2018-07-31 DIAGNOSIS — E039 Hypothyroidism, unspecified: Secondary | ICD-10-CM | POA: Diagnosis not present

## 2018-07-31 DIAGNOSIS — E559 Vitamin D deficiency, unspecified: Secondary | ICD-10-CM | POA: Diagnosis not present

## 2018-07-31 DIAGNOSIS — E538 Deficiency of other specified B group vitamins: Secondary | ICD-10-CM

## 2018-07-31 DIAGNOSIS — E611 Iron deficiency: Secondary | ICD-10-CM

## 2018-07-31 DIAGNOSIS — Z0000 Encounter for general adult medical examination without abnormal findings: Secondary | ICD-10-CM | POA: Diagnosis not present

## 2018-07-31 DIAGNOSIS — R21 Rash and other nonspecific skin eruption: Secondary | ICD-10-CM | POA: Diagnosis not present

## 2018-07-31 MED ORDER — CYANOCOBALAMIN 1000 MCG/ML IJ KIT: PACK | INTRAMUSCULAR | 3 refills | Status: DC

## 2018-07-31 MED ORDER — VALACYCLOVIR HCL 1 G PO TABS: 1000.0000 mg | ORAL_TABLET | Freq: Three times a day (TID) | ORAL | 0 refills | Status: DC

## 2018-07-31 MED ORDER — PREDNISONE 20 MG PO TABS: ORAL_TABLET | ORAL | 0 refills | Status: DC

## 2018-07-31 MED ORDER — LEVOTHYROXINE SODIUM 75 MCG PO TABS: 75.0000 ug | ORAL_TABLET | Freq: Every day | ORAL | 1 refills | Status: DC

## 2018-07-31 NOTE — Assessment & Plan Note (Signed)
Check Vit D today. 

## 2018-07-31 NOTE — Assessment & Plan Note (Signed)
New- distribution and appearance does not appear consistent with shingles.  ? Plant dermatitis.  Advised to wash all sheets, bedding, clothes she was wearing this weekend, take prednisone taper as directed. Call or return to clinic prn if these symptoms worsen or fail to improve as anticipated. The patient indicates understanding of these issues and agrees with the plan.

## 2018-07-31 NOTE — Patient Instructions (Signed)
Great to see you. I will call you with your lab results from today and you can view them online.    Poison Ivy Dermatitis Poison ivy dermatitis is redness and soreness (inflammation) of the skin. It is caused by a chemical that is found on the leaves of the poison ivy plant. You may also have itching, a rash, and blisters. Symptoms often clear up in 1-2 weeks. You may get this condition by touching a poison ivy plant. You can also get it by touching something that has the chemical on it. This may include animals or objects that have come in contact with the plant. Follow these instructions at home: General instructions  Take or apply over-the-counter and prescription medicines only as told by your doctor.  If you touch poison ivy, wash your skin with soap and cold water right away.  Use hydrocortisone creams or calamine lotion as needed to help with itching.  Take oatmeal baths as needed. Use colloidal oatmeal. You can get this at a pharmacy or grocery store. Follow the instructions on the package.  Do not scratch or rub your skin.  While you have the rash, wash your clothes right after you wear them. Prevention  Know what poison ivy looks like so you can avoid it. This plant has three leaves with flowering branches on a single stem. The leaves are glossy. They have uneven edges that come to a point at the front.  If you have touched poison ivy, wash with soap and water right away. Be sure to wash under your fingernails.  When hiking or camping, wear long pants, a long-sleeved shirt, tall socks, and hiking boots. You can also use a lotion on your skin that helps to prevent contact with the chemical on the plant.  If you think that your clothes or outdoor gear came in contact with poison ivy, rinse them off with a garden hose before you bring them inside your house. Contact a doctor if:  You have open sores in the rash area.  You have more redness, swelling, or pain in the affected  area.  You have redness that spreads beyond the rash area.  You have fluid, blood, or pus coming from the affected area.  You have a fever.  You have a rash over a large area of your body.  You have a rash on your eyes, mouth, or genitals.  Your rash does not get better after a few days. Get help right away if:  Your face swells or your eyes swell shut.  You have trouble breathing.  You have trouble swallowing. This information is not intended to replace advice given to you by your health care provider. Make sure you discuss any questions you have with your health care provider. Document Released: 10/09/2010 Document Revised: 02/12/2016 Document Reviewed: 02/12/2015 Elsevier Interactive Patient Education  [employer].

## 2018-07-31 NOTE — Assessment & Plan Note (Signed)
Continue current dose of synthroid.  Check labs today. 

## 2018-07-31 NOTE — Progress Notes (Signed)
Subjective:   Patient ID: Sabrina Mcdaniel, female    DOB: 1966-04-23, 52 y.o.   MRN: [id number]  Sabrina Mcdaniel is a pleasant 52 y.o. year old female who presents to clinic today with Annual Exam (Wants to discuss shingles vaccine-noticed some areas on her left side/trunk area 4 days ago. Has not started taking Valtrex that was called in today. )  on 07/31/2018  HPI:  Health Maintenance  Topic Date Due  . PAP SMEAR  07/31/2018 (Originally 11/27/1985)  . TETANUS/TDAP  08/01/2019 (Originally 11/28/1983)  . HIV Screening  08/01/2019 (Originally 11/28/1979)  . INFLUENZA VACCINE  08/10/2019 (Originally 04/20/2018)  . MAMMOGRAM  12/15/2019  . COLONOSCOPY  01/22/2020   Remote h/o hysterectomy. Mammogram 11/2017.  Hypothyroidism- clinically euthyroid on current dose of synthroid- 75 mcg daily. Denies any symptoms of hypo or hyperthyroidism.   Lab Results  Component Value Date   TSH 2.06 12/21/2017   Rash- on left arm, left leg, left side of her abdomen.  Very itchy.  She had shingles on her left breast and wonders if this is shingles.  Rash is itchy, not painful.  Her shingles rash was very painful. She was working in the yard this weekend.  No current outpatient medications on file prior to visit.   No current facility-administered medications on file prior to visit.     No Known Allergies  Past Medical History:  Diagnosis Date  . Anemia    Iron deficiency  . B12 deficiency   . Cholelithiasis   . Depression   . GERD (gastroesophageal reflux disease)   . Migraines   . Osteopenia     Past Surgical History:  Procedure Laterality Date  . ABDOMINAL HYSTERECTOMY  2009   anterior and posterior repair  . COLONOSCOPY  2011  . ESOPHAGOGASTRODUODENOSCOPY ENDOSCOPY    . HERNIA REPAIR  2014   hiatal  . LASIK  2008  . TONSILLECTOMY    . TUBAL LIGATION  1999    Family History  Problem Relation Age of Onset  . Cancer Mother 36       breast     Social History   Socioeconomic  History  . Marital status: Married    Spouse name: Not on file  . Number of children: Not on file  . Years of education: Not on file  . Highest education level: Not on file  Occupational History  . Not on file  Social Needs  . Financial resource strain: Not on file  . Food insecurity:    Worry: Not on file    Inability: Not on file  . Transportation needs:    Medical: Not on file    Non-medical: Not on file  Tobacco Use  . Smoking status: Former Smoker    Last attempt to quit: 02/14/1998    Years since quitting: 20.4  . Smokeless tobacco: Never Used  Substance and Sexual Activity  . Alcohol use: No  . Drug use: No  . Sexual activity: Not on file  Lifestyle  . Physical activity:    Days per week: Not on file    Minutes per session: Not on file  . Stress: Not on file  Relationships  . Social connections:    Talks on phone: Not on file    Gets together: Not on file    Attends religious service: Not on file    Active member of club or organization: Not on file    Attends meetings of clubs or organizations:  Not on file    Relationship status: Not on file  . Intimate partner violence:    Fear of current or ex partner: Not on file    Emotionally abused: Not on file    Physically abused: Not on file    Forced sexual activity: Not on file  Other Topics Concern  . Not on file  Social History Narrative  . Not on file   The PMH, PSH, Social History, Family History, Medications, and allergies have been reviewed in [facility], and have been updated if relevant.  Review of Systems  Constitutional: Negative.   HENT: Negative.   Eyes: Negative.   Respiratory: Negative.   Cardiovascular: Negative.   Gastrointestinal: Negative.   Endocrine: Negative.   Genitourinary: Negative.   Musculoskeletal: Negative.   Skin: Positive for rash.  Allergic/Immunologic: Negative.   Neurological: Negative.   Hematological: Negative.   Psychiatric/Behavioral: Negative.   All other systems  reviewed and are negative.      Objective:    BP 130/74   Pulse 68   Temp 98 F (36.7 C) (Oral)   Ht 5\' 2"  (1.575 m)   Wt 190 lb (86.2 kg)   SpO2 98%   BMI 34.75 kg/m    Physical Exam   General:  Well-developed,well-nourished,in no acute distress; alert,appropriate and cooperative throughout examination Head:  normocephalic and atraumatic.   Eyes:  vision grossly intact, PERRL Ears:  R ear normal and L ear normal externally, TMs clear bilaterally Nose:  no external deformity.   Mouth:  good dentition.   Neck:  No deformities, masses, or tenderness noted. Breasts:  No mass, nodules, thickening, tenderness, bulging, retraction, inflamation, nipple discharge or skin changes noted.   Lungs:  Normal respiratory effort, chest expands symmetrically. Lungs are clear to auscultation, no crackles or wheezes. Heart:  Normal rate and regular rhythm. S1 and S2 normal without gallop, murmur, click, rub or other extra sounds. Abdomen:  Bowel sounds positive,abdomen soft and non-tender without masses, organomegaly or hernias noted. Msk:  No deformity or scoliosis noted of thoracic or lumbar spine.   Extremities:  No clubbing, cyanosis, edema, or deformity noted with normal full range of motion of all joints.   Neurologic:  alert & oriented X3 and gait normal.   Skin: macularpapular scattered rash in mostly linear distribution- left upper arm, left lateral lower arm, left abdomen, left thigh and left lower leg. Cervical Nodes:  No lymphadenopathy noted Axillary Nodes:  No palpable lymphadenopathy Psych:  Cognition and judgment appear intact. Alert and cooperative with normal attention span and concentration. No apparent delusions, illusions, hallucinations       Assessment & Plan:   Well woman exam without gynecological exam  Hypothyroidism, unspecified type - Plan: levothyroxine (SYNTHROID, LEVOTHROID) 75 MCG tablet, Comprehensive metabolic panel, Lipid panel, TSH, T4, free  Vitamin D  deficiency - Plan: Vitamin D (25 hydroxy)  Iron deficiency - Plan: CBC with Differential/Platelet  Vitamin B12 deficiency - Plan: B12  Plant dermatitis No follow-ups on file.

## 2018-07-31 NOTE — Assessment & Plan Note (Signed)
Reviewed preventive care protocols, scheduled due services, and updated immunizations Discussed nutrition, exercise, diet, and healthy lifestyle.  

## 2018-08-01 ENCOUNTER — Encounter: Payer: Self-pay | Admitting: Family Medicine

## 2018-08-01 LAB — LIPID PANEL
CHOL/HDL RATIO: 4
Cholesterol: 203 mg/dL — ABNORMAL HIGH (ref 0–200)
HDL: 56.9 mg/dL (ref >39.00)
LDL Cholesterol: 118 mg/dL — ABNORMAL HIGH (ref 0–99)
NONHDL: 146.1
Triglycerides: 139 mg/dL (ref 0.0–149.0)
VLDL: 27.8 mg/dL (ref 0.0–40.0)

## 2018-08-01 LAB — COMPREHENSIVE METABOLIC PANEL
ALT: 35 U/L (ref 0–35)
AST: 25 U/L (ref 0–37)
Albumin: 4.7 g/dL (ref 3.5–5.2)
Alkaline Phosphatase: 109 U/L (ref 39–117)
BILIRUBIN TOTAL: 0.3 mg/dL (ref 0.2–1.2)
BUN: 17 mg/dL (ref 6–23)
CALCIUM: 10 mg/dL (ref 8.4–10.5)
CHLORIDE: 104 meq/L (ref 96–112)
CO2: 27 meq/L (ref 19–32)
Creatinine, Ser: 0.72 mg/dL (ref 0.40–1.20)
GFR: 89.83 mL/min (ref >60.00)
GLUCOSE: 93 mg/dL (ref 70–99)
Potassium: 4.3 mEq/L (ref 3.5–5.1)
Sodium: 140 mEq/L (ref 135–145)
Total Protein: 7.3 g/dL (ref 6.0–8.3)

## 2018-08-01 LAB — CBC WITH DIFFERENTIAL/PLATELET
BASOS ABS: 0.1 10*3/uL (ref 0.0–0.1)
Basophils Relative: 1.1 % (ref 0.0–3.0)
EOS PCT: 1.6 % (ref 0.0–5.0)
Eosinophils Absolute: 0.1 10*3/uL (ref 0.0–0.7)
HEMATOCRIT: 40.1 % (ref 36.0–46.0)
Hemoglobin: 13.3 g/dL (ref 12.0–15.0)
LYMPHS ABS: 2.6 10*3/uL (ref 0.7–4.0)
LYMPHS PCT: 42.7 % (ref 12.0–46.0)
MCHC: 33.3 g/dL (ref 30.0–36.0)
MCV: 86.8 fl (ref 78.0–100.0)
MONOS PCT: 9.7 % (ref 3.0–12.0)
Monocytes Absolute: 0.6 10*3/uL (ref 0.1–1.0)
NEUTROS ABS: 2.8 10*3/uL (ref 1.4–7.7)
NEUTROS PCT: 44.9 % (ref 43.0–77.0)
PLATELETS: 279 10*3/uL (ref 150.0–400.0)
RBC: 4.62 Mil/uL (ref 3.87–5.11)
RDW: 14.1 % (ref 11.5–15.5)
WBC: 6.2 10*3/uL (ref 4.0–10.5)

## 2018-08-01 LAB — VITAMIN D 25 HYDROXY (VIT D DEFICIENCY, FRACTURES): VITD: 20.15 ng/mL — ABNORMAL LOW (ref 30.00–100.00)

## 2018-08-01 LAB — T4, FREE: Free T4: 0.92 ng/dL (ref 0.60–1.60)

## 2018-08-01 LAB — TSH: TSH: 1.75 u[IU]/mL (ref 0.35–4.50)

## 2018-08-01 LAB — VITAMIN B12: VITAMIN B 12: 374 pg/mL (ref 211–911)

## 2018-08-03 ENCOUNTER — Other Ambulatory Visit: Payer: Self-pay

## 2018-08-03 DIAGNOSIS — E559 Vitamin D deficiency, unspecified: Secondary | ICD-10-CM

## 2018-08-03 MED ORDER — VITAMIN D (ERGOCALCIFEROL) 1.25 MG (50000 UNIT) PO CAPS: 50000.0000 [IU] | ORAL_CAPSULE | ORAL | 0 refills | Status: AC

## 2018-08-03 NOTE — Progress Notes (Signed)
Sent Rx for Vit-Kenedi Cilia 50k 1qwk #6 then start OTC 1.6k IU qd and recheck lab in 8-12 weeks/pt aware/future order created/she will call back to sched lab visit/thx dmf

## 2018-10-18 ENCOUNTER — Other Ambulatory Visit (INDEPENDENT_AMBULATORY_CARE_PROVIDER_SITE_OTHER): Payer: Federal, State, Local not specified - PPO

## 2018-10-18 DIAGNOSIS — E559 Vitamin D deficiency, unspecified: Secondary | ICD-10-CM

## 2018-10-19 LAB — VITAMIN D 25 HYDROXY (VIT D DEFICIENCY, FRACTURES): VITD: 31.37 ng/mL (ref 30.00–100.00)

## 2018-10-20 ENCOUNTER — Other Ambulatory Visit: Payer: Self-pay

## 2018-10-20 ENCOUNTER — Telehealth: Payer: Self-pay | Admitting: Family Medicine

## 2018-10-20 DIAGNOSIS — E559 Vitamin D deficiency, unspecified: Secondary | ICD-10-CM

## 2018-10-20 MED ORDER — VITAMIN D (ERGOCALCIFEROL) 1.25 MG (50000 UNIT) PO CAPS: 50000.0000 [IU] | ORAL_CAPSULE | ORAL | 0 refills | Status: DC

## 2018-10-20 NOTE — Addendum Note (Signed)
Addended by: Marrion Coy on: 10/20/2018 02:35 PM   Modules accepted: Orders

## 2018-10-20 NOTE — Telephone Encounter (Signed)
Copied from [place] [id number]. Topic: Quick Communication - See Telephone Encounter >> Oct 20, 2018 10:35 AM Rutherford Nail, NT wrote: CRM for notification. See Telephone encounter for: 10/20/18. Patient calling and would like to know if someone can send in her Vitamin D3 that Dr Deborra Medina wants her to start according to her labs. Patient would like to pick this up today since she is going to be in that area. Please advise. [id number] [place] ([address]), [place] [address]

## 2018-10-20 NOTE — Telephone Encounter (Signed)
Pt aware Rx was sent in/future order created for lab in 8-12 weeks/she said she will call back to schedule that visit/thx dmf

## 2018-12-13 ENCOUNTER — Other Ambulatory Visit (INDEPENDENT_AMBULATORY_CARE_PROVIDER_SITE_OTHER): Payer: Federal, State, Local not specified - PPO

## 2018-12-13 ENCOUNTER — Other Ambulatory Visit: Payer: Self-pay

## 2018-12-13 DIAGNOSIS — E559 Vitamin D deficiency, unspecified: Secondary | ICD-10-CM

## 2018-12-14 ENCOUNTER — Encounter: Payer: Self-pay | Admitting: Family Medicine

## 2018-12-14 LAB — VITAMIN D 25 HYDROXY (VIT D DEFICIENCY, FRACTURES): VITD: 44.54 ng/mL (ref 30.00–100.00)

## 2018-12-27 ENCOUNTER — Other Ambulatory Visit: Payer: Self-pay | Admitting: Family Medicine

## 2018-12-27 NOTE — Telephone Encounter (Signed)
Requested medication (s) are due for refill today: yes  Requested medication (s) are on the active medication list: yes  Last refill:  10/20/18  Future visit scheduled: no  Notes to clinic:  Doses of 50,000 IU strengths not delegated to NT to refill   Requested Prescriptions  Pending Prescriptions Disp Refills   Vitamin D, Ergocalciferol, (DRISDOL) 1.25 MG (50000 UT) CAPS capsule 12 capsule 0    Sig: Take 1 capsule (50,000 Units total) by mouth every 7 (seven) days.     Endocrinology:  Vitamins - Vitamin D Supplementation Failed - 12/27/2018  4:03 PM      Failed - 50,000 IU strengths are not delegated      Failed - Phosphate in normal range and within 360 days    No results found for: PHOS       Failed - Vitamin D in normal range and within 360 days    Vitamin D2 1, 25 (OH)2  Date Value Ref Range Status  07/08/2017 40 pg/mL Final    Comment:    Marland Kitchen Vitamin D3, 1,25(OH) indicates both endogenous production and supplementation. Vitamin D2, 1,25(OH)2 is an indicator of exogenous sources, such as diet or supplementation.  Interpretation and therapy are based on measurement of Vitamin D,1,25(OH)2, Total. . . This test was developed and its analytical performance characteristics have been determined by [facility], [place], [place]. It has not been cleared or approved by the FDA. This assay has been validated pursuant to the CLIA regulations and is used for clinical purposes. .    Vitamin D3 1, 25 (OH)2  Date Value Ref Range Status  07/08/2017 14 pg/mL Final   Vitamin D 1, 25 (OH)2 Total  Date Value Ref Range Status  07/08/2017 54 18 - 72 pg/mL Final   VITD  Date Value Ref Range Status  12/13/2018 44.54 30.00 - 100.00 ng/mL Final         Passed - Ca in normal range and within 360 days    Calcium  Date Value Ref Range Status  07/31/2018 10.0 8.4 - 10.5 mg/dL Final         Passed - Valid encounter within last 12 months    Recent Outpatient  Visits          4 months ago Well woman exam without gynecological exam   [facility] Senters, Marciano Sequin, MD   6 months ago Zoster without complications   [facility] [place], [place], DO   8 months ago Stress fracture of left foot with routine healing, subsequent encounter   Henry, [place], DO   1 year ago Acute pain of both knees   [place], [place], DO   1 year ago Vitamin D deficiency   [facility] [address] Marciano Sequin, MD

## 2018-12-28 MED ORDER — VITAMIN D (ERGOCALCIFEROL) 1.25 MG (50000 UNIT) PO CAPS: 50000.0000 [IU] | ORAL_CAPSULE | ORAL | 0 refills | Status: DC

## 2018-12-28 NOTE — Telephone Encounter (Signed)
Pt requesting Rx refill Vit D, 1.25 mg Cap . LOV 07/30/2018 .

## 2019-01-01 ENCOUNTER — Encounter: Payer: Self-pay | Admitting: Family Medicine

## 2019-01-01 ENCOUNTER — Other Ambulatory Visit: Payer: Self-pay

## 2019-01-01 MED ORDER — VITAMIN D (ERGOCALCIFEROL) 1.25 MG (50000 UNIT) PO CAPS: 50000.0000 [IU] | ORAL_CAPSULE | ORAL | 0 refills | Status: AC

## 2019-02-06 ENCOUNTER — Other Ambulatory Visit: Payer: Self-pay | Admitting: Family Medicine

## 2019-02-06 DIAGNOSIS — Z1231 Encounter for screening mammogram for malignant neoplasm of breast: Secondary | ICD-10-CM

## 2019-02-21 ENCOUNTER — Ambulatory Visit: Payer: Federal, State, Local not specified - PPO | Admitting: Family Medicine

## 2019-02-28 ENCOUNTER — Ambulatory Visit (INDEPENDENT_AMBULATORY_CARE_PROVIDER_SITE_OTHER): Payer: Federal, State, Local not specified - PPO | Admitting: Family Medicine

## 2019-02-28 ENCOUNTER — Other Ambulatory Visit: Payer: Self-pay

## 2019-02-28 ENCOUNTER — Encounter: Payer: Self-pay | Admitting: Family Medicine

## 2019-02-28 ENCOUNTER — Ambulatory Visit: Payer: Self-pay

## 2019-02-28 VITALS — BP 102/64 | HR 88 | Ht 62.0 in | Wt 195.0 lb

## 2019-02-28 DIAGNOSIS — M7671 Peroneal tendinitis, right leg: Secondary | ICD-10-CM | POA: Diagnosis not present

## 2019-02-28 DIAGNOSIS — M25531 Pain in right wrist: Secondary | ICD-10-CM

## 2019-02-28 NOTE — Assessment & Plan Note (Signed)
Peroneal tendinitis.  Given home exercises, discussed compression, discussed topical anti-inflammatories and icing regimen.  Discussed proper shoes.  Follow-up again in 4 to 6 weeks

## 2019-02-28 NOTE — Patient Instructions (Addendum)
Good to see you.  Ice 20 minutes 2 times daily. Usually after activity and before bed. Exercises 3 times a week.  Body Helix X Fit ankle brace [email or web address] B6 100 mg daily See me again in 6 weeks

## 2019-02-28 NOTE — Progress Notes (Signed)
[facility] [address], [place] [address] Phone: [phone] Subjective:   Fontaine No, am serving as a scribe for Dr. Hulan Saas.   CC: Right foot pain in wrist pain  [id number]  Swathi B Dario is a 53 y.o. female coming in with complaint of right foot pain for a year. Last seen in 04/13/2019 for left foot stress fracture. Pain occurring over lateral mal and lateral heel on the right foot. Pain increases with prolonged standing and walking. If she goes for a walk it may take 2-3 days to recover. Has not been walking more recently.   Is also having right wrist pain. Had a massage back in February. States that she developed pain over the olecranon process. The next day her middle finger was tender with gripping. The elbow pain has improved but if she puts weight on her hand and loads the carpal bones she has pain. Feels like her wrist needs to be popped.       Past Medical History:  Diagnosis Date  . Anemia    Iron deficiency  . B12 deficiency   . Cholelithiasis   . Depression   . GERD (gastroesophageal reflux disease)   . Migraines   . Osteopenia    Past Surgical History:  Procedure Laterality Date  . ABDOMINAL HYSTERECTOMY  2009   anterior and posterior repair  . COLONOSCOPY  2011  . ESOPHAGOGASTRODUODENOSCOPY ENDOSCOPY    . HERNIA REPAIR  2014   hiatal  . LASIK  2008  . TONSILLECTOMY    . TUBAL LIGATION  1999   Social History   Socioeconomic History  . Marital status: Married    Spouse name: Not on file  . Number of children: Not on file  . Years of education: Not on file  . Highest education level: Not on file  Occupational History  . Not on file  Social Needs  . Financial resource strain: Not on file  . Food insecurity:    Worry: Not on file    Inability: Not on file  . Transportation needs:    Medical: Not on file    Non-medical: Not on file  Tobacco Use  . Smoking status: Former Smoker    Last attempt  to quit: 02/14/1998    Years since quitting: 21.0  . Smokeless tobacco: Never Used  Substance and Sexual Activity  . Alcohol use: No  . Drug use: No  . Sexual activity: Not on file  Lifestyle  . Physical activity:    Days per week: Not on file    Minutes per session: Not on file  . Stress: Not on file  Relationships  . Social connections:    Talks on phone: Not on file    Gets together: Not on file    Attends religious service: Not on file    Active member of club or organization: Not on file    Attends meetings of clubs or organizations: Not on file    Relationship status: Not on file  Other Topics Concern  . Not on file  Social History Narrative  . Not on file   No Known Allergies Family History  Problem Relation Age of Onset  . Cancer Mother 44       breast     Current Outpatient Medications (Endocrine & Metabolic):  .  levothyroxine (SYNTHROID, LEVOTHROID) 75 MCG tablet, Take 1 tablet (75 mcg total) by mouth daily.     Current Outpatient  Medications (Hematological):  Marland Kitchen  Cyanocobalamin (B-12 COMPLIANCE INJECTION) 1000 MCG/ML KIT, Inject 1,017mg once monthy IM  Current Outpatient Medications (Other):  .Marland Kitchen Vitamin D, Ergocalciferol, (DRISDOL) 1.25 MG (50000 UT) CAPS capsule, Take 1 capsule (50,000 Units total) by mouth every 7 (seven) days.    Past medical history, social, surgical and family history all reviewed in electronic medical record.  No pertanent information unless stated regarding to the chief complaint.   Review of Systems:  No headache, visual changes, nausea, vomiting, diarrhea, constipation, dizziness, abdominal pain, skin rash, fevers, chills, night sweats, weight loss, swollen lymph nodes, body aches, joint swelling, muscle aches, chest pain, shortness of breath, mood changes.   Objective  Blood pressure 102/64, pulse 88, height '5\' 2"'  (1.575 m), weight 195 lb (88.5 kg), SpO2 97 %.    General: No apparent distress alert and oriented x3 mood and  affect normal, dressed appropriately.  HEENT: Pupils equal, extraocular movements intact  Respiratory: Patient's speak in full sentences and does not appear short of breath  Cardiovascular: No lower extremity edema, non tender, no erythema  Skin: Warm dry intact with no signs of infection or rash on extremities or on axial skeleton.  Abdomen: Soft nontender  Neuro: Cranial nerves II through XII are intact, neurovascularly intact in all extremities with 2+ DTRs and 2+ pulses.  Lymph: No lymphadenopathy of posterior or anterior cervical chain or axillae bilaterally.  Gait normal with good balance and coordination.  MSK:  Non tender with full range of motion and good stability and symmetric strength and tone of shoulders, elbows,  hip, knee bilaterally.  Right ankle exam shows the patient does have tenderness to palpation of the peroneal tendons.  Patient does have some swelling in this area as well.  Difficulty with full dorsiflexion of the foot.  Patient has negative Tinel's test.  Good strength otherwise.  Neurovascular intact distally.  Contralateral ankle unremarkable  Right wrist exam shows no pain over the radial nerve on the pronation side.  Patient has full range of motion otherwise.  Negative Tinel sign.  Only Tinel sign over the radial nerve as stated above.  No weakness noted.  Limited musculoskeletal ultrasound was performed and interpreted by ZLyndal Pulley Limited ultrasound of patient's ankle show that patient did have peroneal tendinitis with chronic subluxation noted.  Patient had hypoechoic changes within the tendon sheath.  No increase in Doppler flow or any type of true tear. Impression: Peroneal tendinitis with chronic subluxation  [id number]; 15 additional minutes spent for Therapeutic exercises as stated in above notes.  This included exercises focusing on stretching, strengthening, with significant focus on eccentric aspects.   Long term goals include an improvement in range of  motion, strength, endurance as well as avoiding reinjury. Patient's frequency would include in 1-2 times a day, 3-5 times a week for a duration of 6-12 weeks. Ankle strengthening that included:  Basic range of motion exercises to allow proper full motion at ankle Stretching of the lower leg and hamstrings  Theraband exercises for the lower leg - inversion, eversion, dorsiflexion and plantarflexion each to be completed with a theraband Balance exercises to increase proprioception Weight bearing exercises to increase strength and balance   Proper technique shown and discussed handout in great detail with ATC.  All questions were discussed and answered.     Impression and Recommendations:     This case required medical decision making of moderate complexity. The above documentation has been reviewed and is accurate and  complete Lyndal Pulley, DO       Note: This dictation was prepared with Dragon dictation along with smaller phrase technology. Any transcriptional errors that result from this process are unintentional.

## 2019-03-03 ENCOUNTER — Other Ambulatory Visit: Payer: Self-pay

## 2019-03-03 ENCOUNTER — Ambulatory Visit
Admission: RE | Admit: 2019-03-03 | Discharge: 2019-03-03 | Disposition: A | Discharge status: Home / Self Care | Payer: Federal, State, Local not specified - PPO | Source: Ambulatory Visit | Attending: Family Medicine | Admitting: Family Medicine

## 2019-03-03 DIAGNOSIS — Z1231 Encounter for screening mammogram for malignant neoplasm of breast: Secondary | ICD-10-CM

## 2019-04-11 ENCOUNTER — Ambulatory Visit (INDEPENDENT_AMBULATORY_CARE_PROVIDER_SITE_OTHER): Payer: Federal, State, Local not specified - PPO | Admitting: Family Medicine

## 2019-04-11 ENCOUNTER — Encounter: Payer: Self-pay | Admitting: Family Medicine

## 2019-04-11 ENCOUNTER — Ambulatory Visit: Payer: Self-pay

## 2019-04-11 ENCOUNTER — Other Ambulatory Visit: Payer: Self-pay

## 2019-04-11 VITALS — BP 110/72 | HR 95 | Ht 62.0 in | Wt 193.0 lb

## 2019-04-11 DIAGNOSIS — G8929 Other chronic pain: Secondary | ICD-10-CM

## 2019-04-11 DIAGNOSIS — M25571 Pain in right ankle and joints of right foot: Secondary | ICD-10-CM | POA: Diagnosis not present

## 2019-04-11 DIAGNOSIS — M7671 Peroneal tendinitis, right leg: Secondary | ICD-10-CM

## 2019-04-11 NOTE — Patient Instructions (Signed)
Let it rest while camping Start exercises when you get back home See me again in 4 weeks

## 2019-04-11 NOTE — Progress Notes (Signed)
[facility] [address], [place] [address] Phone: [phone] Subjective:     CC: Right foot and heel pain  [id number]   02/28/2019: Peroneal tendinitis.  Given home exercises, discussed compression, discussed topical anti-inflammatories and icing regimen.  Discussed proper shoes.  Follow-up again in 4 to 6 weeks  Update 04/11/2019: Sabrina Mcdaniel is a 53 y.o. female coming in with complaint of ankle pain. No change since last visit. Has been wearing compression sleeve for ankle. Still having pain with weight bearing. Pain increases with wearing tennis shoes. Wonders if shoe is pressing on peroneal tendon.  Patient states that she can have good days and sometimes bad days.  Patient states symptoms the swelling was so bad that she could not get into a shoe.  Does seem to get better when she does certain other things like staying off of the foot.         Past Medical History:  Diagnosis Date  . Anemia    Iron deficiency  . B12 deficiency   . Cholelithiasis   . Depression   . GERD (gastroesophageal reflux disease)   . Migraines   . Osteopenia    Past Surgical History:  Procedure Laterality Date  . ABDOMINAL HYSTERECTOMY  2009   anterior and posterior repair  . COLONOSCOPY  2011  . ESOPHAGOGASTRODUODENOSCOPY ENDOSCOPY    . HERNIA REPAIR  2014   hiatal  . LASIK  2008  . TONSILLECTOMY    . TUBAL LIGATION  1999   Social History   Socioeconomic History  . Marital status: Married    Spouse name: Not on file  . Number of children: Not on file  . Years of education: Not on file  . Highest education level: Not on file  Occupational History  . Not on file  Social Needs  . Financial resource strain: Not on file  . Food insecurity    Worry: Not on file    Inability: Not on file  . Transportation needs    Medical: Not on file    Non-medical: Not on file  Tobacco Use  . Smoking status: Former Smoker    Quit date: 02/14/1998   Years since quitting: 21.1  . Smokeless tobacco: Never Used  Substance and Sexual Activity  . Alcohol use: No  . Drug use: No  . Sexual activity: Not on file  Lifestyle  . Physical activity    Days per week: Not on file    Minutes per session: Not on file  . Stress: Not on file  Relationships  . Social Herbalist on phone: Not on file    Gets together: Not on file    Attends religious service: Not on file    Active member of club or organization: Not on file    Attends meetings of clubs or organizations: Not on file    Relationship status: Not on file  Other Topics Concern  . Not on file  Social History Narrative  . Not on file   No Known Allergies Family History  Problem Relation Age of Onset  . Cancer Mother 11       breast     Current Outpatient Medications (Endocrine & Metabolic):  .  levothyroxine (SYNTHROID, LEVOTHROID) 75 MCG tablet, Take 1 tablet (75 mcg total) by mouth daily.     Current Outpatient Medications (Hematological):  Marland Kitchen  Cyanocobalamin (B-12 COMPLIANCE INJECTION) 1000 MCG/ML KIT, Inject 1,079mg once monthy IM  Past medical history, social, surgical and family history all reviewed in electronic medical record.  No pertanent information unless stated regarding to the chief complaint.   Review of Systems:  No headache, visual changes, nausea, vomiting, diarrhea, constipation, dizziness, abdominal pain, skin rash, fevers, chills, night sweats, weight loss, swollen lymph nodes, body aches, joint swelling,  chest pain, shortness of breath, mood changes.  Positive muscle aches  Objective  Blood pressure 110/72, pulse 95, height '5\' 2"'  (1.575 m), weight 193 lb (87.5 kg), SpO2 96 %.    General: No apparent distress alert and oriented x3 mood and affect normal, dressed appropriately.  HEENT: Pupils equal, extraocular movements intact  Respiratory: Patient's speak in full sentences and does not appear short of breath  Cardiovascular: No  lower extremity edema, non tender, no erythema  Skin: Warm dry intact with no signs of infection or rash on extremities or on axial skeleton.  Abdomen: Soft nontender  Neuro: Cranial nerves II through XII are intact, neurovascularly intact in all extremities with 2+ DTRs and 2+ pulses.  Lymph: No lymphadenopathy of posterior or anterior cervical chain or axillae bilaterally.  Gait normal with good balance and coordination.  MSK:  tender with full range of motion and good stability and symmetric strength and tone of shoulders, elbows, wrist, hip, knee bilaterally.  Ankle: right  Swelling noted over the peroneal cyst Range of motion is full in all directions. Strength is 5/5 in all directions. Stable lateral and medial ligaments; squeeze test and kleiger test unremarkable; Talar dome minimally tender ; No pain at base of 5th MT; No tenderness over cuboid; No tenderness over N spot or navicular prominence Negative tarsal tunnel tinel's Able to walk 4 steps. Contralateral ankle mild swelling  Procedure: Real-time Ultrasound Guided Injection of right peroneal tendonitis.  Device: GE Logiq Q7 Ultrasound guided injection is preferred based studies that show increased duration, increased effect, greater accuracy, decreased procedural pain, increased response rate, and decreased cost with ultrasound guided versus blind injection.  Verbal informed consent obtained.  Time-out conducted.  Noted no overlying erythema, induration, or other signs of local infection.  Skin prepped in a sterile fashion.  Local anesthesia: Topical Ethyl chloride.  With sterile technique and under real time ultrasound guidance: 25-gauge half inch needle injected with 5 cc of 0.5% Marcaine and 0.5 cc of Kenalog 40 mg/mL Completed without difficulty  Pain immediately resolved suggesting accurate placement of the medication.  Advised to call if fevers/chills, erythema, induration, drainage, or persistent bleeding.  Images  permanently stored and available for review in the ultrasound unit.  Impression: Technically successful ultrasound guided injection.       Impression and Recommendations:     This case required medical decision making of moderate complexity. The above documentation has been reviewed and is accurate and complete Lyndal Pulley, DO       Note: This dictation was prepared with Dragon dictation along with smaller phrase technology. Any transcriptional errors that result from this process are unintentional.

## 2019-04-11 NOTE — Assessment & Plan Note (Signed)
Peroneal tedndon injected today  Ice  Brace Heel lift in shoe  RTC in 4 weeks

## 2019-04-12 ENCOUNTER — Encounter: Payer: Self-pay | Admitting: Family Medicine

## 2019-05-09 ENCOUNTER — Other Ambulatory Visit: Payer: Self-pay | Admitting: Family Medicine

## 2019-05-09 ENCOUNTER — Ambulatory Visit (INDEPENDENT_AMBULATORY_CARE_PROVIDER_SITE_OTHER): Payer: Federal, State, Local not specified - PPO | Admitting: Family Medicine

## 2019-05-09 ENCOUNTER — Other Ambulatory Visit: Payer: Self-pay

## 2019-05-09 ENCOUNTER — Encounter: Payer: Self-pay | Admitting: Family Medicine

## 2019-05-09 DIAGNOSIS — K5909 Other constipation: Secondary | ICD-10-CM

## 2019-05-09 DIAGNOSIS — M79671 Pain in right foot: Secondary | ICD-10-CM | POA: Diagnosis not present

## 2019-05-09 DIAGNOSIS — E039 Hypothyroidism, unspecified: Secondary | ICD-10-CM

## 2019-05-09 MED ORDER — VITAMIN D (ERGOCALCIFEROL) 1.25 MG (50000 UNIT) PO CAPS: 50000.0000 [IU] | ORAL_CAPSULE | ORAL | 0 refills | Status: DC

## 2019-05-09 NOTE — Assessment & Plan Note (Signed)
Right heel pain.  Has had difficulty with him previously, vitamin D deficiency likely is playing a role.  Patient will be started on vitamin D" discussed with patient.  We discussed leaving CAM Walker again for the next 2 weeks, we discussed that he will need in case this is more of fat pad irritation.  Discussed that advanced imaging would be warranted as well.  Patient will continue with conservative therapy otherwise at the moment and follow-up with me again in 3 to 4 weeks

## 2019-05-09 NOTE — Patient Instructions (Addendum)
Good to see you Cam walker daily for the next 2 weeks Once weekly vitamin D Heel cup See me again in 3-4 weeks

## 2019-05-09 NOTE — Progress Notes (Signed)
[facility] [place] [address] [place], [place] [address] Phone: [phone] Subjective:   I [name] am serving as a Education administrator for Dr. Hulan Saas.  I'm seeing this patient by the request  of:    CC: Right heel pain  [id number]   04/11/2019 Peroneal tedndon injected today  Ice  Brace Heel lift in shoe  RTC in 4 weeks   05/09/2019 Sabrina Mcdaniel is a 53 y.o. female coming in with complaint of right ankle pain. Most of her pain is in the bottom of her foot. She states she has not noticed any improvement.  Patient states that the injection of the ankle may have helped but unfortunately continues to have the heel pain.  Seems to be better and in the morning and then worse throughout the day.      Past Medical History:  Diagnosis Date  . Anemia    Iron deficiency  . B12 deficiency   . Cholelithiasis   . Depression   . GERD (gastroesophageal reflux disease)   . Migraines   . Osteopenia    Past Surgical History:  Procedure Laterality Date  . ABDOMINAL HYSTERECTOMY  2009   anterior and posterior repair  . COLONOSCOPY  2011  . ESOPHAGOGASTRODUODENOSCOPY ENDOSCOPY    . HERNIA REPAIR  2014   hiatal  . LASIK  2008  . TONSILLECTOMY    . TUBAL LIGATION  1999   Social History   Socioeconomic History  . Marital status: Married    Spouse name: Not on file  . Number of children: Not on file  . Years of education: Not on file  . Highest education level: Not on file  Occupational History  . Not on file  Social Needs  . Financial resource strain: Not on file  . Food insecurity    Worry: Not on file    Inability: Not on file  . Transportation needs    Medical: Not on file    Non-medical: Not on file  Tobacco Use  . Smoking status: Former Smoker    Quit date: 02/14/1998    Years since quitting: 21.2  . Smokeless tobacco: Never Used  Substance and Sexual Activity  . Alcohol use: No  . Drug use: No  . Sexual activity: Not on file   Lifestyle  . Physical activity    Days per week: Not on file    Minutes per session: Not on file  . Stress: Not on file  Relationships  . Social Herbalist on phone: Not on file    Gets together: Not on file    Attends religious service: Not on file    Active member of club or organization: Not on file    Attends meetings of clubs or organizations: Not on file    Relationship status: Not on file  Other Topics Concern  . Not on file  Social History Narrative  . Not on file   No Known Allergies Family History  Problem Relation Age of Onset  . Cancer Mother 22       breast     Current Outpatient Medications (Endocrine & Metabolic):  .  levothyroxine (SYNTHROID, LEVOTHROID) 75 MCG tablet, Take 1 tablet (75 mcg total) by mouth daily.     Current Outpatient Medications (Hematological):  Marland Kitchen  Cyanocobalamin (B-12 COMPLIANCE INJECTION) 1000 MCG/ML KIT, Inject 1,029mg once monthy IM  Current Outpatient Medications (Other):  .Marland Kitchen Vitamin D, Ergocalciferol, (DRISDOL) 1.25 MG (  50000 UT) CAPS capsule, Take 1 capsule (50,000 Units total) by mouth every 7 (seven) days.    Past medical history, social, surgical and family history all reviewed in electronic medical record.  No pertanent information unless stated regarding to the chief complaint.   Review of Systems:  No headache, visual changes, nausea, vomiting, diarrhea, constipation, dizziness, abdominal pain, skin rash, fevers, chills, night sweats, weight loss, swollen lymph nodes, body aches, joint swelling, muscle aches, chest pain, shortness of breath, mood changes.   Objective  Blood pressure 110/70, pulse 68, height '5\' 2"'  (1.575 m), weight 191 lb (86.6 kg), SpO2 97 %. Systems examined below as of    General: No apparent distress alert and oriented x3 mood and affect normal, dressed appropriately.  HEENT: Pupils equal, extraocular movements intact  Respiratory: Patient's speak in full sentences and does not appear  short of breath  Cardiovascular: No lower extremity edema, non tender, no erythema  Skin: Warm dry intact with no signs of infection or rash on extremities or on axial skeleton.  Abdomen: Soft nontender  Neuro: Cranial nerves II through XII are intact, neurovascularly intact in all extremities with 2+ DTRs and 2+ pulses.  Lymph: No lymphadenopathy of posterior or anterior cervical chain or axillae bilaterally.  Gait normal with good balance and coordination.  MSK:  Non tender with full range of motion and good stability and symmetric strength and tone of shoulders, elbows, wrist, hip, knee bilaterally.    Right ankle exam shows the patient does have improvement from swelling on the lateral aspect of the ankle.  Patient noticed tender more on the lateral heel.  Seems to be just on the lateral plantar surface.  Patient relates more pain with weightbearing than anything else.  Minimal pain on palpation. Impression and Recommendations:     This case required medical decision making of moderate complexity. The above documentation has been reviewed and is accurate and complete Lyndal Pulley, DO       Note: This dictation was prepared with Dragon dictation along with smaller phrase technology. Any transcriptional errors that result from this process are unintentional.

## 2019-05-10 ENCOUNTER — Encounter: Payer: Self-pay | Admitting: Family Medicine

## 2019-05-10 NOTE — Assessment & Plan Note (Signed)
Improved after injection.  Now more lateral heel pain.

## 2019-05-11 ENCOUNTER — Other Ambulatory Visit: Payer: Self-pay

## 2019-05-11 DIAGNOSIS — E039 Hypothyroidism, unspecified: Secondary | ICD-10-CM

## 2019-05-11 MED ORDER — LEVOTHYROXINE SODIUM 75 MCG PO TABS: ORAL_TABLET | ORAL | 0 refills | Status: DC

## 2019-05-11 MED ORDER — CYANOCOBALAMIN 1000 MCG/ML IJ SOLN: INTRAMUSCULAR | 0 refills | Status: DC

## 2019-05-21 ENCOUNTER — Encounter: Payer: Self-pay | Admitting: Family Medicine

## 2019-05-22 ENCOUNTER — Other Ambulatory Visit: Payer: Self-pay

## 2019-05-22 DIAGNOSIS — M7671 Peroneal tendinitis, right leg: Secondary | ICD-10-CM

## 2019-05-22 NOTE — Progress Notes (Unsigned)
MRI

## 2019-06-10 ENCOUNTER — Other Ambulatory Visit: Payer: Self-pay

## 2019-06-10 ENCOUNTER — Ambulatory Visit
Admission: RE | Admit: 2019-06-10 | Discharge: 2019-06-10 | Disposition: A | Discharge status: Home / Self Care | Payer: Federal, State, Local not specified - PPO | Source: Ambulatory Visit | Attending: Family Medicine | Admitting: Family Medicine

## 2019-06-10 ENCOUNTER — Encounter: Payer: Self-pay | Admitting: Family Medicine

## 2019-06-10 DIAGNOSIS — M7671 Peroneal tendinitis, right leg: Secondary | ICD-10-CM

## 2019-06-10 DIAGNOSIS — M722 Plantar fascial fibromatosis: Secondary | ICD-10-CM | POA: Diagnosis not present

## 2019-06-12 ENCOUNTER — Other Ambulatory Visit: Payer: Federal, State, Local not specified - PPO

## 2019-06-13 ENCOUNTER — Ambulatory Visit: Payer: Self-pay

## 2019-06-13 ENCOUNTER — Encounter: Payer: Self-pay | Admitting: Family Medicine

## 2019-06-13 ENCOUNTER — Other Ambulatory Visit: Payer: Self-pay

## 2019-06-13 ENCOUNTER — Ambulatory Visit (INDEPENDENT_AMBULATORY_CARE_PROVIDER_SITE_OTHER): Payer: Federal, State, Local not specified - PPO | Admitting: Family Medicine

## 2019-06-13 VITALS — BP 110/70 | HR 68 | Ht 62.0 in | Wt 188.4 lb

## 2019-06-13 DIAGNOSIS — G8929 Other chronic pain: Secondary | ICD-10-CM | POA: Diagnosis not present

## 2019-06-13 DIAGNOSIS — M79671 Pain in right foot: Secondary | ICD-10-CM

## 2019-06-13 MED ORDER — PREDNISONE 50 MG PO TABS: ORAL_TABLET | ORAL | 0 refills | Status: DC

## 2019-06-13 MED ORDER — GABAPENTIN 100 MG PO CAPS: 200.0000 mg | ORAL_CAPSULE | Freq: Every day | ORAL | 3 refills | Status: DC

## 2019-06-13 NOTE — Progress Notes (Signed)
[facility] [place], [place] [address] Phone: [phone] Subjective:   I [name] am serving as a Education administrator for Dr. Hulan Saas.  I'm seeing this patient by the request  of:    CC: Heel pain follow-up  [id number]   05/09/2019 Right heel pain.  Has had difficulty with him previously, vitamin D deficiency likely is playing a role.  Patient will be started on vitamin D" discussed with patient.  We discussed leaving CAM Walker again for the next 2 weeks, we discussed that he will need in case this is more of fat pad irritation.  Discussed that advanced imaging would be warranted as well.  Patient will continue with conservative therapy otherwise at the moment and follow-up with me again in 3 to 4 weeks  06/13/2019 Rejeanne Madey Arnaud is a 53 y.o. female coming in with complaint of right heel pain. States the heel has not made any progress.  Patient had failed all conservative therapy.  Sent for an MRI.  MRI showed the patient did have a mild plantar fasciitis of the medial cord but otherwise fairly unremarkable.  Patient does state since the last injection of the peroneal cyst though the swelling has been down but the pain is no better.  Even light sensation to the lateral aspect of the heel can be severe.  States that it seems to do better when she is not in shoes and even bending though still gives some discomfort.     Past Medical History:  Diagnosis Date  . Anemia    Iron deficiency  . B12 deficiency   . Cholelithiasis   . Depression   . GERD (gastroesophageal reflux disease)   . Migraines   . Osteopenia    Past Surgical History:  Procedure Laterality Date  . ABDOMINAL HYSTERECTOMY  2009   anterior and posterior repair  . COLONOSCOPY  2011  . ESOPHAGOGASTRODUODENOSCOPY ENDOSCOPY    . HERNIA REPAIR  2014   hiatal  . LASIK  2008  . TONSILLECTOMY    . TUBAL LIGATION  1999   Social History   Socioeconomic History  . Marital  status: Married    Spouse name: Not on file  . Number of children: Not on file  . Years of education: Not on file  . Highest education level: Not on file  Occupational History  . Not on file  Social Needs  . Financial resource strain: Not on file  . Food insecurity    Worry: Not on file    Inability: Not on file  . Transportation needs    Medical: Not on file    Non-medical: Not on file  Tobacco Use  . Smoking status: Former Smoker    Quit date: 02/14/1998    Years since quitting: 21.3  . Smokeless tobacco: Never Used  Substance and Sexual Activity  . Alcohol use: No  . Drug use: No  . Sexual activity: Not on file  Lifestyle  . Physical activity    Days per week: Not on file    Minutes per session: Not on file  . Stress: Not on file  Relationships  . Social Herbalist on phone: Not on file    Gets together: Not on file    Attends religious service: Not on file    Active member of club or organization: Not on file    Attends meetings of clubs or organizations: Not on file  Relationship status: Not on file  Other Topics Concern  . Not on file  Social History Narrative  . Not on file   No Known Allergies Family History  Problem Relation Age of Onset  . Cancer Mother 30       breast     Current Outpatient Medications (Endocrine & Metabolic):  .  levothyroxine (SYNTHROID) 75 MCG tablet, Take 1qd (plz sched Physical in Nov) .  predniSONE (DELTASONE) 50 MG tablet, 1 Tablet a day for 5 days     Current Outpatient Medications (Hematological):  .  cyanocobalamin (,VITAMIN B-12,) 1000 MCG/ML injection, INJECT 1,000 MCG INTRAMUSCULARY ONCE MONTHLY  Current Outpatient Medications (Other):  Marland Kitchen  Vitamin D, Ergocalciferol, (DRISDOL) 1.25 MG (50000 UT) CAPS capsule, Take 1 capsule (50,000 Units total) by mouth every 7 (seven) days. Marland Kitchen  gabapentin (NEURONTIN) 100 MG capsule, Take 2 capsules (200 mg total) by mouth at bedtime.    Past medical history, social,  surgical and family history all reviewed in electronic medical record.  No pertanent information unless stated regarding to the chief complaint.   Review of Systems:  No headache, visual changes, nausea, vomiting, diarrhea, constipation, dizziness, abdominal pain, skin rash, fevers, chills, night sweats, weight loss, swollen lymph nodes, body aches, joint swelling, muscle aches, chest pain, shortness of breath, mood changes.   Objective  Blood pressure 110/70, pulse 68, height 5\' 2"  (1.575 m), weight 188 lb 6.4 oz (85.5 kg), SpO2 97 %. Systems examined below as of    General: No apparent distress alert and oriented x3 mood and affect normal, dressed appropriately.  HEENT: Pupils equal, extraocular movements intact  Respiratory: Patient's speak in full sentences and does not appear short of breath  Cardiovascular: No lower extremity edema, non tender, no erythema  Skin: Warm dry intact with no signs of infection or rash on extremities or on axial skeleton.  Abdomen: Soft nontender  Neuro: Cranial nerves II through XII are intact, neurovascularly intact in all extremities with 2+ DTRs and 2+ pulses.  Lymph: No lymphadenopathy of posterior or anterior cervical chain or axillae bilaterally.  Gait normal with good balance and coordination.  MSK:  Non tender with full range of motion and good stability and symmetric strength and tone of shoulders, elbows, wrist, hip, knee bilaterally.  Right ankle shows less swelling than previous exam over the lateral aspect.  Patient still has pain that is out of proportion to the amount of palpation over the lateral aspect even to light sensation now which is seems to be different and worsening.  Mild increase in tightness of the posterior tibialis tendon on the medial aspect of the ankle.  Minimal tenderness with palpation over the plantar fascia. Contralateral ankle unremarkable  Limited musculoskeletal ultrasound was performed and interpreted by Lyndal Pulley   Patient's previous peroneal subluxation seems to have resolved.  Peroneal cyst is still noted but significant decrease in size.  Where patient has the most pain no significant irritation of the plantar nerve noted.    Impression and Recommendations:     This case required medical decision making of moderate complexity. The above documentation has been reviewed and is accurate and complete Lyndal Pulley, DO       Note: This dictation was prepared with Dragon dictation along with smaller phrase technology. Any transcriptional errors that result from this process are unintentional.

## 2019-06-13 NOTE — Patient Instructions (Addendum)
Pad to the heel daily Lotion to the heel  Push off with large toe Gabapentin 200 mg at night Send me a message in 2 weeks and tell me how you feel. Follow up with me in 4 weeks

## 2019-06-14 ENCOUNTER — Encounter: Payer: Self-pay | Admitting: Family Medicine

## 2019-06-14 NOTE — Assessment & Plan Note (Signed)
Patient continues to have intractable heel pain.  Seems to be more of a light sensation now at this time and could be more of a nerve irritation, some of the swelling differential includes a reflex sympathetic dystrophy.  I believe though that it is more secondary to tactile at the moment.  Encourage patient to use barrier creams and we will do more stretching of the posterior tibialis to try to illuminate the amount of pressure on the lateral aspect of the heel.  Do not feel any type of injections would be beneficial.  Discussed other medications including anti-inflammatories or gabapentin.  Differential also includes possible lumbar radiculopathy and we will discuss if necessary.  5-day burst of prednisone given.  Follow-up again in 4 weeks.  Spent  25 minutes with patient face-to-face and had greater than 50% of counseling including as described above in assessment and plan.

## 2019-06-23 ENCOUNTER — Encounter: Payer: Self-pay | Admitting: Family Medicine

## 2019-06-26 ENCOUNTER — Other Ambulatory Visit: Payer: Self-pay

## 2019-06-26 ENCOUNTER — Telehealth: Payer: Self-pay

## 2019-06-26 DIAGNOSIS — E039 Hypothyroidism, unspecified: Secondary | ICD-10-CM

## 2019-06-26 NOTE — Telephone Encounter (Signed)

## 2019-06-27 ENCOUNTER — Other Ambulatory Visit: Payer: Self-pay

## 2019-06-27 ENCOUNTER — Other Ambulatory Visit (INDEPENDENT_AMBULATORY_CARE_PROVIDER_SITE_OTHER): Payer: Federal, State, Local not specified - PPO

## 2019-06-27 DIAGNOSIS — E039 Hypothyroidism, unspecified: Secondary | ICD-10-CM | POA: Diagnosis not present

## 2019-06-28 LAB — TSH: TSH: 1.52 u[IU]/mL (ref 0.450–4.500)

## 2019-06-28 LAB — T3: T3, Total: 99 ng/dL (ref 71–180)

## 2019-06-28 LAB — SPECIMEN STATUS REPORT

## 2019-06-28 LAB — T4, FREE: Free T4: 1.47 ng/dL (ref 0.82–1.77)

## 2019-07-03 ENCOUNTER — Encounter: Payer: Self-pay | Admitting: Family Medicine

## 2019-07-18 ENCOUNTER — Ambulatory Visit (INDEPENDENT_AMBULATORY_CARE_PROVIDER_SITE_OTHER): Payer: Federal, State, Local not specified - PPO | Admitting: Family Medicine

## 2019-07-18 ENCOUNTER — Other Ambulatory Visit: Payer: Self-pay

## 2019-07-18 ENCOUNTER — Encounter: Payer: Self-pay | Admitting: Neurology

## 2019-07-18 ENCOUNTER — Encounter: Payer: Self-pay | Admitting: Family Medicine

## 2019-07-18 ENCOUNTER — Other Ambulatory Visit: Payer: Self-pay | Admitting: Family Medicine

## 2019-07-18 VITALS — BP 104/70 | HR 78 | Ht 62.0 in

## 2019-07-18 DIAGNOSIS — M79671 Pain in right foot: Secondary | ICD-10-CM

## 2019-07-18 NOTE — Progress Notes (Signed)
[facility] [place], [place] [address] Phone: [phone] Subjective:    I'm seeing this patient by the request  of:    CC: Right foot pain and heel pain follow-up  [id number]   06/13/2019 Patient continues to have intractable heel pain.  Seems to be more of a light sensation now at this time and could be more of a nerve irritation, some of the swelling differential includes a reflex sympathetic dystrophy.  I believe though that it is more secondary to tactile at the moment.  Encourage patient to use barrier creams and we will do more stretching of the posterior tibialis to try to illuminate the amount of pressure on the lateral aspect of the heel.  Do not feel any type of injections would be beneficial.  Discussed other medications including anti-inflammatories or gabapentin.  Differential also includes possible lumbar radiculopathy and we will discuss if necessary.  5-day burst of prednisone given.  Follow-up again in 4 weeks.  Spent  25 minutes with patient face-to-face and had greater than 50% of counseling including as described above in assessment and plan.  Update 07/18/2019 Sabrina Mcdaniel is a 53 y.o. female coming in with complaint of right heel pain. States that she can be at a 2/10 pain and then at a 8/10 pain. States that she has sharp, tearing pain with inversion and PF. Pain regards of shoe type. Prednisone for 5 days did alleviate her pain but it came back once she was off of it.  Patient still states that anytime she does do the anti-inflammatories it helps a little bit as well.  Patient is concerned though because she is never without pain.    Past Medical History:  Diagnosis Date  . Anemia    Iron deficiency  . B12 deficiency   . Cholelithiasis   . Depression   . GERD (gastroesophageal reflux disease)   . Migraines   . Osteopenia    Past Surgical History:  Procedure Laterality Date  . ABDOMINAL HYSTERECTOMY  2009   anterior and posterior repair  . COLONOSCOPY  2011  . ESOPHAGOGASTRODUODENOSCOPY ENDOSCOPY    . HERNIA REPAIR  2014   hiatal  . LASIK  2008  . TONSILLECTOMY    . TUBAL LIGATION  1999   Social History   Socioeconomic History  . Marital status: Married    Spouse name: Not on file  . Number of children: Not on file  . Years of education: Not on file  . Highest education level: Not on file  Occupational History  . Not on file  Social Needs  . Financial resource strain: Not on file  . Food insecurity    Worry: Not on file    Inability: Not on file  . Transportation needs    Medical: Not on file    Non-medical: Not on file  Tobacco Use  . Smoking status: Former Smoker    Quit date: 02/14/1998    Years since quitting: 21.4  . Smokeless tobacco: Never Used  Substance and Sexual Activity  . Alcohol use: No  . Drug use: No  . Sexual activity: Not on file  Lifestyle  . Physical activity    Days per week: Not on file    Minutes per session: Not on file  . Stress: Not on file  Relationships  . Social Herbalist on phone: Not on file    Gets together: Not on file  Attends religious service: Not on file    Active member of club or organization: Not on file    Attends meetings of clubs or organizations: Not on file    Relationship status: Not on file  Other Topics Concern  . Not on file  Social History Narrative  . Not on file   No Known Allergies Family History  Problem Relation Age of Onset  . Cancer Mother 57       breast     Current Outpatient Medications (Endocrine & Metabolic):  .  levothyroxine (SYNTHROID) 75 MCG tablet, Take 1qd (plz sched Physical in Nov) .  predniSONE (DELTASONE) 50 MG tablet, 1 Tablet a day for 5 days     Current Outpatient Medications (Hematological):  .  cyanocobalamin (,VITAMIN B-12,) 1000 MCG/ML injection, INJECT 1,000 MCG INTRAMUSCULARY ONCE MONTHLY  Current Outpatient Medications (Other):  .  gabapentin (NEURONTIN)  100 MG capsule, Take 2 capsules (200 mg total) by mouth at bedtime. .  Vitamin D, Ergocalciferol, (DRISDOL) 1.25 MG (50000 UT) CAPS capsule, Take 1 capsule (50,000 Units total) by mouth every 7 (seven) days.    Past medical history, social, surgical and family history all reviewed in electronic medical record.  No pertanent information unless stated regarding to the chief complaint.   Review of Systems:  No headache, visual changes, nausea, vomiting, diarrhea, constipation, dizziness, abdominal pain, skin rash, fevers, chills, night sweats, weight loss, swollen lymph nodes, body aches, joint swelling, muscle aches, chest pain, shortness of breath, mood changes.   Objective  Blood pressure 104/70, pulse 78, height 5\' 2"  (1.575 m), SpO2 97 %.    General: No apparent distress alert and oriented x3 mood and affect normal, dressed appropriately.  HEENT: Pupils equal, extraocular movements intact  Respiratory: Patient's speak in full sentences and does not appear short of breath  Cardiovascular: No lower extremity edema, non tender, no erythema  Skin: Warm dry intact with no signs of infection or rash on extremities or on axial skeleton.  Abdomen: Soft nontender  Neuro: Cranial nerves II through XII are intact, neurovascularly intact in all extremities with 2+ DTRs and 2+ pulses.  Lymph: No lymphadenopathy of posterior or anterior cervical chain or axillae bilaterally.  Gait mild antalgic MSK:  Non tender with full range of motion and good stability and symmetric strength and tone of shoulders, elbows, wrist, hip, knee bilaterally.  Right ankle exam has no significant swelling, swelling around the peroneal tendons is even improved.  Still having pain to even light palpation over the lateral heel area.  Achilles intact with no discomfort.  Neurovascularly intact distally.     Impression and Recommendations:     This case required medical decision making of moderate complexity. The above  documentation has been reviewed and is accurate and complete Lyndal Pulley, DO       Note: This dictation was prepared with Dragon dictation along with smaller phrase technology. Any transcriptional errors that result from this process are unintentional.

## 2019-07-18 NOTE — Patient Instructions (Signed)
Nerve conduction RLE Will be in touch

## 2019-07-18 NOTE — Assessment & Plan Note (Addendum)
At this juncture did not have any good diagnosis at the moment.  Seems to have more of a sural nerve irritation with the lateral plantar nerve injury potentially.  At this time I do feel that a nerve conduction study would be helpful to see if there is any injury or impingement on the sural nerve that could be contributing either in the lateral gastroc area, fibular head, or more proximally even the lumbar spine.  Patient is in agreement the plan.  Discussed which activities to do which wants to avoid.  Patient will increase activity slowly.  Follow-up again in 4 to 8 weeks

## 2019-07-20 ENCOUNTER — Other Ambulatory Visit: Payer: Self-pay

## 2019-07-20 DIAGNOSIS — R202 Paresthesia of skin: Secondary | ICD-10-CM

## 2019-08-01 ENCOUNTER — Other Ambulatory Visit: Payer: Self-pay

## 2019-08-01 ENCOUNTER — Ambulatory Visit (INDEPENDENT_AMBULATORY_CARE_PROVIDER_SITE_OTHER): Payer: Federal, State, Local not specified - PPO | Admitting: Neurology

## 2019-08-01 DIAGNOSIS — R202 Paresthesia of skin: Secondary | ICD-10-CM

## 2019-08-01 NOTE — Procedures (Signed)
[facility]  [address]  [place], [place] [address] Tel: [phone] Fax:  [phone] Test Date:  08/01/2019  Patient: Sabrina Mcdaniel DOB: 29-Nov-1965 Physician: Narda Amber, DO  Sex: Female Height: 5\' 2"  Ref Phys: Hulan Saas, DO  ID#: [id number] Temp: 36.0C Technician:    Patient Complaints: This is a 53 year old female referred for evaluation of right heel pain.  NCV & EMG Findings: Extensive electrodiagnostic testing of the right lower extremity shows:  1. Right sural and superficial peroneal sensory responses are within normal limits. 2. Right peroneal and tibial motor responses are within normal limits. 3. Right tibial H reflex study is within normal limits. 4. There is no evidence of active or chronic motor axonal loss changes affecting any of the tested muscles.  Motor unit configuration and recruitment pattern is within normal limits.  Impression: This is a normal study of the right lower extremity.  In particular, there is no evidence of a sural mononeuropathy, sensorimotor polyneuropathy, or lumbosacral radiculopathy.   ___________________________ Narda Amber, DO    Nerve Conduction Studies Anti Sensory Summary Table   Site NR Peak (ms) Norm Peak (ms) P-T Amp (V) Norm P-T Amp  Right Sup Peroneal Anti Sensory (Ant Lat Mall)  36C  12 cm    2.3 <4.6 9.2 >4  Right Sural Anti Sensory (Lat Mall)  36C  Calf    2.6 <4.6 19.3 >4   Motor Summary Table   Site NR Onset (ms) Norm Onset (ms) O-P Amp (mV) Norm O-P Amp Site1 Site2 Delta-0 (ms) Dist (cm) Vel (m/s) Norm Vel (m/s)  Right Peroneal Motor (Ext Dig Brev)  36C  Ankle    2.6 <6.0 5.2 >2.5 B Fib Ankle 6.9 38.0 55 >40  B Fib    9.5  5.1  Poplt B Fib 1.4 8.0 57 >40  Poplt    10.9  5.0         Right Tibial Motor (Abd Hall Brev)  36C  Ankle    2.6 <6.0 12.0 >4 Knee Ankle 7.3 39.0 53 >40  Knee    9.9  9.1          H Reflex Studies   NR H-Lat (ms) Lat Norm (ms) L-R H-Lat (ms)  Right  Tibial (Gastroc)  36C     30.75 <35    EMG   Side Muscle Ins Act Fibs Psw Fasc Number Recrt Dur Dur. Amp Amp. Poly Poly. Comment  Right AntTibialis Nml Nml Nml Nml Nml Nml Nml Nml Nml Nml Nml Nml N/A  Right Gastroc Nml Nml Nml Nml Nml Nml Nml Nml Nml Nml Nml Nml N/A  Right Flex Dig Long Nml Nml Nml Nml Nml Nml Nml Nml Nml Nml Nml Nml N/A  Right RectFemoris Nml Nml Nml Nml Nml Nml Nml Nml Nml Nml Nml Nml N/A  Right GluteusMed Nml Nml Nml Nml Nml Nml Nml Nml Nml Nml Nml Nml N/A  Right BicepsFemS Nml Nml Nml Nml Nml Nml Nml Nml Nml Nml Nml Nml N/A      Waveforms:

## 2019-08-04 NOTE — Assessment & Plan Note (Addendum)
clinically euthyroid, labs UTD> Continue current dose of synthroid until we have those results.

## 2019-08-04 NOTE — Progress Notes (Signed)
Subjective:   Patient ID: Sabrina Mcdaniel, female    DOB: [birth year], 53 y.o.   MRN: [id number]  Sabrina Mcdaniel is a pleasant 53 y.o. year old female who presents to clinic today with Annual Exam (Pt is here today for a CPE without PAP. She is due for Colonoscopy or cologuard on 5.4.21. She has had her Shingles series but never takes a flu shot but it will be offered today. ), Follow-up (hypothyroidism, heel pain, anemia, vitamin D deficiency), and Recurrent Skin Infections (Pt c/o boil on her back near spine that she would like Dr. Deborra Medina to look at x11 days)  on 08/06/2019  HPI:  Health Maintenance  Topic Date Due  . HIV Screening  11/28/1979  . COLONOSCOPY  01/22/2020  . MAMMOGRAM  03/02/2021  . INFLUENZA VACCINE  Discontinued  . TETANUS/TDAP  Discontinued  Feels she is holding up okay.  She has been working from home for years.  Remote h/o hysterectomy. Mammogram 03/03/19.  Has never had a colonoscopy for screening- had one in 2011 for GI issues.  Hypothyroidism- clinically euthyroid on current dose of synthroid- 75 mcg daily. Denies any symptoms of hypo or hyperthyroidism.   Lump on her back- has been there for 7 days, growing more tender. No drainage.   Depression screen [facility] 2/9 08/06/2019 07/31/2018  Decreased Interest 0 0  Down, Depressed, Hopeless 0 0  PHQ - 2 Score 0 0  Altered sleeping - 0  Tired, decreased energy - 0  Change in appetite - 0  Feeling bad or failure about yourself  - 0  Trouble concentrating - 0  Moving slowly or fidgety/restless - 0  Suicidal thoughts - 0  PHQ-9 Score - 0     Lab Results  Component Value Date   CHOL 203 (H) 07/31/2018   HDL 56.90 07/31/2018   LDLCALC 118 (H) 07/31/2018   TRIG 139.0 07/31/2018   CHOLHDL 4 07/31/2018   Lab Results  Component Value Date   CREATININE 0.72 07/31/2018   Lab Results  Component Value Date   NA 140 07/31/2018   K 4.3 07/31/2018   CL 104 07/31/2018   CO2 27 07/31/2018   Lab Results   Component Value Date   WBC 6.2 07/31/2018   HGB 13.3 07/31/2018   HCT 40.1 07/31/2018   MCV 86.8 07/31/2018   PLT 279.0 07/31/2018    Right heel pain- followed by Dr. Charlann Boxer- notes reviewed. Most recently was referred for EMG on right lower extremity due to her symptoms- done on 08/01/19 by Dr. Posey Pronto- note reviewed-  Results of EMG were normal:  Patient Complaints: This is a 53 year old female referred for evaluation of right heel pain.  NCV & EMG Findings: Extensive electrodiagnostic testing of the right lower extremity shows:  1. Right sural and superficial peroneal sensory responses are within normal limits. 2. Right peroneal and tibial motor responses are within normal limits. 3. Right tibial H reflex study is within normal limits. 4. There is no evidence of active or chronic motor axonal loss changes affecting any of the tested muscles.  Motor unit configuration and recruitment pattern is within normal limits.  Impression: This is a normal study of the right lower extremity.  In particular, there is no evidence of a sural mononeuropathy, sensorimotor polyneuropathy, or lumbosacral radiculopathy. Current Outpatient Medications on File Prior to Visit  Medication Sig Dispense Refill  . calcium-vitamin D (OSCAL WITH D) 500-200 MG-UNIT tablet Take 1 tablet by mouth.    Marland Kitchen  esomeprazole (NEXIUM) 20 MG capsule Take 20 mg by mouth daily at 12 noon.    . Multiple Vitamin (MULTIVITAMIN) tablet Take 1 tablet by mouth daily.    Marland Kitchen gabapentin (NEURONTIN) 100 MG capsule Take 2 capsules (200 mg total) by mouth at bedtime. (Patient not taking: Reported on 08/06/2019) 180 capsule 3  . Vitamin D, Ergocalciferol, (DRISDOL) 1.25 MG (50000 UT) CAPS capsule Take 1 capsule (50,000 Units total) by mouth every 7 (seven) days. (Patient not taking: Reported on 08/06/2019) 12 capsule 0   No current facility-administered medications on file prior to visit.     No Known Allergies  Past Medical  History:  Diagnosis Date  . Anemia    Iron deficiency  . B12 deficiency   . Cholelithiasis   . Depression   . GERD (gastroesophageal reflux disease)   . Migraines   . Osteopenia     Past Surgical History:  Procedure Laterality Date  . ABDOMINAL HYSTERECTOMY  2009   anterior and posterior repair  . COLONOSCOPY  2011  . ESOPHAGOGASTRODUODENOSCOPY ENDOSCOPY    . HERNIA REPAIR  2014   hiatal  . LASIK  2008  . TONSILLECTOMY    . TUBAL LIGATION  1999    Family History  Problem Relation Age of Onset  . Cancer Mother 33       breast     Social History   Socioeconomic History  . Marital status: Married    Spouse name: Not on file  . Number of children: Not on file  . Years of education: Not on file  . Highest education level: Not on file  Occupational History  . Not on file  Social Needs  . Financial resource strain: Not on file  . Food insecurity    Worry: Not on file    Inability: Not on file  . Transportation needs    Medical: Not on file    Non-medical: Not on file  Tobacco Use  . Smoking status: Former Smoker    Quit date: 02/14/1998    Years since quitting: 21.4  . Smokeless tobacco: Never Used  Substance and Sexual Activity  . Alcohol use: No  . Drug use: No  . Sexual activity: Not on file  Lifestyle  . Physical activity    Days per week: Not on file    Minutes per session: Not on file  . Stress: Not on file  Relationships  . Social Herbalist on phone: Not on file    Gets together: Not on file    Attends religious service: Not on file    Active member of club or organization: Not on file    Attends meetings of clubs or organizations: Not on file    Relationship status: Not on file  . Intimate partner violence    Fear of current or ex partner: Not on file    Emotionally abused: Not on file    Physically abused: Not on file    Forced sexual activity: Not on file  Other Topics Concern  . Not on file  Social History Narrative  . Not  on file   The PMH, PSH, Social History, Family History, Medications, and allergies have been reviewed in [facility], and have been updated if relevant.  Review of Systems  Constitutional: Negative.   HENT: Negative.   Eyes: Negative.   Respiratory: Negative.   Cardiovascular: Negative.   Gastrointestinal: Negative.   Endocrine: Negative.   Genitourinary: Negative.   Musculoskeletal:  Negative.   Skin: Negative for rash.       + cyst  Allergic/Immunologic: Negative.   Neurological: Positive for numbness.  Hematological: Negative.   Psychiatric/Behavioral: Negative.   All other systems reviewed and are negative.      Objective:    BP 120/88 (BP Location: Left Arm, Patient Position: Sitting, Cuff Size: Normal)   Pulse 65   Temp 98.3 F (36.8 C) (Oral)   Ht 5\' 3"  (1.6 m)   Wt 191 lb (86.6 kg)   SpO2 97%   BMI 33.83 kg/m    Physical Exam   General:  Well-developed,well-nourished,in no acute distress; alert,appropriate and cooperative throughout examination Head:  normocephalic and atraumatic.   Eyes:  vision grossly intact, PERRL Ears:  R ear normal and L ear normal externally, TMs clear bilaterally Nose:  no external deformity.   Mouth:  good dentition.   Neck:  No deformities, masses, or tenderness noted. Breasts:  No mass, nodules, thickening, tenderness, bulging, retraction, inflamation, nipple discharge or skin changes noted.   Lungs:  Normal respiratory effort, chest expands symmetrically. Lungs are clear to auscultation, no crackles or wheezes. Heart:  Normal rate and regular rhythm. S1 and S2 normal without gallop, murmur, click, rub or other extra sounds. Abdomen:  Bowel sounds positive,abdomen soft and non-tender without masses, organomegaly or hernias noted. Msk:  No deformity or scoliosis noted of thoracic or lumbar spine.   Extremities:  No clubbing, cyanosis, edema, or deformity noted with normal full range of motion of all joints.   Neurologic:  alert & oriented  X3 and gait normal.   Skin: large, inflamed epidermoid cyst, firm, not draining, warm Cervical Nodes:  No lymphadenopathy noted Axillary Nodes:  No palpable lymphadenopathy Psych:  Cognition and judgment appear intact. Alert and cooperative with normal attention span and concentration. No apparent delusions, illusions, hallucinations         Assessment & Plan:   Well woman exam without gynecological exam  Hypothyroidism, unspecified type - Plan: TSH, T4, free, T3, Comprehensive metabolic panel, levothyroxine (SYNTHROID) 75 MCG tablet  Vitamin D deficiency - Plan: Vitamin D (25 hydroxy)  Iron deficiency - Plan: Ferritin, CBC with Differential/Platelet  Intractable right heel pain  Hyperlipidemia, unspecified hyperlipidemia type - Plan: Lipid panel, Comprehensive metabolic panel No follow-ups on file.

## 2019-08-04 NOTE — Patient Instructions (Addendum)
Great to see you. I will call you with your lab results from today and you can view them online.   Cologuard will be mailed directly to your home.    Take doxycyline as directed- 1 tablet twice daily x 10 days.    Epidermal Cyst  An epidermal cyst is a small, painless lump under your skin. The cyst contains a grayish-white, bad-smelling substance (keratin). Do not try to pop or open an epidermal cyst yourself. What are the causes?  A blocked hair follicle.  A hair that curls and re-enters the skin instead of growing straight out of the skin.  A blocked pore.  Irritated skin.  An injury to the skin.  Certain conditions that are passed along from parent to child (inherited).  What increases the risk?  Having acne.  Being overweight.  Being 50-34 years old. What are the signs or symptoms? These cysts are usually harmless, but they can get infected. Symptoms of infection may include:  Redness.  Inflammation.  Tenderness.  Warmth.  Fever.  A grayish-white, bad-smelling substance drains from the cyst.  Pus drains from the cyst. How is this treated? In many cases, epidermal cysts go away on their own without treatment. If a cyst becomes infected, treatment may include:  Opening and draining the cyst, done by a doctor. After draining, you may need minor surgery to remove the rest of the cyst.  Antibiotic medicine.  Shots of medicines (steroids) that help to reduce inflammation.  Surgery to remove the cyst. Surgery may be done if the cyst: ? Becomes large. ? Bothers you. ? Has a chance of turning into cancer.  Do not try to open a cyst yourself. Follow these instructions at home:  Take over-the-counter and prescription medicines only as told by your doctor.  If you were prescribed an antibiotic medicine, take it it as told by your doctor. Do not stop using the antibiotic even if you start to feel better.  Keep the area around your cyst clean and  dry.  Wear loose, dry clothing.  Avoid touching your cyst.  Check your cyst every day for signs of infection. Check for: ? Redness, swelling, or pain. ? Fluid or blood. ? Warmth. ? Pus or a bad smell.  Keep all follow-up visits as told by your doctor. This is important. How is this prevented?  Wear clean, dry, clothing.  Avoid wearing tight clothing.  Keep your skin clean and dry. Take showers or baths every day. Contact a doctor if:  Your cyst has symptoms of infection.  Your condition does not improve or gets worse.  You have a cyst that looks different from other cysts you have had.  You have a fever. Get help right away if:  Redness spreads from the cyst into the area close by. Summary  An epidermal cyst is a sac made of skin tissue.  If a cyst becomes infected, treatment may include surgery to open and drain the cyst, or to remove it.  Take over-the-counter and prescription medicines only as told by your doctor.  Contact a doctor if your condition is not improving or is getting worse.  Keep all follow-up visits as told by your doctor. This is important. This information is not intended to replace advice given to you by your health care provider. Make sure you discuss any questions you have with your health care provider. Document Released: 10/14/2004 Document Revised: 12/28/2018 Document Reviewed: 06/15/2018 Elsevier Patient Education  2020 [employer].

## 2019-08-04 NOTE — Assessment & Plan Note (Signed)
Check CBC, ferritin today.

## 2019-08-06 ENCOUNTER — Telehealth: Payer: Self-pay

## 2019-08-06 ENCOUNTER — Other Ambulatory Visit: Payer: Self-pay

## 2019-08-06 ENCOUNTER — Ambulatory Visit (INDEPENDENT_AMBULATORY_CARE_PROVIDER_SITE_OTHER): Payer: Federal, State, Local not specified - PPO | Admitting: Family Medicine

## 2019-08-06 ENCOUNTER — Encounter: Payer: Self-pay | Admitting: Family Medicine

## 2019-08-06 VITALS — BP 120/88 | HR 65 | Temp 98.3°F | Ht 63.0 in | Wt 191.0 lb

## 2019-08-06 DIAGNOSIS — E611 Iron deficiency: Secondary | ICD-10-CM | POA: Diagnosis not present

## 2019-08-06 DIAGNOSIS — Z0000 Encounter for general adult medical examination without abnormal findings: Secondary | ICD-10-CM

## 2019-08-06 DIAGNOSIS — E559 Vitamin D deficiency, unspecified: Secondary | ICD-10-CM | POA: Diagnosis not present

## 2019-08-06 DIAGNOSIS — E039 Hypothyroidism, unspecified: Secondary | ICD-10-CM

## 2019-08-06 DIAGNOSIS — L720 Epidermal cyst: Secondary | ICD-10-CM | POA: Insufficient documentation

## 2019-08-06 DIAGNOSIS — E785 Hyperlipidemia, unspecified: Secondary | ICD-10-CM

## 2019-08-06 DIAGNOSIS — E538 Deficiency of other specified B group vitamins: Secondary | ICD-10-CM

## 2019-08-06 DIAGNOSIS — M79671 Pain in right foot: Secondary | ICD-10-CM

## 2019-08-06 MED ORDER — LEVOTHYROXINE SODIUM 75 MCG PO TABS: ORAL_TABLET | ORAL | 0 refills | Status: DC

## 2019-08-06 MED ORDER — CYANOCOBALAMIN 1000 MCG/ML IJ SOLN: INTRAMUSCULAR | 3 refills | Status: DC

## 2019-08-06 MED ORDER — VITAMIN D (ERGOCALCIFEROL) 1.25 MG (50000 UNIT) PO CAPS: 50000.0000 [IU] | ORAL_CAPSULE | ORAL | 3 refills | Status: DC

## 2019-08-06 MED ORDER — DOXYCYCLINE HYCLATE 100 MG PO TABS: 100.0000 mg | ORAL_TABLET | Freq: Two times a day (BID) | ORAL | 0 refills | Status: DC

## 2019-08-06 NOTE — Telephone Encounter (Signed)
Questions for Screening COVID-19    Travel or Contacts: NO  During this illness, did/does the patient experience any of the following symptoms? Fever >100.39F []   Yes [x]   No []   Unknown Subjective fever (felt feverish) []   Yes [x]   No []   Unknown Chills []   Yes [x]   No []   Unknown Muscle aches (myalgia) []   Yes [x]   No []   Unknown Runny nose (rhinorrhea) []   Yes [x]   No []   Unknown Sore throat []   Yes [x]   No []   Unknown Cough (new onset or worsening of chronic cough) []   Yes [x]   No []   Unknown Shortness of breath (dyspnea) []   Yes [x]   No []   Unknown Nausea or vomiting []   Yes [x]   No []   Unknown Headache []   Yes [x]   No []   Unknown Abdominal pain  []   Yes [x]   No []   Unknown Diarrhea (?3 loose/looser than normal stools/24hr period) []   Yes [x]   No []   Unknown

## 2019-08-06 NOTE — Assessment & Plan Note (Signed)
New- treat with doxycycline 100 mg twice daily x 10 days. Supportive care as per AVS. Call or send my chart message prn if these symptoms worsen or fail to improve as anticipated. The patient indicates understanding of these issues and agrees with the plan.

## 2019-08-06 NOTE — Assessment & Plan Note (Signed)
Due for labs today.  Lab Results  Component Value Date   VD25OH 44.54 12/13/2018   VD25OH 31.37 10/18/2018   VD25OH 20.15 (L) 07/31/2018   \

## 2019-08-06 NOTE — Assessment & Plan Note (Addendum)
Reviewed preventive care protocols, scheduled due services, and updated immunizations Discussed nutrition, exercise, diet, and healthy lifestyle.  She agrees to cologuard.

## 2019-08-07 LAB — CBC WITH DIFFERENTIAL/PLATELET
Basophils Absolute: 0.1 10*3/uL (ref 0.0–0.1)
Basophils Relative: 1 % (ref 0.0–3.0)
Eosinophils Absolute: 0.1 10*3/uL (ref 0.0–0.7)
Eosinophils Relative: 1.6 % (ref 0.0–5.0)
HCT: 41.3 % (ref 36.0–46.0)
Hemoglobin: 13.5 g/dL (ref 12.0–15.0)
Lymphocytes Relative: 41.9 % (ref 12.0–46.0)
Lymphs Abs: 3 10*3/uL (ref 0.7–4.0)
MCHC: 32.7 g/dL (ref 30.0–36.0)
MCV: 87.7 fl (ref 78.0–100.0)
Monocytes Absolute: 0.8 10*3/uL (ref 0.1–1.0)
Monocytes Relative: 10.5 % (ref 3.0–12.0)
Neutro Abs: 3.2 10*3/uL (ref 1.4–7.7)
Neutrophils Relative %: 45 % (ref 43.0–77.0)
Platelets: 266 10*3/uL (ref 150.0–400.0)
RBC: 4.7 Mil/uL (ref 3.87–5.11)
RDW: 13.7 % (ref 11.5–15.5)
WBC: 7.2 10*3/uL (ref 4.0–10.5)

## 2019-08-07 LAB — LIPID PANEL
Cholesterol: 229 mg/dL — ABNORMAL HIGH (ref 0–200)
HDL: 59.1 mg/dL (ref >39.00)
LDL Cholesterol: 130 mg/dL — ABNORMAL HIGH (ref 0–99)
NonHDL: 169.95
Total CHOL/HDL Ratio: 4
Triglycerides: 198 mg/dL — ABNORMAL HIGH (ref 0.0–149.0)
VLDL: 39.6 mg/dL (ref 0.0–40.0)

## 2019-08-07 LAB — FERRITIN: Ferritin: 52.7 ng/mL (ref 10.0–291.0)

## 2019-08-07 LAB — COMPREHENSIVE METABOLIC PANEL
ALT: 36 U/L — ABNORMAL HIGH (ref 0–35)
AST: 24 U/L (ref 0–37)
Albumin: 4.6 g/dL (ref 3.5–5.2)
Alkaline Phosphatase: 107 U/L (ref 39–117)
BUN: 15 mg/dL (ref 6–23)
CO2: 25 mEq/L (ref 19–32)
Calcium: 9.9 mg/dL (ref 8.4–10.5)
Chloride: 105 mEq/L (ref 96–112)
Creatinine, Ser: 0.73 mg/dL (ref 0.40–1.20)
GFR: 82.87 mL/min (ref >60.00)
Glucose, Bld: 88 mg/dL (ref 70–99)
Potassium: 4.2 mEq/L (ref 3.5–5.1)
Sodium: 142 mEq/L (ref 135–145)
Total Bilirubin: 0.3 mg/dL (ref 0.2–1.2)
Total Protein: 7.2 g/dL (ref 6.0–8.3)

## 2019-08-07 LAB — VITAMIN D 25 HYDROXY (VIT D DEFICIENCY, FRACTURES): VITD: 37.74 ng/mL (ref 30.00–100.00)

## 2019-08-07 LAB — VITAMIN B12: Vitamin B-12: 328 pg/mL (ref 211–911)

## 2019-08-08 ENCOUNTER — Encounter: Payer: Self-pay | Admitting: Family Medicine

## 2019-08-08 DIAGNOSIS — E039 Hypothyroidism, unspecified: Secondary | ICD-10-CM

## 2019-08-09 ENCOUNTER — Encounter: Payer: Self-pay | Admitting: Family Medicine

## 2019-08-09 NOTE — Telephone Encounter (Signed)
I called pt and spoke with her about this and she informed me that she did not have hernia surgery.  I removed it from her surgical list.

## 2019-08-15 MED ORDER — LEVOTHYROXINE SODIUM 75 MCG PO TABS: ORAL_TABLET | ORAL | 3 refills | Status: DC

## 2019-08-21 ENCOUNTER — Encounter: Payer: Self-pay | Admitting: Family Medicine

## 2019-08-22 ENCOUNTER — Other Ambulatory Visit: Payer: Self-pay

## 2019-08-22 DIAGNOSIS — M79671 Pain in right foot: Secondary | ICD-10-CM

## 2019-08-22 DIAGNOSIS — G8929 Other chronic pain: Secondary | ICD-10-CM

## 2019-08-27 DIAGNOSIS — L0291 Cutaneous abscess, unspecified: Secondary | ICD-10-CM | POA: Diagnosis not present

## 2019-08-31 LAB — COLOGUARD: Cologuard: POSITIVE — AB

## 2019-09-05 ENCOUNTER — Other Ambulatory Visit: Payer: Self-pay

## 2019-09-05 ENCOUNTER — Encounter: Payer: Self-pay | Admitting: Physical Therapy

## 2019-09-05 ENCOUNTER — Ambulatory Visit: Payer: Federal, State, Local not specified - PPO | Attending: Family Medicine | Admitting: Physical Therapy

## 2019-09-05 DIAGNOSIS — M25571 Pain in right ankle and joints of right foot: Secondary | ICD-10-CM | POA: Diagnosis not present

## 2019-09-05 DIAGNOSIS — R262 Difficulty in walking, not elsewhere classified: Secondary | ICD-10-CM | POA: Diagnosis not present

## 2019-09-05 NOTE — Therapy (Signed)
[place], [place], [address] Phone: [phone]   Fax:  [phone]  Physical Therapy Evaluation  Patient Details  Name: Sabrina Mcdaniel MRN: [id number] Date of Birth: 01-17-1966 Referring Provider (PT): Hulan Saas, DO   Encounter Date: 09/05/2019  PT End of Session - 09/05/19 1750    Visit Number  1    Number of Visits  12    Date for PT Re-Evaluation  10/17/19    Authorization Type  [employer]    PT Start Time  1628    PT Stop Time  1723    PT Time Calculation (min)  55 min    Activity Tolerance  Patient tolerated treatment well    Behavior During Therapy  [facility] for tasks assessed/performed       Past Medical History:  Diagnosis Date  . Anemia    Iron deficiency  . B12 deficiency   . Cholelithiasis   . Depression   . GERD (gastroesophageal reflux disease)   . Hiatal hernia 2014  . Migraines   . Osteopenia     Past Surgical History:  Procedure Laterality Date  . ABDOMINAL HYSTERECTOMY  2009   anterior and posterior repair  . COLONOSCOPY  2011  . ESOPHAGOGASTRODUODENOSCOPY ENDOSCOPY    . LASIK  2008  . TONSILLECTOMY    . TUBAL LIGATION  1999    There were no vitals filed for this visit.   Subjective Assessment - 09/05/19 1636    Subjective  Pt. reports onset of right lateral heel pain last march-no specific mechanism of injury noted and with pain experienced primarily with weightbearing for standing and walking. Mild temporary relief with steroid medication and no significant relief with injections. Pt. had MRI of foot which was (-). Sural nerve irritation vs. lateral plantar nerve injury was suspected but EMG/NCS (-). Potential lumbar etiology also considered-pt. reports has had tenderness in right lateral thigh muscles with tightness noted by her massage therapist but otherwise symptoms are local to heel with no radiating from back though she does report back pain with prolonged standing  which is worsened with movements into forward flexion.    Pertinent History  osteopenia    Limitations  Standing;Walking    Diagnostic tests  MRI, [place], EMG/NCS    Patient Stated Goals  get rid of heel pain, be able to go camping/walk without pain    Currently in Pain?  Yes    Pain Score  2     Pain Orientation  Right;Lateral    Pain Descriptors / Indicators  --   "pulling"   Pain Type  Chronic pain    Pain Onset  More than a month ago    Pain Frequency  Intermittent    Aggravating Factors   standing and walking    Pain Relieving Factors  rest         [facility] PT Assessment - 09/05/19 0001      Assessment   Medical Diagnosis  Chronic right heel pain    Referring Provider (PT)  Hulan Saas, DO    Onset Date/Surgical Date  11/21/18    Hand Dominance  Right    Prior Therapy  none for current case      Precautions   Precautions  None      Restrictions   Weight Bearing Restrictions  No      Balance Screen   Has the patient fallen in the past 6 months  Yes  How many times?  1      Prior Function   Level of Independence  Independent with community mobility without device      Cognition   Overall Cognitive Status  Within Functional Limits for tasks assessed      Observation/Other Assessments   Focus on Therapeutic Outcomes (FOTO)   45% limited      Sensation   Light Touch  Appears Intact   L4-S2 dermatomes     ROM / Strength   AROM / PROM / Strength  AROM;Strength      AROM   Overall AROM Comments  trial lumbar extension with prone on elbows-no change in symptoms    AROM Assessment Site  Ankle    Right/Left Ankle  Right;Left    Right Ankle Dorsiflexion  10    Right Ankle Plantar Flexion  70    Right Ankle Inversion  40    Right Ankle Eversion  17    Left Ankle Dorsiflexion  10    Left Ankle Plantar Flexion  70    Left Ankle Inversion  45    Left Ankle Eversion  22      Strength   Strength Assessment Site  Ankle    Right/Left Ankle  Right;Left    Right  Ankle Dorsiflexion  5/5    Right Ankle Plantar Flexion  5/5    Right Ankle Inversion  5/5    Right Ankle Eversion  4+/5    Left Ankle Dorsiflexion  5/5    Left Ankle Plantar Flexion  5/5    Left Ankle Inversion  5/5    Left Ankle Eversion  5/5      Flexibility   Soft Tissue Assessment /Muscle Length  --   no sigificant gastroc tightness noted     Palpation   Palpation comment  Sore/TTP along right peroneals, sore/TTP also right lateral thigh in IT band/vastus lateralis region, peau d'orange noted with skin rolling in right lower lumbar region at L4-S1 region, TTP with trigger points right medial gastroc      Special Tests   Other special tests  SLR (-)                Objective measurements completed on examination: See above findings.      [facility] PT Treatment/Exercise - 09/05/19 0001      Exercises   Exercises  Ankle      Modalities   Modalities  Ultrasound      Ultrasound   Ultrasound Location  right lateral heel and posterior aspect plantar surface of heel    Ultrasound Parameters  3 MHZ 50% 1.0 W/cm2    Ultrasound Goals  Pain      Manual Therapy   Manual therapy comments  Trigger point ischemic compression right medial and lateral gastroc      Ankle Exercises: Stretches   Other Stretch  HEP instruction sural nerve floss, gastroc stretch, prone on elbows       Trigger Point Dry Needling - 09/05/19 0001    Consent Given?  Yes    Education Handout Provided  --   to be provided next session, pt. has had in the past   Muscles Treated Back/Hip  Lumbar multifidi    Dry Needling Comments  Right side lumbar multifidi L5-S1 needled in prone with 30 gauge 50 mm needles           PT Education - 09/05/19 1750    Education Details  potential symptom etiology,  HEP, POC, dry needling    Methods  Explanation;Demonstration;Verbal cues;Handout    Comprehension  Verbalized understanding;Returned demonstration          PT Long Term Goals - 09/05/19  1759      PT LONG TERM GOAL #1   Title  Independent with HEP    Baseline  needs HEP    Time  6    Period  Weeks    Status  New    Target Date  10/17/19      PT LONG TERM GOAL #2   Title  Improve FOTO outcome measure score to 33% or less impairment    Baseline  45% limited    Time  6    Period  Weeks    Status  New    Target Date  10/17/19      PT LONG TERM GOAL #3   Title  Tolerate standing/ambulation periods at least 30-40 minutes for IADLs and community mobility without limitation due to heel pain    Baseline  limited standing and walking tolerance    Time  6    Period  Weeks    Status  New    Target Date  10/17/19      PT LONG TERM GOAL #4   Title  Right ankle EV strength 5/5 for improve ankle stability for outdoor ambulation over uneven surfaces to improve ability for hiking while camping    Baseline  4+/5    Time  6    Period  Weeks    Status  New    Target Date  10/17/19             Plan - 09/05/19 1751    Clinical Impression Statement  Pt. presents with persistent right lateral heel pain of unclear etiology. EMG/NCS (-) but suspect there could be sural nerve tension so including nerve floss/stretches to address. Differential diagnosis could include lumbar radiculopathy though referral pattern would be somewhat atypical so given back pain exacerbated with flexion trial extension bias ROM to address. Plan trial modalities as well locally to heel region to help address local pain and inflammation. Though this would not explain lateral heel pain symptoms aspect of pain on plantar surface could potentially be associated with plantar fasciitis as well.    Personal Factors and Comorbidities  Time since onset of injury/illness/exacerbation   unclear symptom etiology   Examination-Activity Limitations  Stand;Locomotion Level    Examination-Participation Restrictions  Art gallery manager;Shop   camping   Stability/Clinical Decision Making  Evolving/Moderate  complexity    Clinical Decision Making  Moderate    Rehab Potential  Good    PT Frequency  --   1-2x/week   PT Duration  6 weeks    PT Treatment/Interventions  ADLs/Self Care Home Management;Electrical Stimulation;Iontophoresis 4mg /ml Dexamethasone;Moist Heat;Cryotherapy;Ultrasound;Therapeutic exercise;Functional mobility training;Therapeutic activities;Neuromuscular re-education;Dry needling;Manual techniques;Taping;Patient/family education    PT Next Visit Plan  provide dry needling handout, check response HEP-any change with lumbar extension? review HEP as needed, continue [place] right lateral heel, consider ionto, gastroc/peroneal stretches and STM prn, did dry needling (lumbar) help?    PT Home Exercise Plan  sural nerve floss (ankle PF/EV with neck flexion, DF/IV on return, gastroc stretch, trial prone press ups for possible lumbar etiology (pt. to discontinue if no benefit noted after consistent performance for several days    Consulted and Agree with Plan of Care  Patient       Patient will benefit from skilled therapeutic intervention in order to  improve the following deficits and impairments:  Pain, Decreased strength, Decreased activity tolerance, Difficulty walking  Visit Diagnosis: Pain in joint involving right ankle and foot  Difficulty in walking, not elsewhere classified     Problem List Patient Active Problem List   Diagnosis Date Noted  . Epidermoid cyst of skin 08/06/2019  . Intractable right heel pain 05/09/2019  . Peroneal tendinitis of right lower leg 02/28/2019  . Well woman exam without gynecological exam 07/31/2018  . Vitamin D deficiency 09/01/2017  . Iron deficiency 07/08/2017  . Hypothyroidism 05/27/2017  . OTHER CONSTIPATION 01/13/2010    Beaulah Dinning, PT, DPT 09/05/19 6:04 PM  [facility] [address] [place], [place], [address] Phone: [phone]   Fax:  [phone]  Name: Sabrina Mcdaniel MRN: [id number] Date of Birth: June 02, 1966

## 2019-09-18 ENCOUNTER — Ambulatory Visit: Payer: Federal, State, Local not specified - PPO | Admitting: Physical Therapy

## 2019-09-26 ENCOUNTER — Ambulatory Visit: Payer: Federal, State, Local not specified - PPO | Attending: Family Medicine | Admitting: Physical Therapy

## 2019-09-26 ENCOUNTER — Other Ambulatory Visit: Payer: Self-pay

## 2019-09-26 ENCOUNTER — Encounter: Payer: Self-pay | Admitting: Physical Therapy

## 2019-09-26 DIAGNOSIS — M25571 Pain in right ankle and joints of right foot: Secondary | ICD-10-CM | POA: Diagnosis not present

## 2019-09-26 DIAGNOSIS — R262 Difficulty in walking, not elsewhere classified: Secondary | ICD-10-CM | POA: Insufficient documentation

## 2019-09-26 NOTE — Therapy (Signed)
[place], [place], [address] Phone: [phone]   Fax:  [phone]  Physical Therapy Treatment  Patient Details  Name: Sabrina Mcdaniel MRN: [id number] Date of Birth: [birth year] Referring Provider (PT): Hulan Saas, DO   Encounter Date: 09/26/2019  PT End of Session - 09/26/19 1804    Visit Number  2    Number of Visits  12    Date for PT Re-Evaluation  10/17/19    Authorization Type  BCBS    PT Start Time  1630    PT Stop Time  [id number]    PT Time Calculation (min)  45 min    Activity Tolerance  Patient tolerated treatment well    Behavior During Therapy  [facility] for tasks assessed/performed       Past Medical History:  Diagnosis Date  . Anemia    Iron deficiency  . B12 deficiency   . Cholelithiasis   . Depression   . GERD (gastroesophageal reflux disease)   . Hiatal hernia 2014  . Migraines   . Osteopenia     Past Surgical History:  Procedure Laterality Date  . ABDOMINAL HYSTERECTOMY  2009   anterior and posterior repair  . COLONOSCOPY  2011  . ESOPHAGOGASTRODUODENOSCOPY ENDOSCOPY    . LASIK  2008  . TONSILLECTOMY    . TUBAL LIGATION  1999    There were no vitals filed for this visit.  Subjective Assessment - 09/26/19 1800    Subjective  Pt. returns for first follow up since initial eval-she had been scheduled last week but visit was cancelled due to therapist out. No change/improvement yet in symptoms. Pt. had previoulsy marked regions of pain on lateral and plantar aspect of heel with marker so showed therapist photo to help localize pain.                       [facility] Adult PT Treatment/Exercise - 09/26/19 0001      Modalities   Modalities  Iontophoresis      Ultrasound   Ultrasound Location  right lateral heel and plantar aspect of heel    Ultrasound Parameters  3 MHZ 50% 1.0 W/cm2 x 8 min    Ultrasound Goals  Pain      Iontophoresis   Type of Iontophoresis   Dexamethasone    Location  right lateral heel    Dose  4 mg/mL   take home patch   Time  8      Manual Therapy   Manual Therapy  Soft tissue mobilization    Soft tissue mobilization  STM and IASTM right peroneals incl. roller use      Ankle Exercises: Seated   Other Seated Ankle Exercises  HEP instruction and handout review ankle Theraband 4-way       Trigger Point Dry Needling - 09/26/19 0001    Consent Given?  Yes    Education Handout Provided  --   verbal education   Muscles Treated Lower Quadrant  Peroneals;Anterior tibialis    Dry Needling Comments  needled with 32 gauge 30 mm needles in left sidelying    Peroneals Response  Twitch response elicited           PT Education - 09/26/19 1801    Education Details  iontophoresis, potential symptom etiology, HEP updates    Person(s) Educated  Patient    Methods  Explanation    Comprehension  Verbalized understanding  PT Long Term Goals - 09/05/19 1759      PT LONG TERM GOAL #1   Title  Independent with HEP    Baseline  needs HEP    Time  6    Period  Weeks    Status  New    Target Date  10/17/19      PT LONG TERM GOAL #2   Title  Improve FOTO outcome measure score to 33% or less impairment    Baseline  45% limited    Time  6    Period  Weeks    Status  New    Target Date  10/17/19      PT LONG TERM GOAL #3   Title  Tolerate standing/ambulation periods at least 30-40 minutes for IADLs and community mobility without limitation due to heel pain    Baseline  limited standing and walking tolerance    Time  6    Period  Weeks    Status  New    Target Date  10/17/19      PT LONG TERM GOAL #4   Title  Right ankle EV strength 5/5 for improve ankle stability for outdoor ambulation over uneven surfaces to improve ability for hiking while camping    Baseline  4+/5    Time  6    Period  Weeks    Status  New    Target Date  10/17/19            Plan - 09/26/19 1805    Clinical Impression  Statement  Brief exercise time for HEP update but given pt. has been performing extensive exercises at home more focus hands on and passive tx. with manual therapy, modalities and brief dry needling. Still unclear symptom etiology but performed local modalities with [place] and ionto to region of pain. For manual work as well as dry needling tightness noted with local tenderness in peroneals so focused tx. accordingly-no improvement yet but given chronic symptom history and duration expect potential improvement may take more time so will continue to monitor.    Personal Factors and Comorbidities  Time since onset of injury/illness/exacerbation    Examination-Activity Limitations  Stand;Locomotion Level    Examination-Participation Restrictions  Art gallery manager;Shop    Stability/Clinical Decision Making  Evolving/Moderate complexity    Clinical Decision Making  Moderate    Rehab Potential  Good    PT Frequency  --   1-2x/week   PT Duration  6 weeks    PT Treatment/Interventions  ADLs/Self Care Home Management;Electrical Stimulation;Iontophoresis 4mg /ml Dexamethasone;Moist Heat;Cryotherapy;Ultrasound;Therapeutic exercise;Functional mobility training;Therapeutic activities;Neuromuscular re-education;Dry needling;Manual techniques;Taping;Patient/family education    PT Next Visit Plan  check response ionto, manual and dry needling, continue [place], ankle ROM/stretches/strengthening as tolerated    PT Home Exercise Plan  sural nerve floss (ankle PF/EV with neck flexion, DF/IV on return, gastroc stretch, ankle Theraband 4-way    Consulted and Agree with Plan of Care  Patient       Patient will benefit from skilled therapeutic intervention in order to improve the following deficits and impairments:  Pain, Decreased strength, Decreased activity tolerance, Difficulty walking  Visit Diagnosis: Pain in joint involving right ankle and foot  Difficulty in walking, not elsewhere classified     Problem  List Patient Active Problem List   Diagnosis Date Noted  . Epidermoid cyst of skin 08/06/2019  . Intractable right heel pain 05/09/2019  . Peroneal tendinitis of right lower leg 02/28/2019  . Well woman exam without gynecological  exam 07/31/2018  . Vitamin D deficiency 09/01/2017  . Iron deficiency 07/08/2017  . Hypothyroidism 05/27/2017  . OTHER CONSTIPATION 01/13/2010    Beaulah Dinning, PT, DPT 09/26/19 6:09 PM  [facility] [address] [place], [place], [address] Phone: [phone]   Fax:  [phone]  Name: Sabrina Mcdaniel MRN: [id number] Date of Birth: 03-01-1966

## 2019-09-28 ENCOUNTER — Encounter: Payer: Self-pay | Admitting: Physical Therapy

## 2019-09-28 ENCOUNTER — Other Ambulatory Visit: Payer: Self-pay

## 2019-09-28 ENCOUNTER — Ambulatory Visit: Payer: Federal, State, Local not specified - PPO | Admitting: Physical Therapy

## 2019-09-28 DIAGNOSIS — M25571 Pain in right ankle and joints of right foot: Secondary | ICD-10-CM | POA: Diagnosis not present

## 2019-09-28 DIAGNOSIS — R262 Difficulty in walking, not elsewhere classified: Secondary | ICD-10-CM | POA: Diagnosis not present

## 2019-09-28 NOTE — Therapy (Signed)
[place], [place], [address] Phone: [phone]   Fax:  [phone]  Physical Therapy Treatment  Patient Details  Name: Sabrina Mcdaniel MRN: [id number] Date of Birth: 02/24/1966 Referring Provider (PT): Hulan Saas, DO   Encounter Date: 09/28/2019  PT End of Session - 09/28/19 0808    Visit Number  3    Number of Visits  12    Date for PT Re-Evaluation  10/17/19    Authorization Type  BCBS    PT Start Time  0714    PT Stop Time  0756    PT Time Calculation (min)  42 min    Activity Tolerance  Patient tolerated treatment well    Behavior During Therapy  [facility] for tasks assessed/performed       Past Medical History:  Diagnosis Date  . Anemia    Iron deficiency  . B12 deficiency   . Cholelithiasis   . Depression   . GERD (gastroesophageal reflux disease)   . Hiatal hernia 2014  . Migraines   . Osteopenia     Past Surgical History:  Procedure Laterality Date  . ABDOMINAL HYSTERECTOMY  2009   anterior and posterior repair  . COLONOSCOPY  2011  . ESOPHAGOGASTRODUODENOSCOPY ENDOSCOPY    . LASIK  2008  . TONSILLECTOMY    . TUBAL LIGATION  1999    There were no vitals filed for this visit.  Subjective Assessment - 09/28/19 0759    Subjective  Pt. reports last tx. session helpful for decreased pain-woke up the morning after tx. with improvement in symptoms.         [facility] PT Assessment - 09/28/19 0001      Strength   Right Ankle Eversion  5/5                   [facility] Adult PT Treatment/Exercise - 09/28/19 0001      Ultrasound   Ultrasound Location  right lateral heel and plantar aspect of heel    Ultrasound Parameters  3 MHZ 50% 1.0 W/cm2 x 8 min    Ultrasound Goals  Pain      Iontophoresis   Type of Iontophoresis  Dexamethasone    Location  right lateral heel    Dose  4 mg/mL   take home patch   Time  8      Manual Therapy   Manual Therapy  Soft tissue mobilization     Soft tissue mobilization  STM and IASTM right peroneals incl. roller use      Ankle Exercises: Stretches   Other Stretch  supine manual gastroc/peroneal stretch 20 sec x 3       Trigger Point Dry Needling - 09/28/19 0001    Consent Given?  Yes    Muscles Treated Lower Quadrant  Peroneals;Anterior tibialis    Dry Needling Comments  needled with 32 gauge 30 mm needles in left sidelying, 3 min to insert needles with needles left in situ during ionto                PT Long Term Goals - 09/28/19 0853      PT LONG TERM GOAL #1   Title  Independent with HEP    Baseline  met    Time  6    Period  Weeks    Status  Achieved      PT LONG TERM GOAL #2   Title  Improve FOTO outcome measure score  to 33% or less impairment    Baseline  45% limited    Time  6    Period  Weeks    Status  On-going      PT LONG TERM GOAL #3   Title  Tolerate standing/ambulation periods at least 30-40 minutes for IADLs and community mobility without limitation due to heel pain    Baseline  limited standing and walking tolerance    Time  6    Period  Weeks    Status  On-going      PT LONG TERM GOAL #4   Title  Right ankle EV strength 5/5 for improve ankle stability for outdoor ambulation over uneven surfaces to improve ability for hiking while camping    Baseline  5/5    Time  6    Period  Weeks    Status  Achieved            Plan - 09/28/19 0850    Clinical Impression Statement  Improve tx. response with addition manual work to peroneals in addition to ionto so continued previous tx. focus. Improve ankle EV strength from baseline as well.    Personal Factors and Comorbidities  Time since onset of injury/illness/exacerbation    Examination-Activity Limitations  Stand;Locomotion Level    Examination-Participation Restrictions  Art gallery manager;Shop    Stability/Clinical Decision Making  Evolving/Moderate complexity    Clinical Decision Making  Moderate    Rehab Potential   Good    PT Frequency  --   1-2x/week   PT Duration  6 weeks    PT Treatment/Interventions  ADLs/Self Care Home Management;Electrical Stimulation;Iontophoresis 56m/ml Dexamethasone;Moist Heat;Cryotherapy;Ultrasound;Therapeutic exercise;Functional mobility training;Therapeutic activities;Neuromuscular re-education;Dry needling;Manual techniques;Taping;Patient/family education    PT Next Visit Plan  continue ionto, manual and dry needling, continue [place] ankle ROM/stretches/strengthening as tolerated    PT Home Exercise Plan  sural nerve floss (ankle PF/EV with neck flexion, DF/IV on return, gastroc stretch, ankle Theraband 4-way    Consulted and Agree with Plan of Care  Patient       Patient will benefit from skilled therapeutic intervention in order to improve the following deficits and impairments:  Pain, Decreased strength, Decreased activity tolerance, Difficulty walking  Visit Diagnosis: Pain in joint involving right ankle and foot  Difficulty in walking, not elsewhere classified     Problem List Patient Active Problem List   Diagnosis Date Noted  . Epidermoid cyst of skin 08/06/2019  . Intractable right heel pain 05/09/2019  . Peroneal tendinitis of right lower leg 02/28/2019  . Well woman exam without gynecological exam 07/31/2018  . Vitamin D deficiency 09/01/2017  . Iron deficiency 07/08/2017  . Hypothyroidism 05/27/2017  . OTHER CONSTIPATION 01/13/2010    CBeaulah Dinning PT, DPT 09/28/19 8:56 AM  [facility] [address]Phone: [phone]  Fax:  [phone] Name: MSELITA STAIGERMRN: [id number] of Birth: [birth year]

## 2019-10-03 ENCOUNTER — Ambulatory Visit: Payer: Federal, State, Local not specified - PPO | Admitting: Physical Therapy

## 2019-10-03 ENCOUNTER — Encounter: Payer: Self-pay | Admitting: Physical Therapy

## 2019-10-03 ENCOUNTER — Other Ambulatory Visit: Payer: Self-pay

## 2019-10-03 DIAGNOSIS — R262 Difficulty in walking, not elsewhere classified: Secondary | ICD-10-CM | POA: Diagnosis not present

## 2019-10-03 DIAGNOSIS — M25571 Pain in right ankle and joints of right foot: Secondary | ICD-10-CM | POA: Diagnosis not present

## 2019-10-03 NOTE — Therapy (Signed)
[place], [place], [address] Phone: [phone]   Fax:  [phone]  Physical Therapy Treatment  Patient Details  Name: Sabrina Mcdaniel MRN: [id number] Date of Birth: October 31, 1965 Referring Provider (PT): Hulan Saas, DO   Encounter Date: 10/03/2019  PT End of Session - 10/03/19 1752    Visit Number  4    Number of Visits  12    Date for PT Re-Evaluation  10/17/19    Authorization Type  BCBS    PT Start Time  1630    PT Stop Time  6283    PT Time Calculation (min)  45 min    Activity Tolerance  Patient tolerated treatment well    Behavior During Therapy  [facility] for tasks assessed/performed       Past Medical History:  Diagnosis Date  . Anemia    Iron deficiency  . B12 deficiency   . Cholelithiasis   . Depression   . GERD (gastroesophageal reflux disease)   . Hiatal hernia 2014  . Migraines   . Osteopenia     Past Surgical History:  Procedure Laterality Date  . ABDOMINAL HYSTERECTOMY  2009   anterior and posterior repair  . COLONOSCOPY  2011  . ESOPHAGOGASTRODUODENOSCOPY ENDOSCOPY    . LASIK  2008  . TONSILLECTOMY    . TUBAL LIGATION  1999    There were no vitals filed for this visit.  Subjective Assessment - 10/03/19 1627    Subjective  Still noting some relief after last sessoin but feels ionto patch was more beneficial on plantar aspect of foot. Also noting some discomfort in lateral aspect of food.    Currently in Pain?  Yes    Pain Score  2     Pain Location  Heel    Pain Orientation  Right;Lateral    Pain Type  Chronic pain    Pain Onset  More than a month ago    Pain Frequency  Intermittent    Aggravating Factors   standing and walking    Pain Relieving Factors  rest                       [facility] Adult PT Treatment/Exercise - 10/03/19 0001      Ultrasound   Ultrasound Location  right lateral and plantar aspect of heel    Ultrasound Parameters  3 MHZ 50% 1.0  W/cm2 x 8 min    Ultrasound Goals  Pain      Iontophoresis   Type of Iontophoresis  Dexamethasone    Location  right lateral/plantar aspect of heel    Dose  4 mg/mL   take home patch   Time  8      Manual Therapy   Manual Therapy  Soft tissue mobilization    Soft tissue mobilization  STM and IASTM right peroneals incl. roller use       Trigger Point Dry Needling - 10/03/19 0001    Consent Given?  Yes    Muscles Treated Lower Quadrant  Peroneals;Anterior tibialis   incl. right abductor digiti minimi   Dry Needling Comments  needling in left sidelying with 32 gauge 30 mm needles           PT Education - 10/03/19 1752    Education Details  foot/muscular anatomy, POC, use of cryo vs. heat    Person(s) Educated  Patient    Methods  Explanation    Comprehension  Verbalized understanding          PT Long Term Goals - 09/28/19 0853      PT LONG TERM GOAL #1   Title  Independent with HEP    Baseline  met    Time  6    Period  Weeks    Status  Achieved      PT LONG TERM GOAL #2   Title  Improve FOTO outcome measure score to 33% or less impairment    Baseline  45% limited    Time  6    Period  Weeks    Status  On-going      PT LONG TERM GOAL #3   Title  Tolerate standing/ambulation periods at least 30-40 minutes for IADLs and community mobility without limitation due to heel pain    Baseline  limited standing and walking tolerance    Time  6    Period  Weeks    Status  On-going      PT LONG TERM GOAL #4   Title  Right ankle EV strength 5/5 for improve ankle stability for outdoor ambulation over uneven surfaces to improve ability for hiking while camping    Baseline  5/5    Time  6    Period  Weeks    Status  Achieved            Plan - 10/03/19 1753    Clinical Impression Statement  Continues with more improvement than after initial sessions for decreased heel pain. Still with functional limitations impacting ability for community mobility and shopping  but making improvements with tolerance from baseline. More "plantar" focus for ionto placement today due to more benefit noted with previous placement in this region. Progress ongoing re: therapy goals.    Personal Factors and Comorbidities  Time since onset of injury/illness/exacerbation    Examination-Activity Limitations  Stand;Locomotion Level    Examination-Participation Restrictions  Art gallery manager;Shop    Stability/Clinical Decision Making  Evolving/Moderate complexity    Clinical Decision Making  Moderate    Rehab Potential  Good    PT Frequency  --   1-2x/week   PT Duration  6 weeks    PT Treatment/Interventions  ADLs/Self Care Home Management;Electrical Stimulation;Iontophoresis 88m/ml Dexamethasone;Moist Heat;Cryotherapy;Ultrasound;Therapeutic exercise;Functional mobility training;Therapeutic activities;Neuromuscular re-education;Dry needling;Manual techniques;Taping;Patient/family education    PT Next Visit Plan  continue ionto, manual and dry needling, continue [place] ankle ROM/stretches/strengthening as tolerated    PT Home Exercise Plan  sural nerve floss (ankle PF/EV with neck flexion, DF/IV on return, gastroc stretch, ankle Theraband 4-way    Consulted and Agree with Plan of Care  Patient       Patient will benefit from skilled therapeutic intervention in order to improve the following deficits and impairments:  Pain, Decreased strength, Decreased activity tolerance, Difficulty walking  Visit Diagnosis: Pain in joint involving right ankle and foot  Difficulty in walking, not elsewhere classified     Problem List Patient Active Problem List   Diagnosis Date Noted  . Epidermoid cyst of skin 08/06/2019  . Intractable right heel pain 05/09/2019  . Peroneal tendinitis of right lower leg 02/28/2019  . Well woman exam without gynecological exam 07/31/2018  . Vitamin D deficiency 09/01/2017  . Iron deficiency 07/08/2017  . Hypothyroidism 05/27/2017  . OTHER  CONSTIPATION 01/13/2010    CBeaulah Dinning PT, DPT 10/03/19 7:49 PM  [facility] [address]Phone: [phone]  Fax:  [phone]  Name: Sabrina Mcdaniel MRN: [id number] Date of Birth: May 19, 1966

## 2019-10-05 ENCOUNTER — Ambulatory Visit: Payer: Federal, State, Local not specified - PPO | Admitting: Physical Therapy

## 2019-10-05 ENCOUNTER — Other Ambulatory Visit: Payer: Self-pay

## 2019-10-05 ENCOUNTER — Encounter: Payer: Self-pay | Admitting: Physical Therapy

## 2019-10-05 DIAGNOSIS — R262 Difficulty in walking, not elsewhere classified: Secondary | ICD-10-CM | POA: Diagnosis not present

## 2019-10-05 DIAGNOSIS — M25571 Pain in right ankle and joints of right foot: Secondary | ICD-10-CM

## 2019-10-05 NOTE — Therapy (Signed)
[place], [place], [address] Phone: [phone]   Fax:  [phone]  Physical Therapy Treatment  Patient Details  Name: Sabrina Mcdaniel MRN: [id number] Date of Birth: 1966/08/10 Referring Provider (PT): Hulan Saas, DO   Encounter Date: 10/05/2019  PT End of Session - 10/05/19 0807    Visit Number  5    Number of Visits  12    Date for PT Re-Evaluation  10/17/19    Authorization Type  BCBS    PT Start Time  0714    PT Stop Time  0800    PT Time Calculation (min)  46 min    Activity Tolerance  Patient tolerated treatment well    Behavior During Therapy  [facility] for tasks assessed/performed       Past Medical History:  Diagnosis Date  . Anemia    Iron deficiency  . B12 deficiency   . Cholelithiasis   . Depression   . GERD (gastroesophageal reflux disease)   . Hiatal hernia 2014  . Migraines   . Osteopenia     Past Surgical History:  Procedure Laterality Date  . ABDOMINAL HYSTERECTOMY  2009   anterior and posterior repair  . COLONOSCOPY  2011  . ESOPHAGOGASTRODUODENOSCOPY ENDOSCOPY    . LASIK  2008  . TONSILLECTOMY    . TUBAL LIGATION  1999    There were no vitals filed for this visit.  Subjective Assessment - 10/05/19 0716    Subjective  Symptoms "better but not cured." Notes pain at times with push off motion for gait.                       [facility] Adult PT Treatment/Exercise - 10/05/19 0001      Ultrasound   Ultrasound Location  right lateral and plantar aspect of heel    Ultrasound Parameters  3 MHZ 50% 1.0 W/cm2 x 8 min    Ultrasound Goals  Pain      Iontophoresis   Type of Iontophoresis  Dexamethasone    Location  right lateral/plantar aspect of heel    Dose  4 mg/mL   take home patch   Time  8      Manual Therapy   Manual Therapy  Soft tissue mobilization    Soft tissue mobilization  STM and IASTM right peroneals incl. roller use      Ankle Exercises:  Seated   Other Seated Ankle Exercises  seated eccentric for ankle PF and EV with blue Theraband-HEP instruction and performance approx. 10 reps             PT Education - 10/05/19 0806    Education Details  HEP updates-added eccentric for peroneals    Person(s) Educated  Patient    Methods  Explanation;Demonstration;Verbal cues;Tactile cues    Comprehension  Verbalized understanding;Returned demonstration          PT Long Term Goals - 09/28/19 0853      PT LONG TERM GOAL #1   Title  Independent with HEP    Baseline  met    Time  6    Period  Weeks    Status  Achieved      PT LONG TERM GOAL #2   Title  Improve FOTO outcome measure score to 33% or less impairment    Baseline  45% limited    Time  6    Period  Weeks    Status  On-going      PT LONG TERM GOAL #3   Title  Tolerate standing/ambulation periods at least 30-40 minutes for IADLs and community mobility without limitation due to heel pain    Baseline  limited standing and walking tolerance    Time  6    Period  Weeks    Status  On-going      PT LONG TERM GOAL #4   Title  Right ankle EV strength 5/5 for improve ankle stability for outdoor ambulation over uneven surfaces to improve ability for hiking while camping    Baseline  5/5    Time  6    Period  Weeks    Status  Achieved            Plan - 10/05/19 0807    Clinical Impression Statement  Pt. continuing to make improvements with decreased heel pain and also noting gait improvements with decreased tendency"foot slap" while walking. Still with functional limitations for prolonged standing and ambulation and would benefit from continued PT to address. Given some symptoms consistent with peroneal tendinopathy added eccentric in addition to previous tx. to address.    Personal Factors and Comorbidities  Time since onset of injury/illness/exacerbation    Examination-Activity Limitations  Stand;Locomotion Level    Examination-Participation Restrictions   Community Activity;Cleaning;Shop    Stability/Clinical Decision Making  Evolving/Moderate complexity    Clinical Decision Making  Moderate    Rehab Potential  Fair    PT Frequency  --   1-2x/week   PT Duration  6 weeks    PT Treatment/Interventions  ADLs/Self Care Home Management;Electrical Stimulation;Iontophoresis 4mg/ml Dexamethasone;Moist Heat;Cryotherapy;Ultrasound;Therapeutic exercise;Functional mobility training;Therapeutic activities;Neuromuscular re-education;Dry needling;Manual techniques;Taping;Patient/family education    PT Next Visit Plan  continue ionto, manual and dry needling, continue US, ankle ROM/stretches/strengthening as tolerated    PT Home Exercise Plan  sural nerve floss (ankle PF/EV with neck flexion, DF/IV on return, gastroc stretch, ankle Theraband 4-way    Consulted and Agree with Plan of Care  Patient       Patient will benefit from skilled therapeutic intervention in order to improve the following deficits and impairments:  Pain, Decreased strength, Decreased activity tolerance, Difficulty walking  Visit Diagnosis: Pain in joint involving right ankle and foot  Difficulty in walking, not elsewhere classified     Problem List Patient Active Problem List   Diagnosis Date Noted  . Epidermoid cyst of skin 08/06/2019  . Intractable right heel pain 05/09/2019  . Peroneal tendinitis of right lower leg 02/28/2019  . Well woman exam without gynecological exam 07/31/2018  . Vitamin D deficiency 09/01/2017  . Iron deficiency 07/08/2017  . Hypothyroidism 05/27/2017  . OTHER CONSTIPATION 01/13/2010    Christopher Zoch, PT, DPT 10/05/19 8:10 AM  [facility] [address] [place], [place], [address] Phone: [phone]   Fax:  [phone]  Name: Sabrina Mcdaniel MRN: [id number] Date of Birth: 11/21/1965   

## 2019-10-10 ENCOUNTER — Ambulatory Visit: Payer: Federal, State, Local not specified - PPO | Admitting: Gastroenterology

## 2019-10-11 ENCOUNTER — Ambulatory Visit: Payer: Federal, State, Local not specified - PPO | Admitting: Orthotics

## 2019-10-11 ENCOUNTER — Ambulatory Visit: Payer: Federal, State, Local not specified - PPO

## 2019-10-11 ENCOUNTER — Ambulatory Visit: Payer: Federal, State, Local not specified - PPO | Admitting: Podiatry

## 2019-10-11 ENCOUNTER — Other Ambulatory Visit: Payer: Self-pay

## 2019-10-11 ENCOUNTER — Encounter: Payer: Self-pay | Admitting: Podiatry

## 2019-10-11 DIAGNOSIS — M722 Plantar fascial fibromatosis: Secondary | ICD-10-CM | POA: Diagnosis not present

## 2019-10-11 DIAGNOSIS — M7671 Peroneal tendinitis, right leg: Secondary | ICD-10-CM

## 2019-10-11 NOTE — Progress Notes (Signed)
She presents today after having not seen her for a couple of years.  She presents today with pain to her right leg and lateral right foot.  States that she is seeing Dr. Tamala Julian for this has had an MRI which was negative a nerve conduction velocity exam which was negative and also MRI of the ankle which was negative.  She is also done physical therapy which has helped some but not a lot.  So she would like to try new set of orthotics.  States that Dr. Tamala Julian made her set but it was not very good.  ROS: Denies fever chills nausea vomiting muscle aches pains calf pain back pain chest pain shortness of breath.  Objective: No reproducible pain on the lateral aspect of the foot but with dorsiflexion and inversion she has pain on the lateral aspect of the foot and radiating along the peroneals.  She also has pain on direct palpation of the medial calcaneal tubercle of the right heel which made me pull of her MRI I reviewed the MRI which plainly says plantar fasciitis.  It had no findings of the peroneal tendons sural nerve vasculature or any neuromuscular or musculoskeletal structure.  Assessment: Plantar fasciitis right with lateral compensatory syndrome.  Plan: Recommended a new set of orthotics.  If this fails to alleviate her symptoms and physical therapy fails to alleviate her symptoms then we may need to consider steroidal anti-inflammatories.  Nonsteroidal anti-inflammatories bother her GI tract.

## 2019-10-12 ENCOUNTER — Ambulatory Visit: Payer: Federal, State, Local not specified - PPO | Admitting: Physical Therapy

## 2019-10-12 ENCOUNTER — Encounter: Payer: Self-pay | Admitting: Physical Therapy

## 2019-10-12 DIAGNOSIS — M25571 Pain in right ankle and joints of right foot: Secondary | ICD-10-CM

## 2019-10-12 DIAGNOSIS — R262 Difficulty in walking, not elsewhere classified: Secondary | ICD-10-CM

## 2019-10-12 NOTE — Therapy (Signed)
[place], [place], [address] Phone: [phone]   Fax:  [phone]  Physical Therapy Treatment/Recertification  Patient Details  Name: Sabrina Mcdaniel MRN: [id number] Date of Birth: [birth year] Referring Provider (PT): Hulan Saas, DO   Encounter Date: 10/12/2019  PT End of Session - 10/12/19 0714    Visit Number  6    Number of Visits  18    Date for PT Re-Evaluation  11/23/19    Authorization Type  BCBS    PT Start Time  0715    PT Stop Time  0757    PT Time Calculation (min)  42 min    Activity Tolerance  Patient tolerated treatment well    Behavior During Therapy  [facility] for tasks assessed/performed       Past Medical History:  Diagnosis Date  . Anemia    Iron deficiency  . B12 deficiency   . Cholelithiasis   . Depression   . GERD (gastroesophageal reflux disease)   . Hiatal hernia 2014  . Migraines   . Osteopenia     Past Surgical History:  Procedure Laterality Date  . ABDOMINAL HYSTERECTOMY  2009   anterior and posterior repair  . COLONOSCOPY  2011  . ESOPHAGOGASTRODUODENOSCOPY ENDOSCOPY    . LASIK  2008  . TONSILLECTOMY    . TUBAL LIGATION  1999    There were no vitals filed for this visit.  Subjective Assessment - 10/12/19 0956    Subjective  Pt. saw Dr. Milinda Pointer for her heel pain and was diagnosed with plantar fasciitis with more lateral symptoms suspected as associated with compensatory gait mechanics. She is also getting more orthotics made to help address. Mild benefit with therapy to date-pain is minimal with brief standing and ambulation but still with higher pain level limiting prolonged standing and ambulation and ability to perform walking exercise or hiking.    Pertinent History  osteopenia    Limitations  Standing;Walking    Diagnostic tests  MRI, [place], EMG/NCS    Patient Stated Goals  get rid of heel pain, be able to go camping/walk without pain    Currently in Pain?  Yes     Pain Score  2     Pain Location  Heel    Pain Orientation  Right;Lateral    Pain Type  Chronic pain    Pain Onset  More than a month ago    Pain Frequency  Intermittent    Aggravating Factors   prolonged standing    Pain Relieving Factors  rest    Effect of Pain on Daily Activities  limits tolerance for prolonged standing and walking         [facility] PT Assessment - 10/12/19 0001      Observation/Other Assessments   Focus on Therapeutic Outcomes (FOTO)   unable to assess FOTO due to website down-will reassess at future follow up visit      AROM   Right Ankle Dorsiflexion  12    Right Ankle Plantar Flexion  60    Right Ankle Inversion  40    Right Ankle Eversion  20      Strength   Right Ankle Dorsiflexion  5/5    Right Ankle Plantar Flexion  5/5    Right Ankle Inversion  5/5    Right Ankle Eversion  5/5                   [facility] Adult PT Treatment/Exercise -  10/12/19 0001      Ultrasound   Ultrasound Location  plantar + lateral aspect of  heel, plantar fascial    Ultrasound Parameters  3 MHZ 50% 1.0 W/cm2    Ultrasound Goals  Pain      Iontophoresis   Type of Iontophoresis  Dexamethasone    Location  right lateral/plantar aspect of heel    Dose  4 mg/mL   take home patch   Time  8      Manual Therapy   Soft tissue mobilization  Trigger point STM/ischemic compression right medial and lateral gastroc, IASTM with edge tool right plantar fascia      Ankle Exercises: Stretches   Plantar Fascia Stretch  --   HEP instruction plantar fascia stretch seated vs. standing            PT Education - 10/12/19 1333    Education Details  HEP updates, change in tx. focus to address plantar fasciitis, POC    Person(s) Educated  Patient    Methods  Explanation;Demonstration;Verbal cues    Comprehension  Verbalized understanding;Returned demonstration          PT Long Term Goals - 10/12/19 1338      PT LONG TERM GOAL #1   Title  Independent with HEP     Baseline  updated today    Time  6    Period  Weeks    Status  Achieved      PT LONG TERM GOAL #2   Title  Improve FOTO outcome measure score to 33% or less impairment    Baseline  45% limited, unable to assess today due to website down at time of tx. session, will reassess at future visit    Time  6    Period  Weeks    Status  On-going    Target Date  11/23/19      PT LONG TERM GOAL #3   Title  Tolerate standing/ambulation periods at least 30-40 minutes for IADLs and community mobility without limitation due to heel pain    Baseline  limited standing and walking tolerance    Time  6    Period  Weeks    Status  On-going    Target Date  11/23/19      PT LONG TERM GOAL #4   Title  Right ankle EV strength 5/5 for improve ankle stability for outdoor ambulation over uneven surfaces to improve ability for hiking while camping    Baseline  5/5    Time  6    Period  Weeks    Status  Achieved            Plan - 10/12/19 1334    Clinical Impression Statement  Mild improvement from baseline status with decreased heel pain but still limited in tolerance and ability prolonged standing and ambulation. Given plantar fasciitis diagnosis with potential for more lateral symptoms resulting from altered/compensatory gait mechanics plan continue PT with more emphasis treating plantar fasciitis for further functional gains.    Personal Factors and Comorbidities  Time since onset of injury/illness/exacerbation    Examination-Activity Limitations  Stand;Locomotion Level    Examination-Participation Restrictions  Art gallery manager;Shop    Stability/Clinical Decision Making  Evolving/Moderate complexity    Clinical Decision Making  Moderate    Rehab Potential  Fair    PT Frequency  --   1-2x/week   PT Duration  6 weeks    PT Treatment/Interventions  ADLs/Self Care Home Management;Electrical Stimulation;Iontophoresis  4mg /ml Dexamethasone;Moist Heat;Cryotherapy;Ultrasound;Therapeutic  exercise;Functional mobility training;Therapeutic activities;Neuromuscular re-education;Dry needling;Manual techniques;Taping;Patient/family education    PT Next Visit Plan  continue ionto 1-2 more times, continue manual focus plantar fascia and gastroc/soleus, dry needling prn, continue [place], ankle ROM/stretches/strengthening as tolerated    PT Home Exercise Plan  Gastroc and plantar fascia stretches, ankle Theraband 4-way    Consulted and Agree with Plan of Care  Patient       Patient will benefit from skilled therapeutic intervention in order to improve the following deficits and impairments:  Pain, Decreased strength, Decreased activity tolerance, Difficulty walking  Visit Diagnosis: Pain in joint involving right ankle and foot  Difficulty in walking, not elsewhere classified     Problem List Patient Active Problem List   Diagnosis Date Noted  . Epidermoid cyst of skin 08/06/2019  . Intractable right heel pain 05/09/2019  . Peroneal tendinitis of right lower leg 02/28/2019  . Well woman exam without gynecological exam 07/31/2018  . Vitamin D deficiency 09/01/2017  . Iron deficiency 07/08/2017  . Hypothyroidism 05/27/2017  . Midline cystocele 10/31/2014  . Urethrocele 10/31/2014  . Coccyx pain 10/17/2014  . Proteinuria 10/17/2014  . Rectocele 10/17/2014  . OTHER CONSTIPATION 01/13/2010    Beaulah Dinning, PT, DPT 10/12/19 1:40 PM  [facility] [address] [place], [place], [address] Phone: [phone]   Fax:  [phone]  Name: Sabrina Mcdaniel MRN: [id number] Date of Birth: 04/29/1966

## 2019-10-23 ENCOUNTER — Encounter: Payer: Self-pay | Admitting: Gastroenterology

## 2019-10-23 ENCOUNTER — Ambulatory Visit: Payer: Federal, State, Local not specified - PPO | Admitting: Gastroenterology

## 2019-10-23 VITALS — BP 110/80 | HR 54 | Temp 97.4°F | Ht 63.0 in | Wt 193.0 lb

## 2019-10-23 DIAGNOSIS — R195 Other fecal abnormalities: Secondary | ICD-10-CM | POA: Diagnosis not present

## 2019-10-23 NOTE — Progress Notes (Signed)
HPI: This is a very pleasant 54 year old woman who was referred to me by Lucille Passy, MD  to evaluate Cologuard positive stool.    Chief complaint is Cologuard positive colon cancer screening test  I last saw at the time of colonoscopy about 10 years ago.  She is actually done very well since then.  She has no bowel issues.  She has very nice regularity without any overt GI bleeding.  No significant abdominal pains.  She believes she has probably gained a few pounds since the pandemic started.  Colon cancer does not run in her family  Old Data Reviewed: I did a colonoscopy for her about 10 years ago, May 2011.  This was for chronic constipation.  I found and removed 2 subcentimeter sessile polyps.  Neither of these were precancerous.  Blood work November 2020 showed normal CBC  Cologuard test December 2020 was positive   Review of systems: Pertinent positive and negative review of systems were noted in the above HPI section. All other review negative.   Past Medical History:  Diagnosis Date  . Anemia    Iron deficiency  . B12 deficiency   . Cholelithiasis   . Depression   . GERD (gastroesophageal reflux disease)   . Hiatal hernia 2014  . Migraines   . Osteopenia     Past Surgical History:  Procedure Laterality Date  . ABDOMINAL HYSTERECTOMY  2009   anterior and posterior repair  . COLONOSCOPY  2011  . ESOPHAGOGASTRODUODENOSCOPY ENDOSCOPY    . LASIK  2008  . TONSILLECTOMY    . TUBAL LIGATION  1999    Current Outpatient Medications  Medication Sig Dispense Refill  . calcium-vitamin D (OSCAL WITH D) 500-200 MG-UNIT tablet Take 1 tablet by mouth.    . cyanocobalamin (,VITAMIN B-12,) 1000 MCG/ML injection Use as directed 3 mL 3  . esomeprazole (NEXIUM) 20 MG capsule Take 20 mg by mouth daily at 12 noon.    . Ferrous Sulfate (IRON) 325 (65 Fe) MG TABS     . levothyroxine (SYNTHROID) 75 MCG tablet Take 1 tablet by mouth daily. 90 tablet 3  . Multiple Vitamin  (MULTIVITAMIN) tablet Take 1 tablet by mouth daily.    . Multiple Vitamins-Minerals (ONE-A-DAY WOMENS 50+ ADVANTAGE) TABS     . Vitamin D, Ergocalciferol, (DRISDOL) 1.25 MG (50000 UT) CAPS capsule Take 1 capsule (50,000 Units total) by mouth every 7 (seven) days. 12 capsule 3   No current facility-administered medications for this visit.    Allergies as of 10/23/2019  . (No Known Allergies)    Family History  Problem Relation Age of Onset  . Cancer Mother 82       breast     Social History   Socioeconomic History  . Marital status: Married    Spouse name: Not on file  . Number of children: Not on file  . Years of education: Not on file  . Highest education level: Not on file  Occupational History  . Not on file  Tobacco Use  . Smoking status: Former Smoker    Quit date: 02/14/1998    Years since quitting: 21.7  . Smokeless tobacco: Never Used  Substance and Sexual Activity  . Alcohol use: No  . Drug use: No  . Sexual activity: Not on file  Other Topics Concern  . Not on file  Social History Narrative  . Not on file   Social Determinants of Health   Financial Resource Strain:   .  Difficulty of Paying Living Expenses: Not on file  Food Insecurity:   . Worried About Charity fundraiser in the Last Year: Not on file  . Ran Out of Food in the Last Year: Not on file  Transportation Needs:   . Lack of Transportation (Medical): Not on file  . Lack of Transportation (Non-Medical): Not on file  Physical Activity:   . Days of Exercise per Week: Not on file  . Minutes of Exercise per Session: Not on file  Stress:   . Feeling of Stress : Not on file  Social Connections:   . Frequency of Communication with Friends and Family: Not on file  . Frequency of Social Gatherings with Friends and Family: Not on file  . Attends Religious Services: Not on file  . Active Member of Clubs or Organizations: Not on file  . Attends Archivist Meetings: Not on file  . Marital  Status: Not on file  Intimate Partner Violence:   . Fear of Current or Ex-Partner: Not on file  . Emotionally Abused: Not on file  . Physically Abused: Not on file  . Sexually Abused: Not on file     Physical Exam: Temp (!) 97.4 F (36.3 C)   Ht 5\' 3"  (1.6 m)   Wt 193 lb (87.5 kg)   BMI 34.19 kg/m  Constitutional: generally well-appearing Psychiatric: alert and oriented x3 Eyes: extraocular movements intact Mouth: oral pharynx moist, no lesions Neck: supple no lymphadenopathy Cardiovascular: heart regular rate and rhythm Lungs: clear to auscultation bilaterally Abdomen: soft, nontender, nondistended, no obvious ascites, no peritoneal signs, normal bowel sounds Extremities: no lower extremity edema bilaterally Skin: no lesions on visible extremities   Assessment and plan: 54 y.o. female with Cologuard positive colon cancer screening test  We discussed the significance of her Cologuard positive colon cancer screening test.  She understands that this may indicate underlying colon cancer but also may be as innocent as a completely false positive result.  I recommended a colonoscopy at her soonest convenience to get more certainty here.  She understands and agrees and we will arrange for that to be done at her soonest convenience.   Please see the "Patient Instructions" section for addition details about the plan.   Owens Loffler, MD [facility] 10/23/2019, 8:39 AM  Cc: Lucille Passy, MD  Total time on date of encounter was 30  minutes (this included time spent preparing to see the patient reviewing records; obtaining and/or reviewing separately obtained history; performing a medically appropriate exam and/or evaluation; counseling and educating the patient and family if present; ordering medications, tests or procedures if applicable; and documenting clinical information in the health record).

## 2019-10-23 NOTE — Patient Instructions (Signed)
If you are age 55 or older, your body mass index should be between 23-30. Your Body mass index is 34.19 kg/m. If this is out of the aforementioned range listed, please consider follow up with your Primary Care Provider.  If you are age 106 or younger, your body mass index should be between 19-25. Your Body mass index is 34.19 kg/m. If this is out of the aformentioned range listed, please consider follow up with your Primary Care Provider.   It has been recommended to you by your physician that you have a colonoscopy completed. Per your request, we did not schedule the procedure(s) today. Please contact our office at [phone] should you decide to have the procedure completed. You will be scheduled for a pre-visit and procedure at that time.

## 2019-10-25 ENCOUNTER — Ambulatory Visit: Payer: Federal, State, Local not specified - PPO | Admitting: Orthotics

## 2019-10-25 ENCOUNTER — Other Ambulatory Visit: Payer: Self-pay

## 2019-10-25 DIAGNOSIS — M722 Plantar fascial fibromatosis: Secondary | ICD-10-CM

## 2019-10-25 NOTE — Progress Notes (Signed)
Patient came in today to p/up functional foot orthotics.   The orthotics were assessed to both fit and function.  The F/O addressed the biomechanical issues/pathologies as intended, offering good longitudinal arch support, proper offloading, and foot support. There weren't any signs of discomfort or irritation.  The F/O fit properly in footwear with minimal trimming/adjustments. 

## 2019-10-26 ENCOUNTER — Encounter: Payer: Self-pay | Admitting: Physical Therapy

## 2019-10-26 ENCOUNTER — Ambulatory Visit: Payer: Federal, State, Local not specified - PPO | Attending: Family Medicine | Admitting: Physical Therapy

## 2019-10-26 DIAGNOSIS — R262 Difficulty in walking, not elsewhere classified: Secondary | ICD-10-CM | POA: Diagnosis not present

## 2019-10-26 DIAGNOSIS — M25571 Pain in right ankle and joints of right foot: Secondary | ICD-10-CM | POA: Insufficient documentation

## 2019-10-26 NOTE — Therapy (Signed)
[place], [place], [address] Phone: [phone]   Fax:  [phone]  Physical Therapy Treatment  Patient Details  Name: Sabrina Mcdaniel MRN: [id number] Date of Birth: 1966/09/14 Referring Provider (PT): Hulan Saas, DO   Encounter Date: 10/26/2019  PT End of Session - 10/26/19 [id number]    Visit Number  7    Number of Visits  18    Date for PT Re-Evaluation  11/23/19    Authorization Type  BCBS    PT Start Time  0715    PT Stop Time  0805    PT Time Calculation (min)  50 min    Activity Tolerance  Patient tolerated treatment well    Behavior During Therapy  [facility] for tasks assessed/performed       Past Medical History:  Diagnosis Date  . Anemia    Iron deficiency  . B12 deficiency   . Cholelithiasis   . Depression   . GERD (gastroesophageal reflux disease)   . Hiatal hernia 2014  . Migraines   . Osteopenia     Past Surgical History:  Procedure Laterality Date  . ABDOMINAL HYSTERECTOMY  2009   anterior and posterior repair  . COLONOSCOPY  2011  . ESOPHAGOGASTRODUODENOSCOPY ENDOSCOPY    . LASIK  2008  . TONSILLECTOMY    . TUBAL LIGATION  1999    There were no vitals filed for this visit.  Subjective Assessment - 10/26/19 0714    Subjective  Overall improving since last session-got orthotics and may have worn them too much the first day so noting some soreness from that but otherwise doing better.    Currently in Pain?  Yes    Pain Score  1     Pain Location  Heel    Pain Orientation  Right;Lateral    Pain Type  Chronic pain    Pain Onset  More than a month ago    Pain Frequency  Intermittent    Aggravating Factors   prolonged standing    Pain Relieving Factors  rest                       [facility] Adult PT Treatment/Exercise - 10/26/19 0001      Manual Therapy   Soft tissue mobilization  STM/IASTM right gastroc, tibialis anterior, plantar fascia      Ankle Exercises:  Stretches   Plantar Fascia Stretch  3 reps;30 seconds    Soleus Stretch  3 reps;30 seconds    Gastroc Stretch  3 reps;30 seconds             PT Education - 10/26/19 0812    Education Details  POC    Person(s) Educated  Patient    Methods  Explanation    Comprehension  Verbalized understanding          PT Long Term Goals - 10/12/19 1338      PT LONG TERM GOAL #1   Title  Independent with HEP    Baseline  updated today    Time  6    Period  Weeks    Status  Achieved      PT LONG TERM GOAL #2   Title  Improve FOTO outcome measure score to 33% or less impairment    Baseline  45% limited, unable to assess today due to website down at time of tx. session, will reassess at future visit    Time  6  Period  Weeks    Status  On-going    Target Date  11/23/19      PT LONG TERM GOAL #3   Title  Tolerate standing/ambulation periods at least 30-40 minutes for IADLs and community mobility without limitation due to heel pain    Baseline  limited standing and walking tolerance    Time  6    Period  Weeks    Status  On-going    Target Date  11/23/19      PT LONG TERM GOAL #4   Title  Right ankle EV strength 5/5 for improve ankle stability for outdoor ambulation over uneven surfaces to improve ability for hiking while camping    Baseline  5/5    Time  6    Period  Weeks    Status  Achieved            Plan - 10/26/19 0813    Clinical Impression Statement  Improved progress for decreased heel pain with focus plantar fasciitis treatment with manual therapy and stretches. Given improvement held modalities with more manual therapy focus today. Progressing well with therapy goals. If progress continues plan d/c within the next 1-2 visits.    Personal Factors and Comorbidities  Time since onset of injury/illness/exacerbation    Examination-Activity Limitations  Stand;Locomotion Level    Examination-Participation Restrictions  Art gallery manager;Shop     Stability/Clinical Decision Making  Evolving/Moderate complexity    Clinical Decision Making  Moderate    Rehab Potential  Fair    PT Frequency  --   1-2x/week   PT Duration  6 weeks    PT Treatment/Interventions  ADLs/Self Care Home Management;Electrical Stimulation;Iontophoresis 4mg /ml Dexamethasone;Moist Heat;Cryotherapy;Ultrasound;Therapeutic exercise;Functional mobility training;Therapeutic activities;Neuromuscular re-education;Dry needling;Manual techniques;Taping;Patient/family education    PT Next Visit Plan  continue manual therapy focus with stretches prn, if progress continues tentative d/c within the next 1-2 visits-likely will have pt. try continuing management with HEP after last visit for a few weeks with plan of care to allow return if need for further skilled tx. otherwise d/c    PT Home Exercise Plan  Gastroc and plantar fascia stretches, ankle Theraband 4-way    Consulted and Agree with Plan of Care  Patient       Patient will benefit from skilled therapeutic intervention in order to improve the following deficits and impairments:  Pain, Decreased strength, Decreased activity tolerance, Difficulty walking  Visit Diagnosis: Pain in joint involving right ankle and foot  Difficulty in walking, not elsewhere classified     Problem List Patient Active Problem List   Diagnosis Date Noted  . Epidermoid cyst of skin 08/06/2019  . Intractable right heel pain 05/09/2019  . Peroneal tendinitis of right lower leg 02/28/2019  . Well woman exam without gynecological exam 07/31/2018  . Vitamin D deficiency 09/01/2017  . Iron deficiency 07/08/2017  . Hypothyroidism 05/27/2017  . Midline cystocele 10/31/2014  . Urethrocele 10/31/2014  . Coccyx pain 10/17/2014  . Proteinuria 10/17/2014  . Rectocele 10/17/2014  . OTHER CONSTIPATION 01/13/2010    Beaulah Dinning, PT, DPT 10/26/19 8:17 AM  [facility] [address] [place], [place], [address] Phone: [phone]   Fax:  [phone]  Name: DAMEKA EVANGELISTA MRN: [id number] Date of Birth: 09-Feb-1966

## 2019-10-31 ENCOUNTER — Ambulatory Visit: Payer: Federal, State, Local not specified - PPO | Admitting: Physical Therapy

## 2019-10-31 ENCOUNTER — Encounter: Payer: Self-pay | Admitting: Physical Therapy

## 2019-10-31 ENCOUNTER — Other Ambulatory Visit: Payer: Self-pay

## 2019-10-31 DIAGNOSIS — R262 Difficulty in walking, not elsewhere classified: Secondary | ICD-10-CM

## 2019-10-31 DIAGNOSIS — M25571 Pain in right ankle and joints of right foot: Secondary | ICD-10-CM | POA: Diagnosis not present

## 2019-10-31 NOTE — Therapy (Signed)
[place], [place], [address] Phone: [phone]   Fax:  [phone]  Physical Therapy Treatment  Patient Details  Name: Sabrina Mcdaniel MRN: [id number] Date of Birth: 01/20/1966 Referring Provider (PT): Hulan Saas, DO   Encounter Date: 10/31/2019  PT End of Session - 10/31/19 2025    Visit Number  8    Number of Visits  18    Date for PT Re-Evaluation  11/23/19    Authorization Type  [employer]    PT Start Time  1632    PT Stop Time  1713    PT Time Calculation (min)  41 min    Activity Tolerance  Patient tolerated treatment well    Behavior During Therapy  [facility] for tasks assessed/performed       Past Medical History:  Diagnosis Date  . Anemia    Iron deficiency  . B12 deficiency   . Cholelithiasis   . Depression   . GERD (gastroesophageal reflux disease)   . Hiatal hernia 2014  . Migraines   . Osteopenia     Past Surgical History:  Procedure Laterality Date  . ABDOMINAL HYSTERECTOMY  2009   anterior and posterior repair  . COLONOSCOPY  2011  . ESOPHAGOGASTRODUODENOSCOPY ENDOSCOPY    . LASIK  2008  . TONSILLECTOMY    . TUBAL LIGATION  1999    There were no vitals filed for this visit.  Subjective Assessment - 10/31/19 2023    Subjective  Plantar aspect of heel sire for 1-2 days after last session with some increased edema but (soreness) since resolved and improved with minimal current pain. Pt. noting enogh improvement to cancel remaining visits. She will try to continue with HEP with POC to remain open for a few weeks in case of need to return.                       [facility] PT Treatment/Exercise - 10/31/19 0001      Manual Therapy   Soft tissue mobilization  STM/IASTM right gastroc, tibialis anterior, plantar fascia, peroneals      Ankle Exercises: Stretches   Plantar Fascia Stretch  3 reps;30 seconds    Gastroc Stretch  3 reps;30 seconds             PT  Education - 10/31/19 2025    Education Details  POC    Person(s) Educated  Patient    Methods  Explanation    Comprehension  Verbalized understanding          PT Long Term Goals - 10/12/19 1338      PT LONG TERM GOAL #1   Title  Independent with HEP    Baseline  updated today    Time  6    Period  Weeks    Status  Achieved      PT LONG TERM GOAL #2   Title  Improve FOTO outcome measure score to 33% or less impairment    Baseline  45% limited, unable to assess today due to website down at time of tx. session, will reassess at future visit    Time  6    Period  Weeks    Status  On-going    Target Date  11/23/19      PT LONG TERM GOAL #3   Title  Tolerate standing/ambulation periods at least 30-40 minutes for IADLs and community mobility without limitation due to heel pain  Baseline  limited standing and walking tolerance    Time  6    Period  Weeks    Status  On-going    Target Date  11/23/19      PT LONG TERM GOAL #4   Title  Right ankle EV strength 5/5 for improve ankle stability for outdoor ambulation over uneven surfaces to improve ability for hiking while camping    Baseline  5/5    Time  6    Period  Weeks    Status  Achieved            Plan - 10/31/19 2026    Clinical Impression Statement  Pt. now doing well with minimal symptoms. She will try continuing via HEP for self-management for a few weeks and return only if needed otherwise d/c to HEP.    Personal Factors and Comorbidities  Time since onset of injury/illness/exacerbation    Examination-Activity Limitations  Stand;Locomotion Level    Examination-Participation Restrictions  Art gallery manager;Shop    Stability/Clinical Decision Making  Evolving/Moderate complexity    Clinical Decision Making  Moderate    Rehab Potential  Fair    PT Frequency  --   1-2x/week   PT Duration  6 weeks    PT Treatment/Interventions  ADLs/Self Care Home Management;Electrical Stimulation;Iontophoresis 4mg /ml  Dexamethasone;Moist Heat;Cryotherapy;Ultrasound;Therapeutic exercise;Functional mobility training;Therapeutic activities;Neuromuscular re-education;Dry needling;Manual techniques;Taping;Patient/family education    PT Next Visit Plan  if returning continue manual therapy focus to gastroc, plantar fascia, continue stretches, modalities prn    PT Home Exercise Plan  Gastroc and plantar fascia stretches, ankle Theraband 4-way    Consulted and Agree with Plan of Care  Patient       Patient will benefit from skilled therapeutic intervention in order to improve the following deficits and impairments:  Pain, Decreased strength, Decreased activity tolerance, Difficulty walking  Visit Diagnosis: Pain in joint involving right ankle and foot  Difficulty in walking, not elsewhere classified     Problem List Patient Active Problem List   Diagnosis Date Noted  . Epidermoid cyst of skin 08/06/2019  . Intractable right heel pain 05/09/2019  . Peroneal tendinitis of right lower leg 02/28/2019  . Well woman exam without gynecological exam 07/31/2018  . Vitamin D deficiency 09/01/2017  . Iron deficiency 07/08/2017  . Hypothyroidism 05/27/2017  . Midline cystocele 10/31/2014  . Urethrocele 10/31/2014  . Coccyx pain 10/17/2014  . Proteinuria 10/17/2014  . Rectocele 10/17/2014  . OTHER CONSTIPATION 01/13/2010    Beaulah Dinning, PT, DPT 10/31/19 8:29 PM  [facility] [address] [place], [place], [address] Phone: [phone]   Fax:  [phone]  Name: AILSA MASSAQUOI MRN: [id number] Date of Birth: 24-Jan-1966

## 2019-11-02 ENCOUNTER — Ambulatory Visit: Payer: Federal, State, Local not specified - PPO | Admitting: Physical Therapy

## 2019-11-07 ENCOUNTER — Ambulatory Visit: Payer: Federal, State, Local not specified - PPO | Admitting: Physical Therapy

## 2019-11-09 ENCOUNTER — Ambulatory Visit: Payer: Federal, State, Local not specified - PPO | Admitting: Physical Therapy

## 2019-11-13 NOTE — Progress Notes (Addendum)
Repeat previous f/o order. Marland Kitchen

## 2019-11-14 ENCOUNTER — Ambulatory Visit: Payer: Federal, State, Local not specified - PPO | Admitting: Physical Therapy

## 2019-11-16 ENCOUNTER — Ambulatory Visit: Payer: Federal, State, Local not specified - PPO | Admitting: Physical Therapy

## 2019-11-21 ENCOUNTER — Ambulatory Visit (AMBULATORY_SURGERY_CENTER): Payer: Self-pay | Admitting: *Deleted

## 2019-11-21 ENCOUNTER — Other Ambulatory Visit: Payer: Self-pay

## 2019-11-21 VITALS — Temp 97.7°F | Ht 63.0 in | Wt 195.0 lb

## 2019-11-21 DIAGNOSIS — R195 Other fecal abnormalities: Secondary | ICD-10-CM

## 2019-11-21 DIAGNOSIS — Z01818 Encounter for other preprocedural examination: Secondary | ICD-10-CM

## 2019-11-21 MED ORDER — NA SULFATE-K SULFATE-MG SULF 17.5-3.13-1.6 GM/177ML PO SOLN: ORAL | 0 refills | Status: DC

## 2019-11-21 NOTE — Progress Notes (Signed)
Patient is here in-person for PV. Patient denies any allergies to eggs or soy. Patient denies any problems with anesthesia/sedation. Patient denies any oxygen use at home. Patient denies taking any diet/weight loss medications or blood thinners. Patient is not being treated for MRSA or C-diff. EMMI education assisgned to the patient for the procedure, this was explained and instructions given to patient. COVID-19 screening test is on 3/16, the pt is aware. Pt is aware that care partner will wait in the car during procedure; if they feel like they will be too hot or cold to wait in the car; they may wait in the 4 th floor lobby. Patient is aware to bring only one care partner. We want them to wear a mask (we do not have any that we can provide them), practice social distancing, and we will check their temperatures when they get here.  I did remind the patient that their care partner needs to stay in the parking lot the entire time and have a cell phone available, we will call them when the pt is ready for discharge. Patient will wear mask into building.    Suprep $15 off coupon given to the patient.

## 2019-11-28 NOTE — Therapy (Signed)
[place], [place], [address] Phone: [phone]   Fax:  [phone]  Physical Therapy Treatment/Discharge  Patient Details  Name: Sabrina Mcdaniel MRN: [id number] Date of Birth: 11/13/1965 Referring Provider (PT): Hulan Saas, DO   Encounter Date: 10/31/2019    Past Medical History:  Diagnosis Date  . Anemia    Iron deficiency  . B12 deficiency   . Cholelithiasis   . Depression   . GERD (gastroesophageal reflux disease)   . Hiatal hernia 2014  . Migraines   . Osteopenia   . Thyroid disease    hypo    Past Surgical History:  Procedure Laterality Date  . ABDOMINAL HYSTERECTOMY  2009   anterior and posterior repair  . COLONOSCOPY  2011   hyperplastic polyps  . ESOPHAGOGASTRODUODENOSCOPY ENDOSCOPY  2013  . LASIK  2008  . PLANTAR FASCIA SURGERY Left 2018  . TONSILLECTOMY    . TUBAL LIGATION  1999    There were no vitals filed for this visit.                                 PT Long Term Goals - 10/12/19 1338      PT LONG TERM GOAL #1   Title  Independent with HEP    Baseline  updated today    Time  6    Period  Weeks    Status  Achieved      PT LONG TERM GOAL #2   Title  Improve FOTO outcome measure score to 33% or less impairment    Baseline  45% limited, unable to assess today due to website down at time of tx. session, will reassess at future visit    Time  6    Period  Weeks    Status  On-going    Target Date  11/23/19      PT LONG TERM GOAL #3   Title  Tolerate standing/ambulation periods at least 30-40 minutes for IADLs and community mobility without limitation due to heel pain    Baseline  limited standing and walking tolerance    Time  6    Period  Weeks    Status  On-going    Target Date  11/23/19      PT LONG TERM GOAL #4   Title  Right ankle EV strength 5/5 for improve ankle stability for outdoor ambulation over uneven surfaces to  improve ability for hiking while camping    Baseline  5/5    Time  6    Period  Weeks    Status  Achieved              Patient will benefit from skilled therapeutic intervention in order to improve the following deficits and impairments:  Pain, Decreased strength, Decreased activity tolerance, Difficulty walking  Visit Diagnosis: Pain in joint involving right ankle and foot  Difficulty in walking, not elsewhere classified     Problem List Patient Active Problem List   Diagnosis Date Noted  . Epidermoid cyst of skin 08/06/2019  . Intractable right heel pain 05/09/2019  . Peroneal tendinitis of right lower leg 02/28/2019  . Well woman exam without gynecological exam 07/31/2018  . Vitamin D deficiency 09/01/2017  . Iron deficiency 07/08/2017  . Hypothyroidism 05/27/2017  . Midline cystocele 10/31/2014  . Urethrocele 10/31/2014  . Coccyx pain 10/17/2014  . Proteinuria 10/17/2014  . Rectocele  10/17/2014  . OTHER CONSTIPATION 01/13/2010       PHYSICAL THERAPY DISCHARGE SUMMARY  Visits from Start of Care: 8  Current functional level related to goals / functional outcomes: Patient not seen for therapy since 10/31/19-at the time was noting improved foot pain and plan was to try continuing via HEP and return only if needed. No further therapy planned at this time.   Remaining deficits: NA   Education / Equipment: HEP Plan: Patient agrees to discharge.  Patient goals were not met. Patient is being discharged due to meeting the stated rehab goals.  ?????          Beaulah Dinning, PT, DPT 11/28/19 4:21 PM     [facility] [address] [place], [place], [address] Phone: [phone]   Fax:  [phone]  Name: Sabrina Mcdaniel MRN: [id number] Date of Birth: 1966-06-19

## 2019-12-04 ENCOUNTER — Ambulatory Visit (INDEPENDENT_AMBULATORY_CARE_PROVIDER_SITE_OTHER): Payer: Federal, State, Local not specified - PPO

## 2019-12-04 ENCOUNTER — Other Ambulatory Visit: Payer: Self-pay | Admitting: Gastroenterology

## 2019-12-04 DIAGNOSIS — Z1159 Encounter for screening for other viral diseases: Secondary | ICD-10-CM

## 2019-12-05 LAB — SARS CORONAVIRUS 2 (TAT 6-24 HRS): SARS Coronavirus 2: NEGATIVE

## 2019-12-07 ENCOUNTER — Encounter: Payer: Self-pay | Admitting: Gastroenterology

## 2019-12-07 ENCOUNTER — Ambulatory Visit (AMBULATORY_SURGERY_CENTER): Payer: Federal, State, Local not specified - PPO | Admitting: Gastroenterology

## 2019-12-07 ENCOUNTER — Other Ambulatory Visit: Payer: Self-pay

## 2019-12-07 VITALS — BP 136/78 | HR 70 | Temp 97.3°F | Resp 13 | Ht 63.0 in | Wt 195.0 lb

## 2019-12-07 DIAGNOSIS — R195 Other fecal abnormalities: Secondary | ICD-10-CM | POA: Diagnosis not present

## 2019-12-07 DIAGNOSIS — K635 Polyp of colon: Secondary | ICD-10-CM | POA: Diagnosis not present

## 2019-12-07 DIAGNOSIS — D125 Benign neoplasm of sigmoid colon: Secondary | ICD-10-CM

## 2019-12-07 DIAGNOSIS — D128 Benign neoplasm of rectum: Secondary | ICD-10-CM

## 2019-12-07 DIAGNOSIS — K621 Rectal polyp: Secondary | ICD-10-CM

## 2019-12-07 DIAGNOSIS — D127 Benign neoplasm of rectosigmoid junction: Secondary | ICD-10-CM | POA: Diagnosis not present

## 2019-12-07 MED ORDER — SODIUM CHLORIDE 0.9 % IV SOLN: 500.0000 mL | Freq: Once | INTRAVENOUS | Status: DC

## 2019-12-07 NOTE — Progress Notes (Signed)
PT taken to PACU. Monitors in place. VSS. Report given to RN. 

## 2019-12-07 NOTE — Progress Notes (Signed)
Called to room to assist during endoscopic procedure.  Patient ID and intended procedure confirmed with present staff. Received instructions for my participation in the procedure from the performing physician.  

## 2019-12-07 NOTE — Progress Notes (Signed)
Pt's states no medical or surgical changes since previsit or office visit.  Sabrina Mcdaniel

## 2019-12-07 NOTE — Patient Instructions (Signed)
Handouts on polyps given to you today  °Await pathology results  ° °YOU HAD AN ENDOSCOPIC PROCEDURE TODAY AT THE [facility]:   Refer to the procedure report that was given to you for any specific questions about what was found during the examination.  If the procedure report does not answer your questions, please call your gastroenterologist to clarify.  If you requested that your care partner not be given the details of your procedure findings, then the procedure report has been included in a sealed envelope for you to review at your convenience later. ° °YOU SHOULD EXPECT: Some feelings of bloating in the abdomen. Passage of more gas than usual.  Walking can help get rid of the air that was put into your GI tract during the procedure and reduce the bloating. If you had a lower endoscopy (such as a colonoscopy or flexible sigmoidoscopy) you may notice spotting of blood in your stool or on the toilet paper. If you underwent a bowel prep for your procedure, you may not have a normal bowel movement for a few days. ° °Please Note:  You might notice some irritation and congestion in your nose or some drainage.  This is from the oxygen used during your procedure.  There is no need for concern and it should clear up in a day or so. ° °SYMPTOMS TO REPORT IMMEDIATELY: ° °Following lower endoscopy (colonoscopy or flexible sigmoidoscopy): ° Excessive amounts of blood in the stool ° Significant tenderness or worsening of abdominal pains ° Swelling of the abdomen that is new, acute ° Fever of 100°F or higher ° °For urgent or emergent issues, a gastroenterologist can be reached at any hour by calling [phone]. °Do not use MyChart messaging for urgent concerns.  ° ° °DIET:  We do recommend a small meal at first, but then you may proceed to your regular diet.  Drink plenty of fluids but you should avoid alcoholic beverages for 24 hours. ° °ACTIVITY:  You should plan to take it easy for the rest of today and you  should NOT DRIVE or use heavy machinery until tomorrow (because of the sedation medicines used during the test).   ° °FOLLOW UP: °Our staff will call the number listed on your records 48-72 hours following your procedure to check on you and address any questions or concerns that you may have regarding the information given to you following your procedure. If we do not reach you, we will leave a message.  We will attempt to reach you two times.  During this call, we will ask if you have developed any symptoms of COVID 19. If you develop any symptoms (ie: fever, flu-like symptoms, shortness of breath, cough etc.) before then, please call [phone].  If you test positive for Covid 19 in the 2 weeks post procedure, please call and report this information to us.   ° °If any biopsies were taken you will be contacted by phone or by letter within the next 1-3 weeks.  Please call us at [phone] if you have not heard about the biopsies in 3 weeks.  ° ° °SIGNATURES/CONFIDENTIALITY: °You and/or your care partner have signed paperwork which will be entered into your electronic medical record.  These signatures attest to the fact that that the information above on your After Visit Summary has been reviewed and is understood.  Full responsibility of the confidentiality of this discharge information lies with you and/or your care-partner.  °

## 2019-12-07 NOTE — Op Note (Signed)
[facility] Patient Name: Sabrina Mcdaniel Procedure Date: 12/07/2019 9:33 AM MRN: [id number] Endoscopist: Milus Banister , MD Age: 54 Referring MD:  Date of Birth: 08-06-1966 Gender: Female Account #: [id number] Procedure:                Colonoscopy Indications:              Positive Cologuard test Medicines:                Monitored Anesthesia Care Procedure:                Pre-Anesthesia Assessment:                           - Prior to the procedure, a History and Physical                            was performed, and patient medications and                            allergies were reviewed. The patient's tolerance of                            previous anesthesia was also reviewed. The risks                            and benefits of the procedure and the sedation                            options and risks were discussed with the patient.                            All questions were answered, and informed consent                            was obtained. Prior Anticoagulants: The patient has                            taken no previous anticoagulant or antiplatelet                            agents. ASA Grade Assessment: II - A patient with                            mild systemic disease. After reviewing the risks                            and benefits, the patient was deemed in                            satisfactory condition to undergo the procedure.                           After obtaining informed consent, the colonoscope  was passed under direct vision. Throughout the                            procedure, the patient's blood pressure, pulse, and                            oxygen saturations were monitored continuously. The                            Colonoscope was introduced through the anus and                            advanced to the the cecum, identified by                            appendiceal orifice and ileocecal valve. The                             colonoscopy was performed without difficulty. The                            patient tolerated the procedure well. The quality                            of the bowel preparation was good. The ileocecal                            valve, appendiceal orifice, and rectum were                            photographed. Scope In: 9:40:01 AM Scope Out: 9:52:05 AM Scope Withdrawal Time: 0 hours 7 minutes 43 seconds  Total Procedure Duration: 0 hours 12 minutes 4 seconds  Findings:                 Two sessile polyps were found in the recto-sigmoid                            colon. The polyps were 1 to 2 mm in size. These                            polyps were removed with a cold snare. Resection                            and retrieval were complete.                           The exam was otherwise without abnormality on                            direct and retroflexion views. Complications:            No immediate complications. Estimated blood loss:  None. Estimated Blood Loss:     Estimated blood loss: none. Impression:               - Two 1 to 2 mm polyps at the recto-sigmoid colon,                            removed with a cold snare. Resected and retrieved.                           - The examination was otherwise normal on direct                            and retroflexion views. Recommendation:           - Patient has a contact number available for                            emergencies. The signs and symptoms of potential                            delayed complications were discussed with the                            patient. Return to normal activities tomorrow.                            Written discharge instructions were provided to the                            patient.                           - Resume previous diet.                           - Continue present medications.                           - Await pathology  results. Milus Banister, MD 12/07/2019 9:54:30 AM This report has been signed electronically.

## 2019-12-11 ENCOUNTER — Telehealth: Payer: Self-pay | Admitting: *Deleted

## 2019-12-11 NOTE — Telephone Encounter (Signed)
  Follow up Call-  Call back number 12/07/2019  Post procedure Call Back phone  # [phone]  Permission to leave phone message Yes  Some recent data might be hidden     Patient questions:  Do you have a fever, pain , or abdominal swelling? No. Pain Score  0 *  Have you tolerated food without any problems? Yes.    Have you been able to return to your normal activities? Yes.    Do you have any questions about your discharge instructions: Diet   No. Medications  No. Follow up visit  No.  Do you have questions or concerns about your Care? No.  Actions: * If pain score is 4 or above: No action needed, pain <4.  1. Have you developed a fever since your procedure? no  2.   Have you had an respiratory symptoms (SOB or cough) since your procedure? no  3.   Have you tested positive for COVID 19 since your procedure? no  4.   Have you had any family members/close contacts diagnosed with the COVID 19 since your procedure? no  If yes to any of these questions please route to Joylene John, RN and Alphonsa Gin, Therapist, sports.

## 2019-12-12 ENCOUNTER — Encounter: Payer: Self-pay | Admitting: Gastroenterology

## 2020-01-31 ENCOUNTER — Other Ambulatory Visit: Payer: Self-pay | Admitting: Family Medicine

## 2020-01-31 DIAGNOSIS — Z1231 Encounter for screening mammogram for malignant neoplasm of breast: Secondary | ICD-10-CM

## 2020-02-28 DIAGNOSIS — L821 Other seborrheic keratosis: Secondary | ICD-10-CM | POA: Diagnosis not present

## 2020-02-28 DIAGNOSIS — L738 Other specified follicular disorders: Secondary | ICD-10-CM | POA: Diagnosis not present

## 2020-02-28 DIAGNOSIS — L820 Inflamed seborrheic keratosis: Secondary | ICD-10-CM | POA: Diagnosis not present

## 2020-02-28 DIAGNOSIS — D1801 Hemangioma of skin and subcutaneous tissue: Secondary | ICD-10-CM | POA: Diagnosis not present

## 2020-02-28 DIAGNOSIS — D225 Melanocytic nevi of trunk: Secondary | ICD-10-CM | POA: Diagnosis not present

## 2020-03-19 ENCOUNTER — Other Ambulatory Visit: Payer: Self-pay

## 2020-03-19 ENCOUNTER — Ambulatory Visit
Admission: RE | Admit: 2020-03-19 | Discharge: 2020-03-19 | Disposition: A | Discharge status: Home / Self Care | Payer: Federal, State, Local not specified - PPO | Source: Ambulatory Visit

## 2020-03-19 ENCOUNTER — Other Ambulatory Visit: Payer: Self-pay | Admitting: Family Medicine

## 2020-03-19 DIAGNOSIS — Z1231 Encounter for screening mammogram for malignant neoplasm of breast: Secondary | ICD-10-CM | POA: Diagnosis not present

## 2020-07-15 ENCOUNTER — Other Ambulatory Visit: Payer: Self-pay

## 2020-07-15 ENCOUNTER — Ambulatory Visit: Payer: Federal, State, Local not specified - PPO | Admitting: Podiatry

## 2020-07-15 ENCOUNTER — Encounter: Payer: Self-pay | Admitting: Podiatry

## 2020-07-15 DIAGNOSIS — M722 Plantar fascial fibromatosis: Secondary | ICD-10-CM

## 2020-07-15 MED ORDER — METHYLPREDNISOLONE 4 MG PO TBPK: ORAL_TABLET | ORAL | 0 refills | Status: DC

## 2020-07-15 NOTE — Progress Notes (Signed)
She presents today for follow-up of her right heel states I am still having trouble with this foot again she does particularly heel but am getting a small callused area under the forefoot as well.  Objective: Vital signs stable alert oriented x3 she presents today wearing her tennis shoes with orthotics.  She has pain to palpation medial calcaneal tubercle of the right heel.  He also has some lateral pain running up the peroneal tendons into her right leg.  Plantar aspect of the right foot demonstrates a small porokeratotic lesion plantar aspect of the forefoot right.  At this point it appears to be poor keratoma rather than verrucoid nature.  Assessment: Plantar fasciitis with lateral compensatory syndrome peroneal tendinitis.  Poor keratoma plantar aspect forefoot right.  Plan: Discussed etiology pathology conservative versus surgical therapies.  At this point I started her on methylprednisolone injected her right heel 20 mg of Kenalog 5 mg Marcaine to the point of maximal tenderness.  She tolerated procedure well without complications.  I also debrided the porokeratotic lesion and I will follow-up with her in a month or so.  If she is not improving I would consider MRI at that time.

## 2020-08-06 ENCOUNTER — Other Ambulatory Visit: Payer: Self-pay

## 2020-08-06 ENCOUNTER — Encounter: Payer: Self-pay | Admitting: Family Medicine

## 2020-08-06 ENCOUNTER — Ambulatory Visit (INDEPENDENT_AMBULATORY_CARE_PROVIDER_SITE_OTHER): Payer: Federal, State, Local not specified - PPO | Admitting: Family Medicine

## 2020-08-06 VITALS — BP 115/68 | HR 79 | Temp 97.6°F | Ht 63.0 in | Wt 195.6 lb

## 2020-08-06 DIAGNOSIS — E785 Hyperlipidemia, unspecified: Secondary | ICD-10-CM | POA: Diagnosis not present

## 2020-08-06 DIAGNOSIS — M549 Dorsalgia, unspecified: Secondary | ICD-10-CM

## 2020-08-06 DIAGNOSIS — R109 Unspecified abdominal pain: Secondary | ICD-10-CM | POA: Diagnosis not present

## 2020-08-06 DIAGNOSIS — E039 Hypothyroidism, unspecified: Secondary | ICD-10-CM

## 2020-08-06 MED ORDER — CYCLOBENZAPRINE HCL 5 MG PO TABS: 5.0000 mg | ORAL_TABLET | Freq: Every day | ORAL | 0 refills | Status: DC

## 2020-08-06 NOTE — Patient Instructions (Addendum)
Health Maintenance Due  Topic Date Due  . Hepatitis C Screening  Never done  . COVID-19 Vaccine (1) Never done  . HIV Screening  Never done    Depression screen [facility] 2/9 08/06/2019 07/31/2018  Decreased Interest 0 0  Down, Depressed, Hopeless 0 0  PHQ - 2 Score 0 0  Altered sleeping - 0  Tired, decreased energy - 0  Change in appetite - 0  Feeling bad or failure about yourself  - 0  Trouble concentrating - 0  Moving slowly or fidgety/restless - 0  Suicidal thoughts - 0  PHQ-9 Score - 0   Heating pad 2x/day 15-20 min on then off Stretching exercises after heating pad Take ibuprofen 600mg  (3 200mg  tabs) twice per day with food x 1 week Take flexeril 5mg  1 tab at bedtime x 4-5 nights

## 2020-08-06 NOTE — Progress Notes (Signed)
Sabrina Mcdaniel is a 54 y.o. female  Chief Complaint  Patient presents with   Transitions Of Care    TOC from Dr. Deborra Medina, concerns about dull aches that she has on her right side that radiates to her back.     HPI: Sabrina Mcdaniel is a 54 y.o. female patient previously seen by Dr. Deborra Medina here today for [facility] appt.   Last PAP: s/p TAH Last mammo: 02/2020 Last colonoscopy: 11/2019 - Dr. Ardis Hughs w/ [facility] - due in 2031  Pt complains of few month h/o discomfort/ache in Rt flank, at times can be in RUQ. Symptoms are intermittent.  Pt feels sitting for a long time at work (10-12hrs/day sitting) or in the car seems to trigger it. Laying down seems to help.  She has tried taking advil, use heating pad.  Pt takes nexium for GERD and states symptoms are well-controlled. No n/v/d/c. Appetite is normal.  No urinary symptoms. Pt does not drink much water.  Pt feels like her issue is "with an organ" and like "something is taking up space"   Wt Readings from Last 3 Encounters:  08/06/20 195 lb 9.6 oz (88.7 kg)  12/07/19 195 lb (88.5 kg)  11/21/19 195 lb (88.5 kg)     Past Medical History:  Diagnosis Date   Anemia    Iron deficiency   B12 deficiency    Cholelithiasis    Depression    GERD (gastroesophageal reflux disease)    Hiatal hernia 2014   Migraines    Osteopenia    Thyroid disease    hypo    Past Surgical History:  Procedure Laterality Date   ABDOMINAL HYSTERECTOMY  2009   anterior and posterior repair   COLONOSCOPY  2011   hyperplastic polyps   ESOPHAGOGASTRODUODENOSCOPY ENDOSCOPY  2013   LASIK  2008   PLANTAR FASCIA SURGERY Left 2018   TONSILLECTOMY     TUBAL LIGATION  1999    Social History   Socioeconomic History   Marital status: Married    Spouse name: Not on file   Number of children: Not on file   Years of education: Not on file   Highest education level: Not on file  Occupational History   Not on file  Tobacco Use   Smoking status:  Former Smoker    Quit date: 02/14/1998    Years since quitting: 22.4   Smokeless tobacco: Never Used  Vaping Use   Vaping Use: Never used  Substance and Sexual Activity   Alcohol use: Not Currently   Drug use: No   Sexual activity: Not on file  Other Topics Concern   Not on file  Social History Narrative   Not on file   Social Determinants of Health   Financial Resource Strain:    Difficulty of Paying Living Expenses: Not on file  Food Insecurity:    Worried About Charity fundraiser in the Last Year: Not on file   [employer] of Food in the Last Year: Not on file  Transportation Needs:    Lack of Transportation (Medical): Not on file   Lack of Transportation (Non-Medical): Not on file  Physical Activity:    Days of Exercise per Week: Not on file   Minutes of Exercise per Session: Not on file  Stress:    Feeling of Stress : Not on file  Social Connections:    Frequency of Communication with Friends and Family: Not on file   Frequency of Social Gatherings  with Friends and Family: Not on file   Attends Religious Services: Not on file   Active Member of Clubs or Organizations: Not on file   Attends Archivist Meetings: Not on file   Marital Status: Not on file  Intimate Partner Violence:    Fear of Current or Ex-Partner: Not on file   Emotionally Abused: Not on file   Physically Abused: Not on file   Sexually Abused: Not on file    Family History  Problem Relation Age of Onset   Cancer Mother 67       breast    Breast cancer Mother    Colon cancer Maternal Aunt    Colon polyps Neg Hx    Esophageal cancer Neg Hx    Rectal cancer Neg Hx    Stomach cancer Neg Hx      Immunization History  Administered Date(s) Administered   PFIZER SARS-COV-2 Vaccination 12/09/2019, 01/03/2020   Zoster Recombinat (Shingrix) 09/05/2018, 11/11/2018    Outpatient Encounter Medications as of 08/06/2020  Medication Sig   cyanocobalamin  (,VITAMIN B-12,) 1000 MCG/ML injection Use as directed   esomeprazole (NEXIUM) 20 MG capsule Take 20 mg by mouth daily at 12 noon.   levothyroxine (SYNTHROID) 75 MCG tablet Take 1 tablet by mouth daily.   Prenatal Vit-Fe Fumarate-FA (PRENATAL COMPLETE PO) Take by mouth.   Vitamin D, Ergocalciferol, (DRISDOL) 1.25 MG (50000 UT) CAPS capsule Take 1 capsule (50,000 Units total) by mouth every 7 (seven) days.   cyclobenzaprine (FLEXERIL) 5 MG tablet Take 1 tablet (5 mg total) by mouth at bedtime.   methylPREDNISolone (MEDROL DOSEPAK) 4 MG TBPK tablet 6 day dose pack - take as directed (Patient not taking: Reported on 08/06/2020)   No facility-administered encounter medications on file as of 08/06/2020.     ROS: Gen: no fever, chills  Skin: no rash, itching ENT: no ear pain, ear drainage, nasal congestion, rhinorrhea, sinus pressure, sore throat Eyes: no blurry vision, double vision Resp: no cough, wheeze,SOB Breast: no breast tenderness, no nipple discharge, no breast masses CV: no CP, palpitations, LE edema,  GI: no heartburn, n/v/d/c, abd pain GU: no dysuria, urgency, frequency, hematuria; no vaginal itching, odor, discharge MSK: no joint pain, myalgias, back pain Neuro: no dizziness, headache, weakness, vertigo Psych: no depression, anxiety, insomnia   No Known Allergies  BP 115/68    Pulse 79    Temp 97.6 F (36.4 C) (Tympanic)    Ht 5\' 3"  (1.6 m)    Wt 195 lb 9.6 oz (88.7 kg)    SpO2 96%    BMI 34.65 kg/m   Physical Exam Constitutional:      General: She is not in acute distress.    Appearance: Normal appearance. She is not ill-appearing.  Pulmonary:     Effort: Pulmonary effort is normal.     Breath sounds: Normal breath sounds. No wheezing or rhonchi.  Musculoskeletal:     Lumbar back: Tenderness present. No swelling, deformity, spasms or bony tenderness. Normal range of motion.       Back:     Comments: + TTP over noted area; FROM but pt notes some discomfort  with Rt rotation and sidebending  Neurological:     General: No focal deficit present.     Mental Status: She is alert and oriented to person, place, and time.     Motor: No weakness.     Coordination: Coordination normal.     Gait: Gait normal.  Psychiatric:  Mood and Affect: Mood normal.        Behavior: Behavior normal.      A/P:   1. Musculoskeletal back pain 2. Right flank pain - heating pad 2-3x/day followed by stretching exercises Rx: - cyclobenzaprine (FLEXERIL) 5 MG tablet; Take 1 tablet (5 mg total) by mouth at bedtime.  Dispense: 20 tablet; Refill: 0 - pt to take qHS x 4-5 nights then stop - ibuprofen 600mg  BID w/ food x 7 days then stop - Comprehensive metabolic panel; Future - Lipase; Future - CBC; Future - f/u in 2 wks if no/minimal improvement  3. Hypothyroidism, unspecified type - on levothyroxine 63mcg daily - TSH; Future - T4, free; Future  4. Hyperlipidemia, unspecified hyperlipidemia type - Comprehensive metabolic panel; Future - Lipid panel; Future    This visit occurred during the SARS-CoV-2 public health emergency.  Safety protocols were in place, including screening questions prior to the visit, additional usage of staff PPE, and extensive cleaning of exam room while observing appropriate contact time as indicated for disinfecting solutions.

## 2020-08-07 ENCOUNTER — Other Ambulatory Visit (INDEPENDENT_AMBULATORY_CARE_PROVIDER_SITE_OTHER): Payer: Federal, State, Local not specified - PPO

## 2020-08-07 DIAGNOSIS — R109 Unspecified abdominal pain: Secondary | ICD-10-CM

## 2020-08-07 DIAGNOSIS — E785 Hyperlipidemia, unspecified: Secondary | ICD-10-CM

## 2020-08-07 DIAGNOSIS — E039 Hypothyroidism, unspecified: Secondary | ICD-10-CM

## 2020-08-07 LAB — LIPID PANEL
Cholesterol: 196 mg/dL (ref 0–200)
HDL: 59.2 mg/dL (ref >39.00)
LDL Cholesterol: 116 mg/dL — ABNORMAL HIGH (ref 0–99)
NonHDL: 136.97
Total CHOL/HDL Ratio: 3
Triglycerides: 105 mg/dL (ref 0.0–149.0)
VLDL: 21 mg/dL (ref 0.0–40.0)

## 2020-08-07 LAB — COMPREHENSIVE METABOLIC PANEL
ALT: 40 U/L — ABNORMAL HIGH (ref 0–35)
AST: 24 U/L (ref 0–37)
Albumin: 4.5 g/dL (ref 3.5–5.2)
Alkaline Phosphatase: 107 U/L (ref 39–117)
BUN: 12 mg/dL (ref 6–23)
CO2: 29 mEq/L (ref 19–32)
Calcium: 10 mg/dL (ref 8.4–10.5)
Chloride: 104 mEq/L (ref 96–112)
Creatinine, Ser: 0.84 mg/dL (ref 0.40–1.20)
GFR: 78.08 mL/min (ref >60.00)
Glucose, Bld: 92 mg/dL (ref 70–99)
Potassium: 5 mEq/L (ref 3.5–5.1)
Sodium: 140 mEq/L (ref 135–145)
Total Bilirubin: 0.4 mg/dL (ref 0.2–1.2)
Total Protein: 7.5 g/dL (ref 6.0–8.3)

## 2020-08-07 LAB — CBC
HCT: 42.2 % (ref 36.0–46.0)
Hemoglobin: 13.9 g/dL (ref 12.0–15.0)
MCHC: 32.9 g/dL (ref 30.0–36.0)
MCV: 86.1 fl (ref 78.0–100.0)
Platelets: 263 10*3/uL (ref 150.0–400.0)
RBC: 4.9 Mil/uL (ref 3.87–5.11)
RDW: 13.9 % (ref 11.5–15.5)
WBC: 5.2 10*3/uL (ref 4.0–10.5)

## 2020-08-07 LAB — TSH: TSH: 2.6 u[IU]/mL (ref 0.35–4.50)

## 2020-08-07 LAB — LIPASE: Lipase: 30 U/L (ref 11.0–59.0)

## 2020-08-07 LAB — T4, FREE: Free T4: 1.14 ng/dL (ref 0.60–1.60)

## 2020-08-08 ENCOUNTER — Encounter: Payer: Self-pay | Admitting: Family Medicine

## 2020-08-08 DIAGNOSIS — E039 Hypothyroidism, unspecified: Secondary | ICD-10-CM

## 2020-08-11 MED ORDER — LEVOTHYROXINE SODIUM 75 MCG PO TABS: ORAL_TABLET | ORAL | 3 refills | Status: DC

## 2020-08-11 NOTE — Telephone Encounter (Signed)
Please see message and advise.  Thank you. ° °

## 2020-08-19 MED ORDER — CYANOCOBALAMIN 1000 MCG/ML IJ SOLN
INTRAMUSCULAR | 3 refills | Status: DC
Start: 2020-08-19 — End: 2021-08-26

## 2020-08-19 MED ORDER — VITAMIN D (ERGOCALCIFEROL) 1.25 MG (50000 UNIT) PO CAPS
50000.0000 [IU] | ORAL_CAPSULE | ORAL | 3 refills | Status: DC
Start: 2020-08-19 — End: 2021-08-26

## 2020-08-20 DIAGNOSIS — D485 Neoplasm of uncertain behavior of skin: Secondary | ICD-10-CM | POA: Diagnosis not present

## 2020-08-26 ENCOUNTER — Ambulatory Visit: Payer: Federal, State, Local not specified - PPO | Admitting: Podiatry

## 2020-08-26 ENCOUNTER — Other Ambulatory Visit: Payer: Self-pay

## 2020-08-26 ENCOUNTER — Encounter: Payer: Self-pay | Admitting: Podiatry

## 2020-08-26 DIAGNOSIS — M7671 Peroneal tendinitis, right leg: Secondary | ICD-10-CM | POA: Diagnosis not present

## 2020-08-26 DIAGNOSIS — M722 Plantar fascial fibromatosis: Secondary | ICD-10-CM

## 2020-08-26 NOTE — Progress Notes (Signed)
She presents today for follow-up of her right heel states that is better but she is wondering if it can get any better than what it is.  Objective: Vital signs are stable she is alert and oriented x3.  Pulses are palpable.  She has pain on palpation medial calcaneal tubercle of the right heel.  Assessment: Residual plantar fasciitis right.  Plan: She will follow up with me as needed.

## 2020-09-21 ENCOUNTER — Encounter: Payer: Self-pay | Admitting: Podiatry

## 2020-09-21 DIAGNOSIS — M7671 Peroneal tendinitis, right leg: Secondary | ICD-10-CM

## 2020-09-21 DIAGNOSIS — M722 Plantar fascial fibromatosis: Secondary | ICD-10-CM

## 2020-10-01 ENCOUNTER — Other Ambulatory Visit: Payer: Self-pay

## 2020-10-01 ENCOUNTER — Ambulatory Visit
Admission: RE | Admit: 2020-10-01 | Discharge: 2020-10-01 | Disposition: A | Discharge status: Home / Self Care | Payer: Federal, State, Local not specified - PPO | Source: Ambulatory Visit | Attending: Podiatry | Admitting: Podiatry

## 2020-10-01 DIAGNOSIS — M722 Plantar fascial fibromatosis: Secondary | ICD-10-CM

## 2020-10-01 DIAGNOSIS — M7671 Peroneal tendinitis, right leg: Secondary | ICD-10-CM

## 2020-10-01 DIAGNOSIS — M25571 Pain in right ankle and joints of right foot: Secondary | ICD-10-CM | POA: Diagnosis not present

## 2020-10-01 DIAGNOSIS — M898X7 Other specified disorders of bone, ankle and foot: Secondary | ICD-10-CM | POA: Diagnosis not present

## 2020-10-13 ENCOUNTER — Telehealth: Payer: Self-pay

## 2020-10-13 NOTE — Telephone Encounter (Signed)
Faxed MRI disc and report request to [facility]

## 2020-10-13 NOTE — Telephone Encounter (Signed)
-----   Message from Garrel Ridgel, [place] sent at 10/07/2020  9:26 AM EST ----- Please send for over read and inform patient of the delay.  Let her know that the MRI only shows a small area of arthritis ankle and resolving plantar fasciitis.  We will notify her once the MRI over read comes in.

## 2020-10-13 NOTE — Telephone Encounter (Signed)
-----   Message from Max T Hyatt, DPM sent at 10/07/2020  9:26 AM EST ----- Please send for over read and inform patient of the delay.  Let her know that the MRI only shows a small area of arthritis ankle and resolving plantar fasciitis.  We will notify her once the MRI over read comes in. 

## 2020-10-13 NOTE — Telephone Encounter (Signed)
Patient has been notified of the delay

## 2020-10-17 ENCOUNTER — Telehealth: Payer: Self-pay

## 2020-10-17 NOTE — Telephone Encounter (Signed)
Disc has been mailed to [facility] 

## 2020-10-17 NOTE — Telephone Encounter (Signed)
-----   Message from Max T Hyatt, DPM sent at 10/07/2020  9:26 AM EST ----- Please send for over read and inform patient of the delay.  Let her know that the MRI only shows a small area of arthritis ankle and resolving plantar fasciitis.  We will notify her once the MRI over read comes in. 

## 2020-11-03 DIAGNOSIS — L820 Inflamed seborrheic keratosis: Secondary | ICD-10-CM | POA: Diagnosis not present

## 2020-11-18 ENCOUNTER — Encounter: Payer: Self-pay | Admitting: *Deleted

## 2020-11-25 ENCOUNTER — Ambulatory Visit: Payer: Federal, State, Local not specified - PPO | Admitting: Podiatry

## 2020-11-25 ENCOUNTER — Other Ambulatory Visit: Payer: Self-pay

## 2020-11-25 ENCOUNTER — Encounter: Payer: Self-pay | Admitting: Podiatry

## 2020-11-25 DIAGNOSIS — M722 Plantar fascial fibromatosis: Secondary | ICD-10-CM | POA: Diagnosis not present

## 2020-11-25 NOTE — Progress Notes (Signed)
She presents today for follow-up of her MRI states that she still has pain over here she points to the peroneal tendons of the right foot.  States that the fasciitis is really nonexistent at this point.  Objective: Vital signs are stable alert oriented x3 there is no erythema edema cellulitis drainage or odor.  Still has severe pain on palpation of the peroneal tendons below the lateral malleolus.  MRIs primary in the over read demonstrate no major tears of the peroneals.  Assessment: Peroneal tendinosis.  Plan: I we did discuss that most likely scenario of opening the tendon sheath and evaluating them this fall she is going to try to make it through the summer without having to do anything I will give her an injection occasionally if she needs one otherwise I will see her in the fall.

## 2020-12-12 ENCOUNTER — Encounter: Payer: Self-pay | Admitting: Orthopaedic Surgery

## 2020-12-12 ENCOUNTER — Ambulatory Visit: Payer: Federal, State, Local not specified - PPO | Admitting: Orthopaedic Surgery

## 2020-12-12 DIAGNOSIS — M7671 Peroneal tendinitis, right leg: Secondary | ICD-10-CM | POA: Diagnosis not present

## 2020-12-12 DIAGNOSIS — M79671 Pain in right foot: Secondary | ICD-10-CM

## 2020-12-12 NOTE — Progress Notes (Signed)
Office Visit Note   Patient: Sabrina Mcdaniel           Date of Birth: [birth year]           MRN: [id number] Visit Date: 12/12/2020              Requested by: Ronnald Nian, DO [place],  [place] [id number] PCP: Ronnald Nian, DO   Assessment & Plan: Visit Diagnoses:  1. Peroneal tendinitis of right lower leg   2. Intractable right heel pain     Plan: Overall pain pattern and symptomatology consistent with pathology with possible sural nerve irritation.  Interestingly she had a similar ankle pain in the other foot a few years ago which was caused by vitamin D and calcium deficiency which improved after supplementation.  MRI was essentially normal however peroneal tendon pathology can be missed on MRI due to magic angle effect.  I doubt ankle arthroscopy and treatment of the OCD lesion would alleviate her symptoms.  Based on discussion I think the best course of action is to try another ultrasound-guided peroneal tendon sheath injection by Dr. Junius Roads today.  We will also immobilize an ASO brace.  She will back off on activity to only ADLs.  Recheck in 4 weeks.  Total face to face encounter time was greater than 45 minutes and over half of this time was spent in counseling and/or coordination of care.  Follow-Up Instructions: Return in about 4 weeks (around 01/09/2021).   Orders:  No orders of the defined types were placed in this encounter.  No orders of the defined types were placed in this encounter.     Procedures: No procedures performed   Clinical Data: No additional findings.   Subjective: Chief Complaint  Patient presents with  . Right Ankle - Pain    Sabrina Mcdaniel is a 55 year old female here for second opinion for intractable right heel pain and suspected peroneal tendinitis.  Overall this has been going on since March 2020.  She has seen Dr. Hulan Saas as well as Dr. Milinda Pointer for podiatry during this time.  She has had a few cortisone  injections into the peroneal tendon sheath 1 of which sounds like it was ultrasound-guided by Dr. Tamala Julian.  These injections gave her about a month of relief each time.  She endorses pain to the lateral aspect of the heel and as the day goes on the pain spreads out to the entire heel.  She denies any swelling.  She has done physical therapy without significant relief.  In cam boot made the pain worse.  She only has pain with weightbearing.  She had EMGs which were normal.  After her last visit with Dr. Laureen Ochs he had suggested ankle arthroscopy and exploration of the peroneal tendons.  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   Review of Systems  Constitutional: Negative.   HENT: Negative.   Eyes: Negative.   Respiratory: Negative.   Cardiovascular: Negative.   Endocrine: Negative.   Musculoskeletal: Negative.   Neurological: Negative.   Hematological: Negative.   Psychiatric/Behavioral: Negative.   All other systems reviewed and are negative.    Objective: Vital Signs: There were no vitals taken for this visit.  Physical Exam Vitals and nursing note reviewed.  Constitutional:      Appearance: She is well-developed.  HENT:     Head: Normocephalic and atraumatic.  Pulmonary:     Effort: Pulmonary effort is normal.  Abdominal:     Palpations: Abdomen is soft.  Musculoskeletal:     Cervical back: Neck supple.  Skin:    General: Skin is warm.     Capillary Refill: Capillary refill takes less than 2 seconds.  Neurological:     Mental Status: She is alert and oriented to person, place, and time.  Psychiatric:        Behavior: Behavior normal.        Thought Content: Thought content normal.        Judgment: Judgment normal.     Ortho Exam Right ankle shows no swelling in the posterior lateral region of the ankle.  She has tenderness of the peroneal tendons distal to the fibula.  The tendons are  stable.  She has good strength and sensation and range of motion throughout.  Plantar fascia is nontender.  Achilles is nontender.  Tarsal tunnel is nontender.  She has no tenderness at the medial or lateral ankle gutters.  No pain across the ankle with range of motion. Specialty Comments:  No specialty comments available.  Imaging: No results found.   PMFS History: Patient Active Problem List   Diagnosis Date Noted  . Epidermoid cyst of skin 08/06/2019  . Intractable right heel pain 05/09/2019  . Peroneal tendinitis of right lower leg 02/28/2019  . Well woman exam without gynecological exam 07/31/2018  . Vitamin D deficiency 09/01/2017  . Iron deficiency 07/08/2017  . Hypothyroidism 05/27/2017  . Midline cystocele 10/31/2014  . Urethrocele 10/31/2014  . Coccyx pain 10/17/2014  . Proteinuria 10/17/2014  . Rectocele 10/17/2014  . OTHER CONSTIPATION 01/13/2010   Past Medical History:  Diagnosis Date  . Anemia    Iron deficiency  . B12 deficiency   . Cholelithiasis   . Depression   . GERD (gastroesophageal reflux disease)   . Hiatal hernia 2014  . Migraines   . Osteopenia   . Thyroid disease    hypo    Family History  Problem Relation Age of Onset  . Cancer Mother 45       breast   . Breast cancer Mother   . Colon cancer Maternal Aunt   . Colon polyps Neg Hx   . Esophageal cancer Neg Hx   . Rectal cancer Neg Hx   . Stomach cancer Neg Hx     Past Surgical History:  Procedure Laterality Date  . ABDOMINAL HYSTERECTOMY  2009   anterior and posterior repair  . COLONOSCOPY  2011   hyperplastic polyps  . ESOPHAGOGASTRODUODENOSCOPY ENDOSCOPY  2013  . LASIK  2008  . PLANTAR FASCIA SURGERY Left 2018  . TONSILLECTOMY    . TUBAL LIGATION  1999   Social History   Occupational History  . Not on file  Tobacco Use  . Smoking status: Former Smoker    Quit date: 02/14/1998    Years since quitting: 22.8  . Smokeless tobacco: Never Used  Vaping Use  . Vaping Use: Never  used  Substance and Sexual Activity  . Alcohol use: Not Currently  . Drug use: No  . Sexual activity: Not on file

## 2020-12-22 ENCOUNTER — Ambulatory Visit: Payer: Federal, State, Local not specified - PPO | Admitting: Family Medicine

## 2020-12-22 ENCOUNTER — Ambulatory Visit: Payer: Self-pay

## 2020-12-22 ENCOUNTER — Other Ambulatory Visit: Payer: Self-pay

## 2020-12-22 DIAGNOSIS — M7671 Peroneal tendinitis, right leg: Secondary | ICD-10-CM

## 2020-12-22 NOTE — Progress Notes (Signed)
Subjective: She is here for ultrasound-guided right ankle peroneal tendon sheath injection.  Chronic pain for at least 2 years.  Objective: She is tender to palpation over the peroneal tendons distal and posterior to the lateral malleolus.  Limited diagnostic ultrasound: The peroneal tendons are intact with no sign of tear, no tenosynovitis seen.  At the area of maximal tenderness, there is a bony fragment projecting from the lateral malleolus and appearing to come in direct contact with the peroneal tendon.  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  Plan: Elective not to proceed with injection today but instead I will contact Dr. Erlinda Hong so that he can review the images and get in touch with patient about possible surgical intervention.

## 2020-12-25 ENCOUNTER — Other Ambulatory Visit: Payer: Self-pay

## 2020-12-25 DIAGNOSIS — M7671 Peroneal tendinitis, right leg: Secondary | ICD-10-CM

## 2020-12-25 NOTE — Progress Notes (Signed)
Done

## 2020-12-26 NOTE — Telephone Encounter (Signed)
She is correct.  We don't need another MRI.  Let's just have her come back to see [place].  Thanks.

## 2020-12-26 NOTE — Telephone Encounter (Signed)
5/3 is fine but she is welcome to come in sooner as well.  Thank you.

## 2021-01-15 ENCOUNTER — Ambulatory Visit: Payer: Federal, State, Local not specified - PPO | Admitting: Podiatry

## 2021-01-20 ENCOUNTER — Ambulatory Visit: Payer: Federal, State, Local not specified - PPO | Admitting: Orthopaedic Surgery

## 2021-01-20 ENCOUNTER — Other Ambulatory Visit: Payer: Self-pay

## 2021-01-20 DIAGNOSIS — M79671 Pain in right foot: Secondary | ICD-10-CM

## 2021-01-20 DIAGNOSIS — M7671 Peroneal tendinitis, right leg: Secondary | ICD-10-CM

## 2021-01-20 NOTE — Progress Notes (Signed)
Office Visit Note   Patient: Sabrina Mcdaniel           Date of Birth: 1965/09/30           MRN: [id number] Visit Date: 01/20/2021              Requested by: Ronnald Nian, DO [place],  [place] [id number] PCP: Ronnald Nian, DO   Assessment & Plan: Visit Diagnoses:  1. Peroneal tendinitis of right lower leg   2. Intractable right heel pain     Plan: I am not exactly sure why she continues to have pain despite a negative MRI.  She has had prior injections with temporary relief.  I think at this point exploration of the peroneal tendons would be an option with a foot and ankle surgeon should she be interested in this.  I have offered her referral to multiple foot and ankle surgeons in town but she is just not sure if she wants to do this.  She is very frustrated with ongoing pain.  I am available at any time if she needs a referral to any foot and ankle specialist.  Follow-Up Instructions: Return if symptoms worsen or fail to improve.   Orders:  No orders of the defined types were placed in this encounter.  No orders of the defined types were placed in this encounter.     Procedures: No procedures performed   Clinical Data: No additional findings.   Subjective: Chief Complaint  Patient presents with  . Right Ankle - Follow-up    Sabrina Mcdaniel returns today for follow-up of chronic right ankle pain.  Injection was not performed by Dr. Junius Roads due to ultrasound finding of osteophyte impinging the peroneal tendons.   Review of Systems   Objective: Vital Signs: There were no vitals taken for this visit.  Physical Exam  Ortho Exam Right ankle shows stable peroneal tendons without any subluxation.  She has tenderness along the peroneal tendons.  She experiences pain more on the plantar lateral aspect of the heel with weightbearing. Specialty Comments:  No specialty comments available.  Imaging: No results found.   PMFS History: Patient  Active Problem List   Diagnosis Date Noted  . Epidermoid cyst of skin 08/06/2019  . Intractable right heel pain 05/09/2019  . Peroneal tendinitis of right lower leg 02/28/2019  . Well woman exam without gynecological exam 07/31/2018  . Vitamin D deficiency 09/01/2017  . Iron deficiency 07/08/2017  . Hypothyroidism 05/27/2017  . Midline cystocele 10/31/2014  . Urethrocele 10/31/2014  . Coccyx pain 10/17/2014  . Proteinuria 10/17/2014  . Rectocele 10/17/2014  . OTHER CONSTIPATION 01/13/2010   Past Medical History:  Diagnosis Date  . Anemia    Iron deficiency  . B12 deficiency   . Cholelithiasis   . Depression   . GERD (gastroesophageal reflux disease)   . Hiatal hernia 2014  . Migraines   . Osteopenia   . Thyroid disease    hypo    Family History  Problem Relation Age of Onset  . Cancer Mother 22       breast   . Breast cancer Mother   . Colon cancer Maternal Aunt   . Colon polyps Neg Hx   . Esophageal cancer Neg Hx   . Rectal cancer Neg Hx   . Stomach cancer Neg Hx     Past Surgical History:  Procedure Laterality Date  . ABDOMINAL HYSTERECTOMY  2009   anterior and posterior repair  .  COLONOSCOPY  2011   hyperplastic polyps  . ESOPHAGOGASTRODUODENOSCOPY ENDOSCOPY  2013  . LASIK  2008  . PLANTAR FASCIA SURGERY Left 2018  . TONSILLECTOMY    . TUBAL LIGATION  1999   Social History   Occupational History  . Not on file  Tobacco Use  . Smoking status: Former Smoker    Quit date: 02/14/1998    Years since quitting: 22.9  . Smokeless tobacco: Never Used  Vaping Use  . Vaping Use: Never used  Substance and Sexual Activity  . Alcohol use: Not Currently  . Drug use: No  . Sexual activity: Not on file

## 2021-02-17 ENCOUNTER — Encounter: Payer: Self-pay | Admitting: Podiatry

## 2021-02-17 ENCOUNTER — Ambulatory Visit: Payer: Federal, State, Local not specified - PPO | Admitting: Nurse Practitioner

## 2021-02-17 ENCOUNTER — Ambulatory Visit (INDEPENDENT_AMBULATORY_CARE_PROVIDER_SITE_OTHER): Payer: Federal, State, Local not specified - PPO

## 2021-02-17 ENCOUNTER — Ambulatory Visit: Payer: Federal, State, Local not specified - PPO | Admitting: Podiatry

## 2021-02-17 ENCOUNTER — Encounter: Payer: Self-pay | Admitting: Nurse Practitioner

## 2021-02-17 ENCOUNTER — Other Ambulatory Visit: Payer: Self-pay

## 2021-02-17 VITALS — BP 122/80 | HR 60 | Temp 97.0°F | Wt 193.0 lb

## 2021-02-17 DIAGNOSIS — E162 Hypoglycemia, unspecified: Secondary | ICD-10-CM | POA: Diagnosis not present

## 2021-02-17 DIAGNOSIS — M7671 Peroneal tendinitis, right leg: Secondary | ICD-10-CM

## 2021-02-17 LAB — GLUCOSE, POCT (MANUAL RESULT ENTRY): POC Glucose: 100 mg/dl — AB (ref 70–99)

## 2021-02-17 NOTE — Progress Notes (Signed)
   Subjective:  Patient ID: Sabrina Mcdaniel, female    DOB: 02/21/1966  Age: 55 y.o. MRN: [id number]  CC: Acute Visit (Pt would like her labs done to check A1c, and thyroid. Pt is fasting. )  HPI [name] reports an episode of hypoglycemia 2weeks ago. She experienced lightheadedness and weakness. This was at 2pm and she had skipped breakfast and lunch. Symptoms resolved with eating a snack. She wants to know is she is at risk for DM. She has no FHx of DM.  Wt Readings from Last 3 Encounters:  02/17/21 193 lb (87.5 kg)  08/06/20 195 lb 9.6 oz (88.7 kg)  12/07/19 195 lb (88.5 kg)   Reviewed past Medical, Social and Family history today.  Outpatient Medications Prior to Visit  Medication Sig Dispense Refill  . cyanocobalamin (,VITAMIN B-12,) 1000 MCG/ML injection Use as directed 3 mL 3  . esomeprazole (NEXIUM) 20 MG capsule Take 20 mg by mouth daily at 12 noon.    Marland Kitchen levothyroxine (SYNTHROID) 75 MCG tablet Take 1 tablet by mouth daily. 90 tablet 3  . Prenatal Vit-Fe Fumarate-FA (PRENATAL COMPLETE PO) Take by mouth.    . Vitamin D, Ergocalciferol, (DRISDOL) 1.25 MG (50000 UNIT) CAPS capsule Take 1 capsule (50,000 Units total) by mouth every 7 (seven) days. 12 capsule 3  . cyclobenzaprine (FLEXERIL) 5 MG tablet Take 1 tablet (5 mg total) by mouth at bedtime. (Patient not taking: Reported on 02/17/2021) 20 tablet 0   No facility-administered medications prior to visit.    ROS See HPI  Objective:  BP 122/80 (BP Location: Left Arm, Patient Position: Sitting, Cuff Size: Large)   Pulse 60   Temp (!) 97 F (36.1 C) (Temporal)   Wt 193 lb (87.5 kg)   SpO2 99%   BMI 34.19 kg/m   Physical Exam Constitutional:      Appearance: She is obese.  Cardiovascular:     Rate and Rhythm: Normal rate.     Pulses: Normal pulses.  Pulmonary:     Effort: Pulmonary effort is normal.  Neurological:     Mental Status: She is alert and oriented to person, place, and time.    Assessment & Plan:  This  visit occurred during the SARS-CoV-2 public health emergency.  Safety protocols were in place, including screening questions prior to the visit, additional usage of staff PPE, and extensive cleaning of exam room while observing appropriate contact time as indicated for disinfecting solutions.   [name] was seen today for acute visit.  Diagnoses and all orders for this visit:  Hypoglycemia -     POCT Glucose (CBG)  We discussed symptoms of DM vs hypoglycemia. We also discussed ahow hgbA1c result is interpreter. Since her symptoms were due to skipping meals, there is no need for HgbA1c. We discussed the importance of small frequent meals. She decided for not to complete TSH and T4 labs today. She deferred to her next appt with pcp.  Problem List Items Addressed This Visit   None   Visit Diagnoses    Hypoglycemia    -  Primary   Relevant Orders   POCT Glucose (CBG) (Completed)      Follow-up: No follow-ups on file.  Wilfred Lacy, NP

## 2021-02-17 NOTE — Progress Notes (Signed)
  Subjective:  Patient ID: Sabrina Mcdaniel, female    DOB: March 21, 1966,  MRN: [id number]  Chief Complaint  Patient presents with  . Foot Pain      right ankle pain - hyatt pt, wanted to be seen urgently, new injury Thursday - very painful and swollen. Using a crutch, ice, and elevation. Swelling has come down some    55 y.o. female presents with the above complaint. History confirmed with patient.  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  She had an MRI in January.  Something or worse last week and felt a twinge of pain.  Has been able to put weight on it with some difficulty.  Did not fall or have a gross traumatic event  Objective:  Physical Exam: warm, good capillary refill, no trophic changes or ulcerative lesions, normal DP and PT pulses and normal sensory exam.  Right Foot: Sharp pain on palpation to peroneal tendons in the retromalleolar groove and just distal.  Good no gross instability or peroneal dislocation.  Minimal to no pain on the lateral ankle ligaments or anterior joint line.  Radiographs: X-ray of the right foo and ankle : no fracture, dislocation, swelling or degenerative changes noted Assessment:   1. Peroneal tendinitis of right lower leg      Plan:  Patient was evaluated and treated and all questions answered.  Reviewed the clinical and radiographic findings with her.  Also discussed the results of her last MRI with her.  Did not find any evidence of gross instability or joint pathology that would warrant another MRI.  I think likely the tendinosis has evolved into split tearing which we had expected.  She is interested in surgical correction.  She will be planning this with Dr. Milinda Pointer at his next visit.  Discussed the possible risk and complications of this type of surgery and the postoperative course including the period of casting nonweightbearing and rehabilitation.  She will discuss in  further detail with Dr. Milinda Pointer.  No follow-ups on file.

## 2021-02-25 DIAGNOSIS — L918 Other hypertrophic disorders of the skin: Secondary | ICD-10-CM | POA: Diagnosis not present

## 2021-02-25 DIAGNOSIS — D225 Melanocytic nevi of trunk: Secondary | ICD-10-CM | POA: Diagnosis not present

## 2021-02-25 DIAGNOSIS — L821 Other seborrheic keratosis: Secondary | ICD-10-CM | POA: Diagnosis not present

## 2021-02-25 DIAGNOSIS — L738 Other specified follicular disorders: Secondary | ICD-10-CM | POA: Diagnosis not present

## 2021-02-26 ENCOUNTER — Ambulatory Visit: Payer: Federal, State, Local not specified - PPO | Admitting: Podiatry

## 2021-03-05 ENCOUNTER — Encounter: Payer: Self-pay | Admitting: Podiatry

## 2021-03-05 ENCOUNTER — Ambulatory Visit: Payer: Federal, State, Local not specified - PPO | Admitting: Podiatry

## 2021-03-05 ENCOUNTER — Other Ambulatory Visit: Payer: Self-pay

## 2021-03-05 DIAGNOSIS — M7671 Peroneal tendinitis, right leg: Secondary | ICD-10-CM

## 2021-03-05 DIAGNOSIS — M722 Plantar fascial fibromatosis: Secondary | ICD-10-CM

## 2021-03-07 NOTE — Progress Notes (Signed)
She presents today for follow-up of her peroneal tendinitis of her right leg states that it is starting to radiate all the way up the leg now.  She states think I am about ready to have surgery on this is becoming more more painful and starting to affect my ability perform her daily activities.  She discussed surgery with Dr. Sherryle Lis the last time she was in.  Objective: Vital signs are stable she is alert and oriented x3 I reviewed her past medical history medications allergies surgery social history.  Pulses are strongly palpable right.  She has moderate to severe pain on palpation medial calcaneal tubercle of the right heel as well as on palpation of the peroneal tendons.  Concern for tears of these tendons per MRI.  Mildly edematous today no erythema cellulitis drainage or odor.  A prominent peroneal tubercle is also noted.  Assessment: Peroneal tendinitis [place] fasciitis possible tear of the peroneal tendons.  Plan: Discussed etiology pathology conservative versus surgical therapies at this point we consented her today for repair of the peroneal tendons as well as a endoscopic plantar fasciotomy in a cast.  She understands this and is amenable to it and I will follow-up with her in the near future for surgical intervention.  We did discuss in great detail today possible postop complications which may include but not limited to postop pain bleeding swelling infection need for further surgery overcorrection under correction loss of digit loss of limb loss of life.  He understands that she could have scar tissue that may result in numbness in her toes side of her foot or scar down the tendon itself.  She was given information regarding the surgery center anesthesia group.

## 2021-03-10 DIAGNOSIS — M79676 Pain in unspecified toe(s): Secondary | ICD-10-CM

## 2021-03-11 ENCOUNTER — Telehealth: Payer: Self-pay | Admitting: Urology

## 2021-03-11 NOTE — Telephone Encounter (Signed)
DOS - 03/27/21   EPF RIGHT - [id number] REPAIR PERONEAL TENDON RIGHT --- [id number]   [employer] EFFECTIVE DATE - 09/29/02   PLAN DEDUCTIBLE - $0.00 OUT OF POCKET - $6,500.00 W/ $6,050.00 REMAINING  COINSURANCE - 0%  COPAY - $0.00   NO PRIOR AUTH REQUIRED

## 2021-03-12 DIAGNOSIS — M79676 Pain in unspecified toe(s): Secondary | ICD-10-CM

## 2021-03-16 DIAGNOSIS — M546 Pain in thoracic spine: Secondary | ICD-10-CM | POA: Diagnosis not present

## 2021-03-16 DIAGNOSIS — M7912 Myalgia of auxiliary muscles, head and neck: Secondary | ICD-10-CM | POA: Diagnosis not present

## 2021-03-16 DIAGNOSIS — M5413 Radiculopathy, cervicothoracic region: Secondary | ICD-10-CM | POA: Diagnosis not present

## 2021-03-16 DIAGNOSIS — M9901 Segmental and somatic dysfunction of cervical region: Secondary | ICD-10-CM | POA: Diagnosis not present

## 2021-03-25 DIAGNOSIS — M5413 Radiculopathy, cervicothoracic region: Secondary | ICD-10-CM | POA: Diagnosis not present

## 2021-03-25 DIAGNOSIS — M9901 Segmental and somatic dysfunction of cervical region: Secondary | ICD-10-CM | POA: Diagnosis not present

## 2021-03-25 DIAGNOSIS — M546 Pain in thoracic spine: Secondary | ICD-10-CM | POA: Diagnosis not present

## 2021-03-25 DIAGNOSIS — M7912 Myalgia of auxiliary muscles, head and neck: Secondary | ICD-10-CM | POA: Diagnosis not present

## 2021-03-26 ENCOUNTER — Other Ambulatory Visit: Payer: Self-pay | Admitting: Podiatry

## 2021-03-26 MED ORDER — ONDANSETRON HCL 4 MG PO TABS: 4.0000 mg | ORAL_TABLET | Freq: Three times a day (TID) | ORAL | 0 refills | Status: DC | PRN

## 2021-03-26 MED ORDER — OXYCODONE-ACETAMINOPHEN 10-325 MG PO TABS: 1.0000 | ORAL_TABLET | Freq: Three times a day (TID) | ORAL | 0 refills | Status: AC | PRN

## 2021-03-26 MED ORDER — CEPHALEXIN 500 MG PO CAPS: 500.0000 mg | ORAL_CAPSULE | Freq: Three times a day (TID) | ORAL | 0 refills | Status: DC

## 2021-03-27 DIAGNOSIS — S86311A Strain of muscle(s) and tendon(s) of peroneal muscle group at lower leg level, right leg, initial encounter: Secondary | ICD-10-CM | POA: Diagnosis not present

## 2021-03-27 DIAGNOSIS — M25571 Pain in right ankle and joints of right foot: Secondary | ICD-10-CM | POA: Diagnosis not present

## 2021-03-27 DIAGNOSIS — M722 Plantar fascial fibromatosis: Secondary | ICD-10-CM | POA: Diagnosis not present

## 2021-03-27 DIAGNOSIS — S86312A Strain of muscle(s) and tendon(s) of peroneal muscle group at lower leg level, left leg, initial encounter: Secondary | ICD-10-CM | POA: Diagnosis not present

## 2021-03-27 DIAGNOSIS — M7671 Peroneal tendinitis, right leg: Secondary | ICD-10-CM | POA: Diagnosis not present

## 2021-03-27 HISTORY — PX: REPAIR PERONEAL TENDONS ANKLE: SUR1201

## 2021-04-02 ENCOUNTER — Other Ambulatory Visit: Payer: Self-pay

## 2021-04-02 ENCOUNTER — Ambulatory Visit (INDEPENDENT_AMBULATORY_CARE_PROVIDER_SITE_OTHER): Payer: Federal, State, Local not specified - PPO | Admitting: Podiatry

## 2021-04-02 VITALS — BP 148/81 | HR 87 | Temp 98.1°F

## 2021-04-02 DIAGNOSIS — Z9889 Other specified postprocedural states: Secondary | ICD-10-CM

## 2021-04-02 DIAGNOSIS — M7671 Peroneal tendinitis, right leg: Secondary | ICD-10-CM | POA: Diagnosis not present

## 2021-04-02 DIAGNOSIS — M722 Plantar fascial fibromatosis: Secondary | ICD-10-CM

## 2021-04-06 ENCOUNTER — Ambulatory Visit (INDEPENDENT_AMBULATORY_CARE_PROVIDER_SITE_OTHER): Payer: Federal, State, Local not specified - PPO | Admitting: Podiatry

## 2021-04-06 ENCOUNTER — Telehealth: Payer: Self-pay | Admitting: *Deleted

## 2021-04-06 ENCOUNTER — Other Ambulatory Visit: Payer: Self-pay

## 2021-04-06 DIAGNOSIS — M7671 Peroneal tendinitis, right leg: Secondary | ICD-10-CM | POA: Diagnosis not present

## 2021-04-06 DIAGNOSIS — Z4789 Encounter for other orthopedic aftercare: Secondary | ICD-10-CM

## 2021-04-06 NOTE — Telephone Encounter (Signed)
Returned call to patient and she is coming in today to have cast changed,trying to get transportation now.

## 2021-04-06 NOTE — Telephone Encounter (Signed)
Patient is calling because the cast that was put on Thurday seems to be loose,can move ankle in it , hard to find a spot that does not cause pain. She has a f/u appointment on this week, should she wait, advance notice before coming in will need a driver to bring her. Please advise.

## 2021-04-06 NOTE — Telephone Encounter (Signed)
Patient calling to confirm that she will make it for cast change today.

## 2021-04-07 ENCOUNTER — Telehealth: Payer: Self-pay | Admitting: *Deleted

## 2021-04-07 NOTE — Progress Notes (Signed)
Subjective: Sabrina Mcdaniel is a 55 y.o. is seen today in office s/p right foot EPF, peroneal tendon repair preformed on March 27, 2021.  She states her pain is currently controlled.  Her cast is loose.  Denies any systemic complaints such as fevers, chills, nausea, vomiting. No calf pain, chest pain, shortness of breath.   Objective: General: No acute distress, AAOx3  DP/PT pulses palpable 2/4, CRT < 3 sec to all digits.  Protective sensation intact. Motor function intact.  Right foot: Incision is well coapted without any evidence of dehiscence with sutures intact. There is no surrounding erythema, ascending cellulitis, fluctuance, crepitus, malodor, drainage/purulence. There is minimal edema around the surgical site. There is no significant pain along the surgical site.  No other areas of tenderness to bilateral lower extremities.  No other open lesions or pre-ulcerative lesions.  No pain with calf compression, swelling, warmth, erythema.   Assessment and Plan:  Status post right peroneal tendon repair, EPF, doing well with no complications   -Treatment options discussed including all alternatives, risks, and complications -Incisions are healing well.  A small amount of antibiotic ointment was applied followed by dressing. -A well-padded below-knee fiberglass cast was applied making sure to pad all bony prominences. -Continue nonweightbearing -Ice/elevation -Pain medication as needed. -Monitor for any clinical signs or symptoms of infection and DVT/PE and directed to call the office immediately should any occur or go to the ER. -Follow-up as scheduled with Dr. Milinda Pointer or sooner if any problems arise. In the meantime, encouraged to call the office with any questions, concerns, change in symptoms.   Celesta Gentile, DPM

## 2021-04-07 NOTE — Telephone Encounter (Signed)
Patient came in and was seen by Dr Sherryle Lis 04/06/21.

## 2021-04-08 NOTE — Progress Notes (Signed)
Patient presents for a painful cast.  I remove the cast inspected her incision there is no evidence of wound complication, infection or deep tissue injury.  A new well-padded below the knee cast was applied.  Said that it felt comfortable and she will see Dr. Milinda Pointer this week for follow-up.

## 2021-04-09 ENCOUNTER — Other Ambulatory Visit: Payer: Self-pay

## 2021-04-09 ENCOUNTER — Ambulatory Visit (INDEPENDENT_AMBULATORY_CARE_PROVIDER_SITE_OTHER): Payer: Federal, State, Local not specified - PPO | Admitting: Podiatry

## 2021-04-09 VITALS — BP 116/62 | HR 61 | Temp 97.8°F

## 2021-04-09 DIAGNOSIS — M722 Plantar fascial fibromatosis: Secondary | ICD-10-CM

## 2021-04-09 DIAGNOSIS — M7671 Peroneal tendinitis, right leg: Secondary | ICD-10-CM

## 2021-04-09 DIAGNOSIS — Z9889 Other specified postprocedural states: Secondary | ICD-10-CM

## 2021-04-11 NOTE — Progress Notes (Signed)
She presents today for postop visit #2 she was seen by Dr.McDonald just this past Monday for a painful loose cast.  He took the cast off and applied another when she states that is very comfortable she has had no pain really and she is scheduled to see [place] August 4 for cast removal.  Objective: Vital signs are stable alert oriented x3 blood pressure 116/62 pulse of 61 temperature is 97.8.  She has loosening of the proximal end of the cast.  Her toes are within the cast but freely movable she also has good sensation to the toes.  The cast looks like is in appropriate position in relation to the common peroneal nerve.  But otherwise she states that she is feeling much better currently than she was prior to surgery.  Assessment: Well-healing surgical foot and ankle.  Plan: I would like to follow-up with her on the fourth for cast change.

## 2021-04-20 ENCOUNTER — Encounter: Payer: Self-pay | Admitting: Podiatry

## 2021-04-23 ENCOUNTER — Other Ambulatory Visit: Payer: Self-pay

## 2021-04-23 ENCOUNTER — Ambulatory Visit (INDEPENDENT_AMBULATORY_CARE_PROVIDER_SITE_OTHER): Payer: Federal, State, Local not specified - PPO | Admitting: Podiatry

## 2021-04-23 ENCOUNTER — Encounter: Payer: Self-pay | Admitting: Podiatry

## 2021-04-23 DIAGNOSIS — Z9889 Other specified postprocedural states: Secondary | ICD-10-CM

## 2021-04-23 DIAGNOSIS — M7671 Peroneal tendinitis, right leg: Secondary | ICD-10-CM | POA: Diagnosis not present

## 2021-04-23 DIAGNOSIS — M722 Plantar fascial fibromatosis: Secondary | ICD-10-CM

## 2021-04-26 NOTE — Progress Notes (Signed)
She presents today for follow-up of her peroneal tendon redo and a total endoscopic fasciotomy with cast.  She states that she has been doing pretty well not a whole lot of soreness or tenderness.  Denies fever chills nausea vomiting muscle aches pains calf pain back pain chest pain shortness of breath.  Objective: Cast intact was removed demonstrates no erythema edema cellulitis drainage or odor.  Sutures were removed staples were removed margins remain well coapted no signs of infection at all she has good range of motion dorsiflexion plantarflexion inversion and eversion.  Much less swelling than I have seen in his foot over the past year or so.  Assessment: Well-healing surgical foot.  Plan: Patient a compression anklet today started her back on a cam walker she will remain nonweightbearing for the next 2 weeks in a cam walker then we will start partial weightbearing at week 6.

## 2021-05-05 ENCOUNTER — Encounter: Payer: Self-pay | Admitting: Podiatry

## 2021-05-05 ENCOUNTER — Ambulatory Visit (INDEPENDENT_AMBULATORY_CARE_PROVIDER_SITE_OTHER): Payer: Federal, State, Local not specified - PPO | Admitting: Podiatry

## 2021-05-05 ENCOUNTER — Other Ambulatory Visit: Payer: Self-pay

## 2021-05-05 DIAGNOSIS — M722 Plantar fascial fibromatosis: Secondary | ICD-10-CM

## 2021-05-05 DIAGNOSIS — M7671 Peroneal tendinitis, right leg: Secondary | ICD-10-CM

## 2021-05-05 DIAGNOSIS — Z9889 Other specified postprocedural states: Secondary | ICD-10-CM

## 2021-05-06 NOTE — Progress Notes (Addendum)
Sabrina Mcdaniel presents today for follow-up postop visit date of surgery 03/27/2021 peroneal tendon repair and cast application with endoscopic fasciotomy.  She states that she is really doing pretty well presents today with her foot elevated in a wheelchair in her cam walker in place.  She denies fever chills nausea vomiting muscle aches pains calf pain back pain chest pain shortness of breath.  States that it feels better is still puffy in the toes and in the heel.  Objective: Vital signs are stable alert oriented x3.  Pulses are palpable.  There is no erythema edema cellulitis drainage or odor incision site appears to be healing very nicely all incision sites today.  She has some scar tissue around the beneath the incision line which is scarred type to the deeper structures.  She has good plantar flexion abduction and eversion.  All tendons appear to be intact and are not overly painful.  Assessment: Very happy with her outcome at this point.  Plan: At this point I am going to encourage massage therapy to help break down scar and encouraged range of motion activities and simple physical therapy while at home such as moving marbles on the floor or towels on the floor she understands this and is amenable to it we did discuss this in detail.:  Also asked that she start partial weightbearing for the next 2weeks with her cam walker on and then we will start full weightbearing for 2 more weeks and progress to tennis shoes within the month I will follow-up with her in 2 weeks to make sure she is doing well any setbacks she will notify us immediately.  I do not expect this patient to be able to return to work until at least October 3.  Most likely we will not have her walking and through physical therapy until that time.  She will remain out of work until further notice.

## 2021-05-07 ENCOUNTER — Encounter: Payer: Federal, State, Local not specified - PPO | Admitting: Podiatry

## 2021-05-11 ENCOUNTER — Ambulatory Visit: Payer: Federal, State, Local not specified - PPO | Admitting: Family Medicine

## 2021-05-15 ENCOUNTER — Ambulatory Visit: Payer: Federal, State, Local not specified - PPO | Admitting: Family Medicine

## 2021-05-21 ENCOUNTER — Other Ambulatory Visit: Payer: Self-pay

## 2021-05-21 ENCOUNTER — Ambulatory Visit (INDEPENDENT_AMBULATORY_CARE_PROVIDER_SITE_OTHER): Payer: Federal, State, Local not specified - PPO | Admitting: Podiatry

## 2021-05-21 DIAGNOSIS — M722 Plantar fascial fibromatosis: Secondary | ICD-10-CM

## 2021-05-21 DIAGNOSIS — M7671 Peroneal tendinitis, right leg: Secondary | ICD-10-CM

## 2021-05-21 DIAGNOSIS — Z9889 Other specified postprocedural states: Secondary | ICD-10-CM

## 2021-05-23 NOTE — Progress Notes (Addendum)
She presents today date of surgery 03/27/2021 using crutches and partial weightbearing. Patient states that since she was here last visit things have been going fine she is now  partial weightbearing and still swells a little bit but there is no pain anymore.  She denies fever chills nausea vomiting muscle aches and pains.  Objective: There is mild edema but no pain on palpation of the surgical site along the peroneal tendon and the fasciotomy site.  She no longer has the allodynia type pain along that lateral incision. Muscle strength however, 3/5.  Assessment: Well-healing surgical foot right. Still ambulating with crutches.  Plan: Placed in a Tri-Lock brace with a tennis shoe.  She understands that she may have to go back and forth between the Tri-Lock brace and her cam boot.  She is not to be barefoot at any time.  I will follow-up with her in about 2 weeks to make sure she is doing well.  Hopefully at that point we will progress to tennis shoe without the brace.  May need to consider aggressive physical therapy. She is not to return to work yet until I am satisfied she will be able to perform her job without injury to the surgical foot.  I will most likely schedule her for PT at the next visit if her muscle strength has not improved.

## 2021-06-09 ENCOUNTER — Telehealth: Payer: Self-pay | Admitting: Podiatry

## 2021-06-09 NOTE — Telephone Encounter (Signed)
Sabrina Mcdaniel is still out of work, her claim was denied due to office note. [employer] company needs more detail on why the patient was unable to return to work. She is scheduled to come in on 06/11/2021, but it you can please amend her last note, with more detail. Sabrina Mcdaniel indicated that she was still using crutches at that time.

## 2021-06-11 ENCOUNTER — Other Ambulatory Visit: Payer: Self-pay

## 2021-06-11 ENCOUNTER — Encounter: Payer: Self-pay | Admitting: Podiatry

## 2021-06-11 ENCOUNTER — Ambulatory Visit (INDEPENDENT_AMBULATORY_CARE_PROVIDER_SITE_OTHER): Payer: Federal, State, Local not specified - PPO | Admitting: Podiatry

## 2021-06-11 DIAGNOSIS — Z9889 Other specified postprocedural states: Secondary | ICD-10-CM

## 2021-06-11 DIAGNOSIS — M7671 Peroneal tendinitis, right leg: Secondary | ICD-10-CM

## 2021-06-11 DIAGNOSIS — M722 Plantar fascial fibromatosis: Secondary | ICD-10-CM

## 2021-06-11 NOTE — Progress Notes (Signed)
She presents today date of surgery is 03/27/2021 right foot endoscopic fasciotomy with repair of her peroneal tendon.  She denies fever chills nausea vomiting muscle aches and pains.  Presents today utilizing her single crutch with her ankle stabilizer right side.  States that she has been doing pretty well although it swells in the evening.  She tries to go without the crutch during the day but by evening she needs to crutch.  States that she can walk without the brace on in the house with shoes.  States that she still has a considerable discomfort in the foot over the incision as well as at nighttime she has to take Advil to help alleviate the symptoms.  She does complain of balance and weakness.  She states that she feels like she is walking with a peg leg.  Objective: Vital signs are stable she alert oriented x3 much decrease in edema unable to palpate the tendon itself.  She has good inversion and abduction against resistance.  Moderately tender overlying the incision site allodynic type pain.  She has no pain on palpation of the plantar fascia at the fasciotomy site.  Muscle strength is moderately weak.  Assessment well-healing surgical foot obvious weakness and balance pain and fatigue is present.  Plan: Discussed etiology pathology conservative versus surgical therapies at this point I am going encouraged her to try to get rid of the crutch.  She may continue to use the ankle brace when outside of her home.  I feel they were going to have to send her to aggressive physical therapy to regain muscle strength balance and a normal gait at this point her gait is abnormal and will affect other parts of her body if we will take care of that.  We also need balance training and we also need to alleviate the allodynia along the lateral aspect of that foot.  I will follow-up with her once her physical therapy is complete and currently I feel it would be a detriment for her to return to work.

## 2021-06-15 ENCOUNTER — Telehealth: Payer: Self-pay | Admitting: Podiatry

## 2021-06-15 NOTE — Telephone Encounter (Signed)
Dr. Milinda Pointer, Thank you for amending the office note. The Disability company is now saying on that note they need to know why the patient is unable to come in, since she's in a seated position. Mrs. Flitton said she was still out due to increased swelling, pain and edema.

## 2021-06-25 ENCOUNTER — Ambulatory Visit
Payer: Federal, State, Local not specified - PPO | Attending: Podiatry | Admitting: Rehabilitative and Restorative Service Providers"

## 2021-06-25 ENCOUNTER — Encounter: Payer: Self-pay | Admitting: Rehabilitative and Restorative Service Providers"

## 2021-06-25 ENCOUNTER — Other Ambulatory Visit: Payer: Self-pay

## 2021-06-25 DIAGNOSIS — M25671 Stiffness of right ankle, not elsewhere classified: Secondary | ICD-10-CM | POA: Insufficient documentation

## 2021-06-25 DIAGNOSIS — R262 Difficulty in walking, not elsewhere classified: Secondary | ICD-10-CM | POA: Insufficient documentation

## 2021-06-25 DIAGNOSIS — R600 Localized edema: Secondary | ICD-10-CM | POA: Insufficient documentation

## 2021-06-25 DIAGNOSIS — M6281 Muscle weakness (generalized): Secondary | ICD-10-CM | POA: Diagnosis not present

## 2021-06-25 DIAGNOSIS — M25571 Pain in right ankle and joints of right foot: Secondary | ICD-10-CM | POA: Diagnosis not present

## 2021-06-25 NOTE — Therapy (Signed)
[place] [place], [place], [address] Phone: [phone]   Fax:  [phone]  Physical Therapy Evaluation  Patient Details  Name: Sabrina Mcdaniel MRN: [id number] Date of Birth: [birth year] Referring Provider (PT): Dr Tyson Dense   Encounter Date: 06/25/2021   PT End of Session - 06/25/21 1257     Visit Number 1    Date for PT Re-Evaluation 08/14/21    PT Start Time 1217    PT Stop Time 1300    PT Time Calculation (min) 43 min    Activity Tolerance Patient tolerated treatment well    Behavior During Therapy [facility] for tasks assessed/performed             Past Medical History:  Diagnosis Date   Anemia    Iron deficiency   B12 deficiency    Cholelithiasis    Depression    GERD (gastroesophageal reflux disease)    Hiatal hernia 2014   Migraines    Osteopenia    Thyroid disease    hypo    Past Surgical History:  Procedure Laterality Date   ABDOMINAL HYSTERECTOMY  2009   anterior and posterior repair   COLONOSCOPY  2011   hyperplastic polyps   ESOPHAGOGASTRODUODENOSCOPY ENDOSCOPY  2013   LASIK  2008   PLANTAR FASCIA SURGERY Left 2018   TONSILLECTOMY     TUBAL LIGATION  1999    There were no vitals filed for this visit.    Subjective Assessment - 06/25/21 1223     Subjective Pt reports that after years of overuse and having pain, she underwent surgery on 03/27/21 for plantar fasciitis and tear in peroneal tendon.  She states that she has been recovering, but is still unable to negotiate steps and still having difficulty with other activites and decreased ROM with increased pain.    Pertinent History R foot plantar fasciitis    Limitations Standing;Walking    How long can you stand comfortably? has not noticed difficulty    How long can you walk comfortably? 30 min    Patient Stated Goals I want to be a strong walker to be able to hike again.    Currently in Pain? Yes    Pain Score 5     Pain  Location Foot    Pain Orientation Right    Pain Descriptors / Indicators Aching;Tightness    Pain Type Surgical pain    Pain Onset More than a month ago    Pain Frequency Constant                [facility] PT Assessment - 06/25/21 0001       Assessment   Medical Diagnosis Surgery 03/27/21 R foot fasciotomy with repair of peroneal tendon.    Referring Provider (PT) Dr Tyson Dense    Onset Date/Surgical Date 03/27/21    Hand Dominance Right    Next MD Visit after completion of PT    Prior Therapy no      Precautions   Precautions None      Restrictions   Weight Bearing Restrictions No      Balance Screen   Has the patient fallen in the past 6 months Yes    How many times? 1   fell in shower shortly after surgery   Has the patient had a decrease in activity level because of a fear of falling?  No    Is the patient reluctant to leave their home  because of a fear of falling?  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     Observation/Other Assessments-Edema    Edema Figure 8      Figure 8 Edema   Figure 8 - Right  48 cm    Figure 8 - Left  50.5 cm      ROM / Strength   AROM / PROM / Strength AROM;Strength      AROM   AROM Assessment Site Ankle    Right/Left Ankle Right;Left    Right Ankle Dorsiflexion 3    Right Ankle Plantar Flexion 65     Right Ankle Inversion 40    Right Ankle Eversion 23    Left Ankle Dorsiflexion 0    Left Ankle Plantar Flexion 50    Left Ankle Inversion 27    Left Ankle Eversion 15      Strength   Overall Strength Comments R foot inversion and eversion 4-/5.  R PF and DF is 4/5.                        Objective measurements completed on examination: See above findings.       [place] Adult PT Treatment/Exercise - 06/25/21 0001       Exercises   Exercises Ankle      Ankle Exercises: Aerobic   Recumbent Bike L1.5 x5 min      Ankle Exercises: Seated   Other Seated Ankle Exercises Yellow tband for PF, DF, inv, evr R ankle x10 each                     PT Education - 06/25/21 1255     Education Details Pt provided with HEP.    Person(s) Educated Patient    Methods Explanation;Demonstration;Handout    Comprehension Verbalized understanding;Returned demonstration              PT Short Term Goals - 06/25/21 1308       PT SHORT TERM GOAL #1   Title Pt will be independent with initial HEP.    Time 2    Period Weeks    Status New               PT Long Term Goals - 06/25/21 1309       PT LONG TERM GOAL #1   Title Pt will be independent with advanced HEP.    Time 8    Period Weeks    Status New      PT LONG TERM GOAL #2   Title Increase FOTO to 70% to allow pt to be more independent with hobbies.    Time 8    Period  Weeks    Status New      PT LONG TERM GOAL #3   Title Pt will be able to return to shorter hiking trails without an increase of R foot pain.    Time 8    Period Weeks    Status New      PT LONG TERM GOAL #4   Title Increase R foot strength to 4+/5 to allow pt to negotiate steps..    Time 8    Period Weeks      PT LONG TERM GOAL #5   Title Pt will be able to negotiate a flight of stairs with reciprocal gait pattern without increased pain to allow her to do laundry in her basement.    Time 8    Period Weeks    Status New                     Plan - 06/25/21 1303     Clinical Impression Statement Pt is a 55 y.o. female who presents to outpatient PT with a referral from Dr Tyson Dense s/p R foot fasciotomy with repair of peroneal tendon on 03/27/21. Pts PLOF is able to go on hikes and camping without pain. She presents with increased pain, decreased ROM, decreased strength and decreased balance.  She is able to hold RLE single leg stance for 8 seconds and has difficulty with navigating stairs and with quick turns.  She admits that she has started having some pain in her L knee, but feels that this may be secondary to gait deviations and increased use of LLE secondary to RLE impairments.  Pt would benefit from skilled PT to address her functional impairments to allow her to return to her active lifestyle independently and without pain.    Examination-Participation Restrictions Yard Work    Stability/Clinical Decision Making Stable/Uncomplicated    Clinical Decision Making Low    Rehab Potential Good    PT Frequency 2x / week    PT Duration 8 weeks    PT Treatment/Interventions ADLs/Self Care Home Management;Cryotherapy;Electrical Stimulation;Iontophoresis 4mg /ml Dexamethasone;Moist Heat;Ultrasound;Gait training;Stair training;Functional mobility training;Therapeutic activities;Therapeutic exercise;Balance training;Patient/family education;Manual techniques;Scar mobilization;Passive range of motion;Dry needling;Taping;Vasopneumatic Device;Joint Manipulations    PT Next Visit Plan assess and progress HEP.  strengthening, ROM, modalities as indicated    PT Home Exercise Plan Access Code [email or web address]    Consulted and Agree with Plan of Care Patient             Patient will benefit from skilled therapeutic intervention in order to improve the following deficits and impairments:  Decreased range of motion, Difficulty walking, Increased muscle spasms, Decreased activity tolerance, Pain, Decreased balance, Decreased  strength, Increased edema  Visit Diagnosis: Pain in joint involving right ankle and foot - Plan: PT plan of care cert/re-cert  Difficulty in walking, not elsewhere classified - Plan: PT plan of care cert/re-cert  Muscle weakness (generalized) - Plan: PT plan of care cert/re-cert  Localized edema - Plan: PT plan of care cert/re-cert  Stiffness of right ankle, not elsewhere classified - Plan: PT plan of care cert/re-cert     Problem List Patient Active Problem List   Diagnosis Date Noted   Epidermoid cyst of skin 08/06/2019   Intractable right heel pain 05/09/2019   Peroneal tendinitis of right lower leg 02/28/2019   Well woman exam without gynecological exam 07/31/2018   Vitamin D deficiency 09/01/2017   Iron deficiency 07/08/2017   Hypothyroidism 05/27/2017   Midline cystocele 10/31/2014  Urethrocele 10/31/2014   Coccyx pain 10/17/2014   Proteinuria 10/17/2014   Rectocele 10/17/2014   OTHER CONSTIPATION 01/13/2010    Juel Burrow, PT, DPT 06/25/2021, 1:17 PM  [place]. [place], [place], [address] Phone: [phone]   Fax:  [phone]  Name: SHELSEY RIETH MRN: [id number] Date of Birth: 24-Mar-1966

## 2021-06-25 NOTE — Patient Instructions (Signed)
Access Code: [email or web address] URL: [email or web address] Date: 06/25/2021 Prepared by: Shelby Dubin Zanetta Dehaan  Exercises Seated Ankle Plantar Flexion with Resistance Loop - 2 x daily - 7 x weekly - 2 sets - 10 reps Seated Ankle Dorsiflexion with Resistance - 2 x daily - 7 x weekly - 2 sets - 10 reps Seated Ankle Eversion with Resistance - 2 x daily - 7 x weekly - 2 sets - 10 reps Seated Ankle Inversion with Resistance - 2 x daily - 7 x weekly - 2 sets - 10 reps Gastroc Stretch on Wall - 2 x daily - 7 x weekly - 1 sets - 3 reps - 20 sec hold Soleus Stretch on Wall - 2 x daily - 7 x weekly - 1 sets - 3 reps - 20 sec hold

## 2021-06-30 ENCOUNTER — Other Ambulatory Visit: Payer: Self-pay

## 2021-06-30 ENCOUNTER — Encounter: Payer: Self-pay | Admitting: Physical Therapy

## 2021-06-30 ENCOUNTER — Ambulatory Visit: Payer: Federal, State, Local not specified - PPO | Admitting: Physical Therapy

## 2021-06-30 DIAGNOSIS — M25671 Stiffness of right ankle, not elsewhere classified: Secondary | ICD-10-CM | POA: Diagnosis not present

## 2021-06-30 DIAGNOSIS — R262 Difficulty in walking, not elsewhere classified: Secondary | ICD-10-CM

## 2021-06-30 DIAGNOSIS — M6281 Muscle weakness (generalized): Secondary | ICD-10-CM

## 2021-06-30 DIAGNOSIS — R600 Localized edema: Secondary | ICD-10-CM | POA: Diagnosis not present

## 2021-06-30 DIAGNOSIS — M25571 Pain in right ankle and joints of right foot: Secondary | ICD-10-CM | POA: Diagnosis not present

## 2021-06-30 NOTE — Therapy (Signed)
[place]. [place], [place], [address] Phone: [phone]   Fax:  [phone]  Physical Therapy Treatment  Patient Details  Name: Sabrina Mcdaniel MRN: [id number] Date of Birth: June 11, 1966 Referring Provider (PT): Dr Tyson Dense   Encounter Date: 06/30/2021   PT End of Session - 06/30/21 1056     Visit Number 2    Date for PT Re-Evaluation 08/14/21    PT Start Time 1015    PT Stop Time 1056    PT Time Calculation (min) 41 min    Activity Tolerance Patient tolerated treatment well    Behavior During Therapy [facility] for tasks assessed/performed             Past Medical History:  Diagnosis Date   Anemia    Iron deficiency   B12 deficiency    Cholelithiasis    Depression    GERD (gastroesophageal reflux disease)    Hiatal hernia 2014   Migraines    Osteopenia    Thyroid disease    hypo    Past Surgical History:  Procedure Laterality Date   ABDOMINAL HYSTERECTOMY  2009   anterior and posterior repair   COLONOSCOPY  2011   hyperplastic polyps   ESOPHAGOGASTRODUODENOSCOPY ENDOSCOPY  2013   LASIK  2008   PLANTAR FASCIA SURGERY Left 2018   TONSILLECTOMY     TUBAL LIGATION  1999    There were no vitals filed for this visit.   Subjective Assessment - 06/30/21 1014     Subjective Compliant with HEP. reports some pain and stiffness   Currently in Pain? Yes    Pain Score 2     Pain Location Ankle    Pain Orientation Right                               [facility] Adult PT Treatment/Exercise - 06/30/21 0001       Ankle Exercises: Aerobic   Recumbent Bike L2.3 x6 min      Ankle Exercises: Stretches   Gastroc Stretch 10 seconds;4 reps      Ankle Exercises: Seated   ABC's 1 rep    Ankle Circles/Pumps AROM;20 reps    Other Seated Ankle Exercises Yred tband for PF, DF, inv, evr R ankle x15 each      Ankle Exercises: Standing   SLS RLE 2x10 sec    Rocker Board 2 minutes    PR/DF,IN/EV   Other Standing Ankle Exercises 6in step ups x10 each, Sit to stanx 3x5 on airex    Other Standing Ankle Exercises Resisted gait 30lb x3 each; Fitter first RLE in all directions some SLS; 4in step downs 2x10 with UE support                       PT Short Term Goals - 06/30/21 1102       PT SHORT TERM GOAL #1   Title Pt will be independent with initial HEP.    Status Partially Met               PT Long Term Goals - 06/25/21 1309       PT LONG TERM GOAL #1   Title Pt will be independent with advanced HEP.    Time 8    Period Weeks    Status New      PT LONG TERM GOAL #2  Title Increase FOTO to 70% to allow pt to be more independent with hobbies.    Time 8    Period Weeks    Status New      PT LONG TERM GOAL #3   Title Pt will be able to return to shorter hiking trails without an increase of R foot pain.    Time 8    Period Weeks    Status New      PT LONG TERM GOAL #4   Title Increase R foot strength to 4+/5 to allow pt to negotiate steps..    Time 8    Period Weeks      PT LONG TERM GOAL #5   Title Pt will be able to negotiate a flight of stairs with reciprocal gait pattern without increased pain to allow her to do laundry in her basement.    Time 8    Period Weeks    Status New                   Plan - 06/30/21 1057     Clinical Impression Statement Pt enters clinic reporting no pain with walking, she reports a pull with som motions. Goo R ankle ROM noted with seated ABC's and ROM interventions. Cue to allow ankle to articulate with resisted gait and rocker board. Pt expressed some L knee pain with sit to stands but report none today. Pt voiced that she was very active and was ready to return to hiking. Some difficulty with step downs forcing R ankle DF and on the fitter first. Advised pt of possible soreness over the next few days.    Examination-Participation Restrictions Yard Work    Rehab Potential Good    PT Frequency  2x / week    PT Duration 8 weeks    PT Treatment/Interventions ADLs/Self Care Home Management;Cryotherapy;Electrical Stimulation;Iontophoresis 44m/ml Dexamethasone;Moist Heat;Ultrasound;Gait training;Stair training;Functional mobility training;Therapeutic activities;Therapeutic exercise;Balance training;Patient/family education;Manual techniques;Scar mobilization;Passive range of motion;Dry needling;Taping;Vasopneumatic Device;Joint Manipulations    PT Next Visit Plan strengthening, ROM, modalities as indicated             Patient will benefit from skilled therapeutic intervention in order to improve the following deficits and impairments:  Decreased range of motion, Difficulty walking, Increased muscle spasms, Decreased activity tolerance, Pain, Decreased balance, Decreased strength, Increased edema  Visit Diagnosis: Difficulty in walking, not elsewhere classified  Muscle weakness (generalized)  Stiffness of right ankle, not elsewhere classified     Problem List Patient Active Problem List   Diagnosis Date Noted   Epidermoid cyst of skin 08/06/2019   Intractable right heel pain 05/09/2019   Peroneal tendinitis of right lower leg 02/28/2019   Well woman exam without gynecological exam 07/31/2018   Vitamin D deficiency 09/01/2017   Iron deficiency 07/08/2017   Hypothyroidism 05/27/2017   Midline cystocele 10/31/2014   Urethrocele 10/31/2014   Coccyx pain 10/17/2014   Proteinuria 10/17/2014   Rectocele 10/17/2014   OTHER CONSTIPATION 01/13/2010    RScot Jun PTA 06/30/2021, 11:02 AM  [place] [address]Phone: [phone]  Fax:  [phone] Name: Sabrina ZELAYAMRN: [id number] of Birth: 3Mar 27, 1967

## 2021-07-02 ENCOUNTER — Encounter: Payer: Self-pay | Admitting: Physical Therapy

## 2021-07-02 ENCOUNTER — Ambulatory Visit: Payer: Federal, State, Local not specified - PPO | Admitting: Physical Therapy

## 2021-07-02 ENCOUNTER — Other Ambulatory Visit: Payer: Self-pay

## 2021-07-02 DIAGNOSIS — M6281 Muscle weakness (generalized): Secondary | ICD-10-CM

## 2021-07-02 DIAGNOSIS — M25671 Stiffness of right ankle, not elsewhere classified: Secondary | ICD-10-CM

## 2021-07-02 DIAGNOSIS — R262 Difficulty in walking, not elsewhere classified: Secondary | ICD-10-CM | POA: Diagnosis not present

## 2021-07-02 DIAGNOSIS — M25571 Pain in right ankle and joints of right foot: Secondary | ICD-10-CM | POA: Diagnosis not present

## 2021-07-02 DIAGNOSIS — R600 Localized edema: Secondary | ICD-10-CM | POA: Diagnosis not present

## 2021-07-02 NOTE — Therapy (Signed)
[place]. [place], [place], [address] Phone: [phone]   Fax:  [phone]  Physical Therapy Treatment  Patient Details  Name: Sabrina Mcdaniel MRN: [id number] Date of Birth: 28-Nov-1965 Referring Provider (PT): Dr Tyson Dense   Encounter Date: 07/02/2021   PT End of Session - 07/02/21 1054     Visit Number 3    Date for PT Re-Evaluation 08/14/21    PT Start Time 1015    PT Stop Time 1059    PT Time Calculation (min) 44 min    Activity Tolerance Patient tolerated treatment well    Behavior During Therapy [facility] for tasks assessed/performed             Past Medical History:  Diagnosis Date   Anemia    Iron deficiency   B12 deficiency    Cholelithiasis    Depression    GERD (gastroesophageal reflux disease)    Hiatal hernia 2014   Migraines    Osteopenia    Thyroid disease    hypo    Past Surgical History:  Procedure Laterality Date   ABDOMINAL HYSTERECTOMY  2009   anterior and posterior repair   COLONOSCOPY  2011   hyperplastic polyps   ESOPHAGOGASTRODUODENOSCOPY ENDOSCOPY  2013   LASIK  2008   PLANTAR FASCIA SURGERY Left 2018   TONSILLECTOMY     TUBAL LIGATION  1999    There were no vitals filed for this visit.   Subjective Assessment - 07/02/21 1016     Subjective Was not too bad after last session    Currently in Pain? No/denies                               [facility] Adult PT Treatment/Exercise - 07/02/21 0001       Ambulation/Gait   Ambulation/Gait Yes    Assistive device None    Gait Pattern Step-through pattern;Decreased dorsiflexion - right    Ambulation Surface Unlevel;Outdoor;Paved    Stairs Yes    Stairs Assistance 6: Modified independent (Device/Increase time)    Stair Management Technique One rail Right    Number of Stairs 36    Height of Stairs 6    Gait Comments Gait to back building 2 flights of stairs then back down out door, up hill ambulation       High Level Balance   High Level Balance Activities Side stepping;Backward walking   on balnce beam     Ankle Exercises: Aerobic   Elliptical L1.5 x3 min    Nustep L4 x 4 min LE only      Ankle Exercises: Stretches   Gastroc Stretch 4 reps;10 seconds;20 seconds      Ankle Exercises: Standing   Rocker Board 2 minutes   PF/DF,IN/EV   Other Standing Ankle Exercises Fitter first RLE static balance 3x10 sec    Other Standing Ankle Exercises heel raises 2x10 2 sec hold                       PT Short Term Goals - 06/30/21 1102       PT SHORT TERM GOAL #1   Title Pt will be independent with initial HEP.    Status Partially Met               PT Long Term Goals - 06/25/21 1309       PT LONG  TERM GOAL #1   Title Pt will be independent with advanced HEP.    Time 8    Period Weeks    Status New      PT LONG TERM GOAL #2   Title Increase FOTO to 70% to allow pt to be more independent with hobbies.    Time 8    Period Weeks    Status New      PT LONG TERM GOAL #3   Title Pt will be able to return to shorter hiking trails without an increase of R foot pain.    Time 8    Period Weeks    Status New      PT LONG TERM GOAL #4   Title Increase R foot strength to 4+/5 to allow pt to negotiate steps..    Time 8    Period Weeks      PT LONG TERM GOAL #5   Title Pt will be able to negotiate a flight of stairs with reciprocal gait pattern without increased pain to allow her to do laundry in her basement.    Time 8    Period Weeks    Status New                   Plan - 07/02/21 1055     Clinical Impression Statement Pt did well overall today. Today's session focused more son functional interventions Decrease R ankle DF does cause some difficulty descending stairs. Pt reports a pull with up hill ambulation. RLE fatigue noted with SLS on fitter first. CGA needed with side steps and tandem waling on balance beam. Calf burning reported with heel raises.     Examination-Participation Restrictions Yard Work    Stability/Clinical Decision Making Stable/Uncomplicated    Rehab Potential Good    PT Frequency 2x / week    PT Duration 8 weeks    PT Treatment/Interventions ADLs/Self Care Home Management;Cryotherapy;Electrical Stimulation;Iontophoresis 60m/ml Dexamethasone;Moist Heat;Ultrasound;Gait training;Stair training;Functional mobility training;Therapeutic activities;Therapeutic exercise;Balance training;Patient/family education;Manual techniques;Scar mobilization;Passive range of motion;Dry needling;Taping;Vasopneumatic Device;Joint Manipulations    PT Next Visit Plan strengthening, ROM, modalities as indicated             Patient will benefit from skilled therapeutic intervention in order to improve the following deficits and impairments:  Decreased range of motion, Difficulty walking, Increased muscle spasms, Decreased activity tolerance, Pain, Decreased balance, Decreased strength, Increased edema  Visit Diagnosis: Difficulty in walking, not elsewhere classified  Muscle weakness (generalized)  Pain in joint involving right ankle and foot  Stiffness of right ankle, not elsewhere classified     Problem List Patient Active Problem List   Diagnosis Date Noted   Epidermoid cyst of skin 08/06/2019   Intractable right heel pain 05/09/2019   Peroneal tendinitis of right lower leg 02/28/2019   Well woman exam without gynecological exam 07/31/2018   Vitamin D deficiency 09/01/2017   Iron deficiency 07/08/2017   Hypothyroidism 05/27/2017   Midline cystocele 10/31/2014   Urethrocele 10/31/2014   Coccyx pain 10/17/2014   Proteinuria 10/17/2014   Rectocele 10/17/2014   OTHER CONSTIPATION 01/13/2010    RScot Jun PTA 07/02/2021, 11:00 AM  [place] [address]Phone: [phone]  Fax:  [phone] Name: Sabrina ENGEBRETSENMRN:  [id number] of Birth: [birth year]

## 2021-07-03 ENCOUNTER — Other Ambulatory Visit: Payer: Self-pay | Admitting: Family Medicine

## 2021-07-03 DIAGNOSIS — Z1231 Encounter for screening mammogram for malignant neoplasm of breast: Secondary | ICD-10-CM

## 2021-07-07 ENCOUNTER — Other Ambulatory Visit: Payer: Self-pay

## 2021-07-07 ENCOUNTER — Encounter: Payer: Self-pay | Admitting: Physical Therapy

## 2021-07-07 ENCOUNTER — Ambulatory Visit: Payer: Federal, State, Local not specified - PPO | Admitting: Physical Therapy

## 2021-07-07 DIAGNOSIS — M25571 Pain in right ankle and joints of right foot: Secondary | ICD-10-CM

## 2021-07-07 DIAGNOSIS — M6281 Muscle weakness (generalized): Secondary | ICD-10-CM

## 2021-07-07 DIAGNOSIS — M25671 Stiffness of right ankle, not elsewhere classified: Secondary | ICD-10-CM | POA: Diagnosis not present

## 2021-07-07 DIAGNOSIS — R262 Difficulty in walking, not elsewhere classified: Secondary | ICD-10-CM | POA: Diagnosis not present

## 2021-07-07 DIAGNOSIS — R600 Localized edema: Secondary | ICD-10-CM

## 2021-07-07 NOTE — Therapy (Signed)
[place]. [place], [place], [address] Phone: [phone]   Fax:  [phone]  Physical Therapy Treatment  Patient Details  Name: Sabrina Mcdaniel MRN: [id number] Date of Birth: 05-06-1966 Referring Provider (PT): Dr Tyson Dense   Encounter Date: 07/07/2021   PT End of Session - 07/07/21 1424     Date for PT Re-Evaluation 08/14/21    PT Start Time 1345    PT Stop Time 1430    PT Time Calculation (min) 45 min    Activity Tolerance Patient tolerated treatment well    Behavior During Therapy [facility] for tasks assessed/performed             Past Medical History:  Diagnosis Date   Anemia    Iron deficiency   B12 deficiency    Cholelithiasis    Depression    GERD (gastroesophageal reflux disease)    Hiatal hernia 2014   Migraines    Osteopenia    Thyroid disease    hypo    Past Surgical History:  Procedure Laterality Date   ABDOMINAL HYSTERECTOMY  2009   anterior and posterior repair   COLONOSCOPY  2011   hyperplastic polyps   ESOPHAGOGASTRODUODENOSCOPY ENDOSCOPY  2013   LASIK  2008   PLANTAR FASCIA SURGERY Left 2018   TONSILLECTOMY     TUBAL LIGATION  1999    There were no vitals filed for this visit.   Subjective Assessment - 07/07/21 1344     Subjective Walked a mile Sunday, some swelling afterwards    Currently in Pain? No/denies                               [facility] Adult PT Treatment/Exercise - 07/07/21 0001       Ambulation/Gait   Gait Comments gait outside aroud back building      Ankle Exercises: Aerobic   Elliptical L2 x 2 min    Recumbent Bike L3 x 6 min      Ankle Exercises: Machines for Strengthening   Cybex Leg Press leg press 30lb 2x10, Heel raises 30lb 2x1      Ankle Exercises: Stretches   Gastroc Stretch 4 reps;10 seconds;20 seconds      Ankle Exercises: Standing   Other Standing Ankle Exercises Eccentric lateral step down RLE x10, forward step  dowen and back 4in    Other Standing Ankle Exercises Toe and heel walking, heel raises black bar 2x10                       PT Short Term Goals - 06/30/21 1102       PT SHORT TERM GOAL #1   Title Pt will be independent with initial HEP.    Status Partially Met               PT Long Term Goals - 06/25/21 1309       PT LONG TERM GOAL #1   Title Pt will be independent with advanced HEP.    Time 8    Period Weeks    Status New      PT LONG TERM GOAL #2   Title Increase FOTO to 70% to allow pt to be more independent with hobbies.    Time 8    Period Weeks    Status New      PT LONG TERM GOAL #3  Title Pt will be able to return to shorter hiking trails without an increase of R foot pain.    Time 8    Period Weeks    Status New      PT LONG TERM GOAL #4   Title Increase R foot strength to 4+/5 to allow pt to negotiate steps..    Time 8    Period Weeks      PT LONG TERM GOAL #5   Title Pt will be able to negotiate a flight of stairs with reciprocal gait pattern without increased pain to allow her to do laundry in her basement.    Time 8    Period Weeks    Status New                   Plan - 07/07/21 1424     Clinical Impression Statement Progressed with outdoor ambulation up and down slope. Pt reports decrease push off with R ankle going up slope. Some R ankle weakness present with toe walking evident by decrease stance time on RLE. Weakness reported with heel raises on black bar and leg press.    Examination-Participation Restrictions Yard Work    Stability/Clinical Decision Making Stable/Uncomplicated    Rehab Potential Good    PT Frequency 2x / week    PT Treatment/Interventions ADLs/Self Care Home Management;Cryotherapy;Electrical Stimulation;Iontophoresis 19m/ml Dexamethasone;Moist Heat;Ultrasound;Gait training;Stair training;Functional mobility training;Therapeutic activities;Therapeutic exercise;Balance training;Patient/family  education;Manual techniques;Scar mobilization;Passive range of motion;Dry needling;Taping;Vasopneumatic Device;Joint Manipulations    PT Next Visit Plan strengthening, ROM, modalities as indicated, ROM    PT Home Exercise Plan Toe and heel walking             Patient will benefit from skilled therapeutic intervention in order to improve the following deficits and impairments:  Decreased range of motion, Difficulty walking, Increased muscle spasms, Decreased activity tolerance, Pain, Decreased balance, Decreased strength, Increased edema  Visit Diagnosis: Muscle weakness (generalized)  Pain in joint involving right ankle and foot  Difficulty in walking, not elsewhere classified  Stiffness of right ankle, not elsewhere classified  Localized edema     Problem List Patient Active Problem List   Diagnosis Date Noted   Epidermoid cyst of skin 08/06/2019   Intractable right heel pain 05/09/2019   Peroneal tendinitis of right lower leg 02/28/2019   Well woman exam without gynecological exam 07/31/2018   Vitamin D deficiency 09/01/2017   Iron deficiency 07/08/2017   Hypothyroidism 05/27/2017   Midline cystocele 10/31/2014   Urethrocele 10/31/2014   Coccyx pain 10/17/2014   Proteinuria 10/17/2014   Rectocele 10/17/2014   OTHER CONSTIPATION 01/13/2010    RScot Jun PTA 07/07/2021, 2:30 PM  [place] [address]Phone: [phone]  Fax:  [phone] Name: Sabrina MOFFAMRN: [id number] of Birth: 3June 12, 1967

## 2021-07-08 ENCOUNTER — Ambulatory Visit
Admission: RE | Admit: 2021-07-08 | Discharge: 2021-07-08 | Disposition: A | Discharge status: Home / Self Care | Payer: Federal, State, Local not specified - PPO | Source: Ambulatory Visit

## 2021-07-08 DIAGNOSIS — Z1231 Encounter for screening mammogram for malignant neoplasm of breast: Secondary | ICD-10-CM

## 2021-07-09 ENCOUNTER — Ambulatory Visit: Payer: Federal, State, Local not specified - PPO | Admitting: Physical Therapy

## 2021-07-09 ENCOUNTER — Encounter: Payer: Self-pay | Admitting: Physical Therapy

## 2021-07-09 ENCOUNTER — Other Ambulatory Visit: Payer: Self-pay

## 2021-07-09 DIAGNOSIS — M6281 Muscle weakness (generalized): Secondary | ICD-10-CM

## 2021-07-09 DIAGNOSIS — M25671 Stiffness of right ankle, not elsewhere classified: Secondary | ICD-10-CM

## 2021-07-09 DIAGNOSIS — R600 Localized edema: Secondary | ICD-10-CM | POA: Diagnosis not present

## 2021-07-09 DIAGNOSIS — M25571 Pain in right ankle and joints of right foot: Secondary | ICD-10-CM | POA: Diagnosis not present

## 2021-07-09 DIAGNOSIS — R262 Difficulty in walking, not elsewhere classified: Secondary | ICD-10-CM | POA: Diagnosis not present

## 2021-07-09 NOTE — Therapy (Signed)
[place]. [place], [place], [address] Phone: [phone]   Fax:  [phone]  Physical Therapy Treatment  Patient Details  Name: Sabrina Mcdaniel MRN: [id number] Date of Birth: 05/16/1966 Referring Provider (PT): Dr Tyson Dense   Encounter Date: 07/09/2021   PT End of Session - 07/09/21 1349     Visit Number 4    Date for PT Re-Evaluation 08/14/21    PT Start Time 1349    PT Stop Time 1429    PT Time Calculation (min) 40 min    Activity Tolerance Patient tolerated treatment well    Behavior During Therapy [facility] for tasks assessed/performed             Past Medical History:  Diagnosis Date   Anemia    Iron deficiency   B12 deficiency    Cholelithiasis    Depression    GERD (gastroesophageal reflux disease)    Hiatal hernia 2014   Migraines    Osteopenia    Thyroid disease    hypo    Past Surgical History:  Procedure Laterality Date   ABDOMINAL HYSTERECTOMY  2009   anterior and posterior repair   COLONOSCOPY  2011   hyperplastic polyps   ESOPHAGOGASTRODUODENOSCOPY ENDOSCOPY  2013   LASIK  2008   PLANTAR FASCIA SURGERY Left 2018   TONSILLECTOMY     TUBAL LIGATION  1999    There were no vitals filed for this visit.   Subjective Assessment - 07/09/21 1349     Subjective Last visit inflamed her foot really bad. She had trouble doing her ADLS.    Pertinent History R foot plantar fasciitis    Patient Stated Goals I want to be a strong walker to be able to hike again.    Currently in Pain? Yes    Pain Score 2     Pain Location Ankle    Pain Orientation Right    Pain Descriptors / Indicators Discomfort                [facility] PT Assessment - 07/09/21 0001       Figure 8 Edema   Figure 8 - Right  51.5 cm                           [facility] Adult PT Treatment/Exercise - 07/09/21 0001       Ankle Exercises: Aerobic   Elliptical L2 x 2 min    Recumbent Bike L3 x 6 min       Ankle Exercises: Standing   SLS RLE x 10 sec; with toe taps x 5; on foam: multiple reps (challenging)    Rebounder Bosu step ups x 10 Rt; then with left knee flex x 10    Other Standing Ankle Exercises Eccentric lateral step down 6 RLE2  x10 6 inch, forward step down and heel tap 3x10 4in      Ankle Exercises: Machines for Strengthening   Cybex Leg Press leg press 30lb 2x10, Heel raises 30lb 2x1      Ankle Exercises: Stretches   Gastroc Stretch 1 rep;60 seconds                     PT Education - 07/09/21 1439     Education Details discussed techniques to decrease edema during the day such as elevating every 1-2 hours for 5 min with leg overhead and ankle pumps; also  compression socks or ACE wrap    Person(s) Educated Patient    Methods Explanation    Comprehension Verbalized understanding              PT Short Term Goals - 06/30/21 1102       PT SHORT TERM GOAL #1   Title Pt will be independent with initial HEP.    Status Partially Met               PT Long Term Goals - 06/25/21 1309       PT LONG TERM GOAL #1   Title Pt will be independent with advanced HEP.    Time 8    Period Weeks    Status New      PT LONG TERM GOAL #2   Title Increase FOTO to 70% to allow pt to be more independent with hobbies.    Time 8    Period Weeks    Status New      PT LONG TERM GOAL #3   Title Pt will be able to return to shorter hiking trails without an increase of R foot pain.    Time 8    Period Weeks    Status New      PT LONG TERM GOAL #4   Title Increase R foot strength to 4+/5 to allow pt to negotiate steps..    Time 8    Period Weeks      PT LONG TERM GOAL #5   Title Pt will be able to negotiate a flight of stairs with reciprocal gait pattern without increased pain to allow her to do laundry in her basement.    Time 8    Period Weeks    Status New                   Plan - 07/09/21 1430     Clinical Impression Statement Patient  reporting increased inflammation following toe/heel walks last visit with 6-7/10 pain. Fig 8 measurement shows 1 cm increase since eval. Pain decreased to 2/10 today and pt able to tolerate all exercises without increased pain. Advised pt to gradually return to toe/heel walking every other day at home. SLS on foam and Bosu activities very challenging. She appears to have functional hip weakness with step downs in addition to quads.    PT Treatment/Interventions ADLs/Self Care Home Management;Cryotherapy;Electrical Stimulation;Iontophoresis 69m/ml Dexamethasone;Moist Heat;Ultrasound;Gait training;Stair training;Functional mobility training;Therapeutic activities;Therapeutic exercise;Balance training;Patient/family education;Manual techniques;Scar mobilization;Passive range of motion;Dry needling;Taping;Vasopneumatic Device;Joint Manipulations    PT Next Visit Plan strengthening, ROM, SLS,  modalities as indicated, ROM             Patient will benefit from skilled therapeutic intervention in order to improve the following deficits and impairments:  Decreased range of motion, Difficulty walking, Increased muscle spasms, Decreased activity tolerance, Pain, Decreased balance, Decreased strength, Increased edema  Visit Diagnosis: Muscle weakness (generalized)  Pain in joint involving right ankle and foot  Difficulty in walking, not elsewhere classified  Stiffness of right ankle, not elsewhere classified  Localized edema     Problem List Patient Active Problem List   Diagnosis Date Noted   Epidermoid cyst of skin 08/06/2019   Intractable right heel pain 05/09/2019   Peroneal tendinitis of right lower leg 02/28/2019   Well woman exam without gynecological exam 07/31/2018   Vitamin D deficiency 09/01/2017   Iron deficiency 07/08/2017   Hypothyroidism 05/27/2017   Midline cystocele 10/31/2014   Urethrocele 10/31/2014  Coccyx pain 10/17/2014   Proteinuria 10/17/2014   Rectocele  10/17/2014   OTHER CONSTIPATION 01/13/2010    Madelyn Flavors PT 07/09/2021, 2:42 PM  [place]. [place], [place], [address] Phone: [phone]   Fax:  [phone]  Name: Sabrina Mcdaniel MRN: [id number] Date of Birth: 1966-04-18

## 2021-07-13 ENCOUNTER — Encounter: Payer: Self-pay | Admitting: Family Medicine

## 2021-07-13 ENCOUNTER — Encounter: Payer: Self-pay | Admitting: Podiatry

## 2021-07-13 DIAGNOSIS — R928 Other abnormal and inconclusive findings on diagnostic imaging of breast: Secondary | ICD-10-CM | POA: Insufficient documentation

## 2021-07-14 ENCOUNTER — Other Ambulatory Visit: Payer: Self-pay

## 2021-07-14 ENCOUNTER — Encounter: Payer: Self-pay | Admitting: Physical Therapy

## 2021-07-14 ENCOUNTER — Other Ambulatory Visit: Payer: Self-pay | Admitting: Family Medicine

## 2021-07-14 ENCOUNTER — Ambulatory Visit: Payer: Federal, State, Local not specified - PPO | Admitting: Physical Therapy

## 2021-07-14 DIAGNOSIS — M25571 Pain in right ankle and joints of right foot: Secondary | ICD-10-CM | POA: Diagnosis not present

## 2021-07-14 DIAGNOSIS — M25671 Stiffness of right ankle, not elsewhere classified: Secondary | ICD-10-CM | POA: Diagnosis not present

## 2021-07-14 DIAGNOSIS — R928 Other abnormal and inconclusive findings on diagnostic imaging of breast: Secondary | ICD-10-CM

## 2021-07-14 DIAGNOSIS — R262 Difficulty in walking, not elsewhere classified: Secondary | ICD-10-CM

## 2021-07-14 DIAGNOSIS — R600 Localized edema: Secondary | ICD-10-CM | POA: Diagnosis not present

## 2021-07-14 DIAGNOSIS — M6281 Muscle weakness (generalized): Secondary | ICD-10-CM | POA: Diagnosis not present

## 2021-07-14 NOTE — Therapy (Signed)
[place] [place], [place], [address] Phone: [phone]   Fax:  [phone]  Physical Therapy Treatment  Patient Details  Name: Sabrina Mcdaniel MRN: [id number] Date of Birth: September 10, 1966 Referring Provider (PT): Dr Tyson Dense   Encounter Date: 07/14/2021   PT End of Session - 07/14/21 0931     Visit Number 6    Number of Visits --   visit count off since 3rd visit   Date for PT Re-Evaluation 08/14/21    PT Start Time 0931    PT Stop Time 1015    PT Time Calculation (min) 44 min    Activity Tolerance Patient tolerated treatment well    Behavior During Therapy [facility] for tasks assessed/performed             Past Medical History:  Diagnosis Date   Anemia    Iron deficiency   B12 deficiency    Cholelithiasis    Depression    GERD (gastroesophageal reflux disease)    Hiatal hernia 2014   Migraines    Osteopenia    Thyroid disease    hypo    Past Surgical History:  Procedure Laterality Date   ABDOMINAL HYSTERECTOMY  2009   anterior and posterior repair   COLONOSCOPY  2011   hyperplastic polyps   ESOPHAGOGASTRODUODENOSCOPY ENDOSCOPY  2013   LASIK  2008   PLANTAR FASCIA SURGERY Left 2018   TONSILLECTOMY     TUBAL LIGATION  1999    There were no vitals filed for this visit.   Subjective Assessment - 07/14/21 0934     Subjective Walked for 2 miles on [facility] trail. Iced after, no problem reported. She states she is having pain in ant talo/crural joint especially when she tries single leg heel raise.    How long can you walk comfortably? 2 miles    Patient Stated Goals I want to be a strong walker to be able to hike again.    Currently in Pain? Yes    Pain Score 1     Pain Location Ankle    Pain Orientation Right;Anterior    Pain Descriptors / Indicators Discomfort                               [facility] Adult PT Treatment/Exercise - 07/14/21 0001       Exercises    Exercises Ankle      Manual Therapy   Manual Therapy Joint mobilization;Soft tissue mobilization    Joint Mobilization talocrural; talofib and tibiotalar mobs post/ant; prox tib/fib post    Soft tissue mobilization to ant lower leg STM and IASTM with roller      Ankle Exercises: Aerobic   Elliptical L2 x 2 min      Ankle Exercises: Standing   Heel Raises Both;10 reps;3 seconds   Tempo: 3 sec up, 3 sec hold, 3 sec down   Heel Raises Limitations SL heel raise knee straight and knee bent x 10 each in down dog position hands on mat; also hands on back of chair bil heel raises knees bent    Other Standing Ankle Exercises Eccentric lateral step down RLE 3  x10 6 inch, forward step down and heel tap 3x10 4in    Other Standing Ankle Exercises chair squat 2x10; lunge on step with black band around talus x 10; also with PT overpressure      Ankle  Exercises: Supine   Other Supine Ankle Exercises PNF x 10 ea D1/D2 with PT resistance                     PT Education - 07/14/21 1152     Education Details HEP progressed    Person(s) Educated Patient    Methods Explanation;Demonstration;Verbal cues    Comprehension Verbalized understanding;Returned demonstration              PT Short Term Goals - 07/14/21 1002       PT SHORT TERM GOAL #1   Title Pt will be independent with initial HEP.    Status Achieved               PT Long Term Goals - 07/14/21 1002       PT LONG TERM GOAL #1   Title Pt will be independent with advanced HEP.    Status On-going      PT LONG TERM GOAL #3   Title Pt will be able to return to shorter hiking trails without an increase of R foot pain.    Baseline increased to 2 miles on walking path    Status On-going      PT LONG TERM GOAL #4   Title Increase R foot strength to 4+/5 to allow pt to negotiate steps..    Baseline PF still weaker than 4+/5    Status Partially Met      PT LONG TERM GOAL #5   Title Pt will be able to negotiate a  flight of stairs with reciprocal gait pattern without increased pain to allow her to do laundry in her basement.    Baseline a little stiff with DF on descent    Status Partially Met                   Plan - 07/14/21 1146     Clinical Impression Statement Patient is progressing well with walking endurance outdoors and on different surfaces. She was able to walk 2 miles with inclines/declines and stairs without pain. She still has PF weakness on the right LE and is unable to to a single leg heel raise. She has eccentric quad weakness right > left affecting stair descent and stand to sit. She also reports ant ankle pain with heel raises. She will benefit from continued PT to address these deficits.    PT Frequency 2x / week    PT Duration 8 weeks    PT Treatment/Interventions ADLs/Self Care Home Management;Cryotherapy;Electrical Stimulation;Iontophoresis 39m/ml Dexamethasone;Moist Heat;Ultrasound;Gait training;Stair training;Functional mobility training;Therapeutic activities;Therapeutic exercise;Balance training;Patient/family education;Manual techniques;Scar mobilization;Passive range of motion;Dry needling;Taping;Vasopneumatic Device;Joint Manipulations    PT Next Visit Plan continue SL/Dbl leg heel raise and eccentric quad strengthening    PT Home Exercise Plan tempo heel raises; SL heel raise at angle; eccentric step downs, ankle PNF    Consulted and Agree with Plan of Care Patient             Patient will benefit from skilled therapeutic intervention in order to improve the following deficits and impairments:  Decreased range of motion, Difficulty walking, Increased muscle spasms, Decreased activity tolerance, Pain, Decreased balance, Decreased strength, Increased edema  Visit Diagnosis: Muscle weakness (generalized)  Pain in joint involving right ankle and foot  Difficulty in walking, not elsewhere classified  Stiffness of right ankle, not elsewhere  classified  Localized edema     Problem List Patient Active Problem List   Diagnosis Date Noted  Abnormal mammogram of left breast 07/13/2021   Epidermoid cyst of skin 08/06/2019   Intractable right heel pain 05/09/2019   Peroneal tendinitis of right lower leg 02/28/2019   Well woman exam without gynecological exam 07/31/2018   Vitamin D deficiency 09/01/2017   Iron deficiency 07/08/2017   Hypothyroidism 05/27/2017   Midline cystocele 10/31/2014   Urethrocele 10/31/2014   Coccyx pain 10/17/2014   Proteinuria 10/17/2014   Rectocele 10/17/2014   OTHER CONSTIPATION 01/13/2010   Madelyn Flavors, PT 07/14/2021, 11:52 AM  [address] [place], [place], [address] Phone: [phone]   Fax:  [phone]  Name: Sabrina Mcdaniel MRN: [id number] Date of Birth: [birth year]

## 2021-07-16 ENCOUNTER — Ambulatory Visit: Payer: Federal, State, Local not specified - PPO | Admitting: Podiatry

## 2021-07-16 ENCOUNTER — Other Ambulatory Visit: Payer: Self-pay

## 2021-07-16 ENCOUNTER — Ambulatory Visit: Payer: Federal, State, Local not specified - PPO | Admitting: Physical Therapy

## 2021-07-16 ENCOUNTER — Encounter: Payer: Self-pay | Admitting: Podiatry

## 2021-07-16 ENCOUNTER — Encounter: Payer: Self-pay | Admitting: Physical Therapy

## 2021-07-16 DIAGNOSIS — M6281 Muscle weakness (generalized): Secondary | ICD-10-CM

## 2021-07-16 DIAGNOSIS — M25671 Stiffness of right ankle, not elsewhere classified: Secondary | ICD-10-CM

## 2021-07-16 DIAGNOSIS — M5413 Radiculopathy, cervicothoracic region: Secondary | ICD-10-CM | POA: Diagnosis not present

## 2021-07-16 DIAGNOSIS — M722 Plantar fascial fibromatosis: Secondary | ICD-10-CM | POA: Diagnosis not present

## 2021-07-16 DIAGNOSIS — M7671 Peroneal tendinitis, right leg: Secondary | ICD-10-CM | POA: Diagnosis not present

## 2021-07-16 DIAGNOSIS — Z9889 Other specified postprocedural states: Secondary | ICD-10-CM | POA: Diagnosis not present

## 2021-07-16 DIAGNOSIS — M9901 Segmental and somatic dysfunction of cervical region: Secondary | ICD-10-CM | POA: Diagnosis not present

## 2021-07-16 DIAGNOSIS — M25571 Pain in right ankle and joints of right foot: Secondary | ICD-10-CM

## 2021-07-16 DIAGNOSIS — M7912 Myalgia of auxiliary muscles, head and neck: Secondary | ICD-10-CM | POA: Diagnosis not present

## 2021-07-16 DIAGNOSIS — R600 Localized edema: Secondary | ICD-10-CM | POA: Diagnosis not present

## 2021-07-16 DIAGNOSIS — M546 Pain in thoracic spine: Secondary | ICD-10-CM | POA: Diagnosis not present

## 2021-07-16 DIAGNOSIS — R262 Difficulty in walking, not elsewhere classified: Secondary | ICD-10-CM | POA: Diagnosis not present

## 2021-07-16 MED ORDER — METHYLPREDNISOLONE 4 MG PO TBPK: ORAL_TABLET | ORAL | 0 refills | Status: DC

## 2021-07-16 NOTE — Progress Notes (Signed)
She presents today date of surgery 03/27/2021 right foot pain status post EPF and peroneal tendon repair with cast.  She is completed first round of physical therapy and is doing much better as far as duration of walking but she still not cannot do a 1 foot toe raise.  States that all in all she appears to be doing much better.  She states that she started back on October 3 working  Objective: Vital signs are stable alert and oriented x3.  Pulses are palpable.  There is no erythema cellulitis drainage or odor some mild edema.  She has good plantar flexion inversion eversion dorsiflexion all against resistance.  She demonstrates a lack of ability to go up on her toes.  I read the notes from physical therapy which demonstrated that her right lower extremity needed strengthening.  But a lot of her soreness is gone from her surgical site.  Assessment well-healing surgical foot we need to strengthen now due to the use of castings boots and the pain that she was doing prior to surgery.  Anterior ankle pain.  Possible capsulitis  Plan: Made special request for the physical therapist to treat her aggressively as far strengthening.  Also called in a methylprednisolone to help with some of the soreness of her anterior ankle.

## 2021-07-16 NOTE — Therapy (Signed)
[address]. [place], [place], [address] Phone: [phone]   Fax:  [phone]  Physical Therapy Treatment  Patient Details  Name: Sabrina Mcdaniel MRN: [id number] Date of Birth: 26-Apr-1966 Referring Provider (PT): Dr Tyson Dense   Encounter Date: 07/16/2021   PT End of Session - 07/16/21 1432     Visit Number 7    Date for PT Re-Evaluation 08/14/21    PT Start Time 1345    PT Stop Time 1430    PT Time Calculation (min) 45 min    Activity Tolerance Patient tolerated treatment well    Behavior During Therapy [facility] for tasks assessed/performed             Past Medical History:  Diagnosis Date   Anemia    Iron deficiency   B12 deficiency    Cholelithiasis    Depression    GERD (gastroesophageal reflux disease)    Hiatal hernia 2014   Migraines    Osteopenia    Thyroid disease    hypo    Past Surgical History:  Procedure Laterality Date   ABDOMINAL HYSTERECTOMY  2009   anterior and posterior repair   COLONOSCOPY  2011   hyperplastic polyps   ESOPHAGOGASTRODUODENOSCOPY ENDOSCOPY  2013   LASIK  2008   PLANTAR FASCIA SURGERY Left 2018   TONSILLECTOMY     TUBAL LIGATION  1999    There were no vitals filed for this visit.   Subjective Assessment - 07/16/21 1344     Subjective Went to the MD, Md thinks she has some capsulitis. Feeling good    Currently in Pain? Yes    Pain Location Ankle    Pain Orientation Right                               [facility] Adult PT Treatment/Exercise - 07/16/21 0001       Ankle Exercises: Aerobic   Recumbent Bike L3 x 6 min      Ankle Exercises: Machines for Strengthening   Cybex Leg Press RLE 20lb 2x10, RLE heel raise 20lb 2x10      Ankle Exercises: Standing   Heel Raises Both;10 reps;3 seconds   3 sec tempo   Other Standing Ankle Exercises forward step down and heel tap 3x10 4in    Other Standing Ankle Exercises RLE 4in step up with heel  raise 3x5; Fitter first all directions; RLE PF black band 2x20                       PT Short Term Goals - 07/14/21 1002       PT SHORT TERM GOAL #1   Title Pt will be independent with initial HEP.    Status Achieved               PT Long Term Goals - 07/14/21 1002       PT LONG TERM GOAL #1   Title Pt will be independent with advanced HEP.    Status On-going      PT LONG TERM GOAL #3   Title Pt will be able to return to shorter hiking trails without an increase of R foot pain.    Baseline increased to 2 miles on walking path    Status On-going      PT LONG TERM GOAL #4   Title Increase R foot strength to  4+/5 to allow pt to negotiate steps..    Baseline PF still weaker than 4+/5    Status Partially Met      PT LONG TERM GOAL #5   Title Pt will be able to negotiate a flight of stairs with reciprocal gait pattern without increased pain to allow her to do laundry in her basement.    Baseline a little stiff with DF on descent    Status Partially Met                   Plan - 07/16/21 1432     Clinical Impression Statement Pt able to complete all interventions. She has significant weakness with R ankle plantar flexion. Unable to tolerated RLE SL heel raises concentrically or eccentrically. R ankle pain reported with heel raises.  Difficulty with clockwise circles with fitter first.    Examination-Participation Restrictions Yard Work    Stability/Clinical Decision Making Stable/Uncomplicated    Rehab Potential Good    PT Frequency 2x / week    PT Duration 8 weeks    PT Treatment/Interventions ADLs/Self Care Home Management;Cryotherapy;Electrical Stimulation;Iontophoresis 10m/ml Dexamethasone;Moist Heat;Ultrasound;Gait training;Stair training;Functional mobility training;Therapeutic activities;Therapeutic exercise;Balance training;Patient/family education;Manual techniques;Scar mobilization;Passive range of motion;Dry needling;Taping;Vasopneumatic  Device;Joint Manipulations    PT Next Visit Plan continue SL/Dbl leg heel raise and eccentric quad strengthening             Patient will benefit from skilled therapeutic intervention in order to improve the following deficits and impairments:  Decreased range of motion, Difficulty walking, Increased muscle spasms, Decreased activity tolerance, Pain, Decreased balance, Decreased strength, Increased edema  Visit Diagnosis: Pain in joint involving right ankle and foot  Muscle weakness (generalized)  Difficulty in walking, not elsewhere classified  Stiffness of right ankle, not elsewhere classified     Problem List Patient Active Problem List   Diagnosis Date Noted   Abnormal mammogram of left breast 07/13/2021   Epidermoid cyst of skin 08/06/2019   Intractable right heel pain 05/09/2019   Peroneal tendinitis of right lower leg 02/28/2019   Well woman exam without gynecological exam 07/31/2018   Vitamin D deficiency 09/01/2017   Iron deficiency 07/08/2017   Hypothyroidism 05/27/2017   Midline cystocele 10/31/2014   Urethrocele 10/31/2014   Coccyx pain 10/17/2014   Proteinuria 10/17/2014   Rectocele 10/17/2014   OTHER CONSTIPATION 01/13/2010    RScot Jun PTA 07/16/2021, 2:37 PM  [place] [address]Phone: [phone]  Fax:  [phone] Name: Sabrina MASSEMRN: [id number] of Birth: [birth year]

## 2021-07-20 ENCOUNTER — Encounter: Payer: Self-pay | Admitting: Podiatry

## 2021-07-21 DIAGNOSIS — M546 Pain in thoracic spine: Secondary | ICD-10-CM | POA: Diagnosis not present

## 2021-07-21 DIAGNOSIS — M7912 Myalgia of auxiliary muscles, head and neck: Secondary | ICD-10-CM | POA: Diagnosis not present

## 2021-07-21 DIAGNOSIS — M9901 Segmental and somatic dysfunction of cervical region: Secondary | ICD-10-CM | POA: Diagnosis not present

## 2021-07-21 DIAGNOSIS — M5413 Radiculopathy, cervicothoracic region: Secondary | ICD-10-CM | POA: Diagnosis not present

## 2021-07-22 ENCOUNTER — Ambulatory Visit: Payer: Federal, State, Local not specified - PPO | Attending: Podiatry | Admitting: Physical Therapy

## 2021-07-22 ENCOUNTER — Other Ambulatory Visit: Payer: Self-pay

## 2021-07-22 DIAGNOSIS — R600 Localized edema: Secondary | ICD-10-CM | POA: Insufficient documentation

## 2021-07-22 DIAGNOSIS — M25571 Pain in right ankle and joints of right foot: Secondary | ICD-10-CM | POA: Insufficient documentation

## 2021-07-22 DIAGNOSIS — M6281 Muscle weakness (generalized): Secondary | ICD-10-CM | POA: Diagnosis not present

## 2021-07-22 DIAGNOSIS — R262 Difficulty in walking, not elsewhere classified: Secondary | ICD-10-CM | POA: Diagnosis not present

## 2021-07-22 DIAGNOSIS — M25671 Stiffness of right ankle, not elsewhere classified: Secondary | ICD-10-CM | POA: Diagnosis not present

## 2021-07-22 NOTE — Therapy (Signed)
[address]. [place], [place], [address] Phone: [phone]   Fax:  [phone]  Physical Therapy Treatment  Patient Details  Name: Sabrina Mcdaniel MRN: [id number] Date of Birth: 10-22-1965 Referring Provider (PT): Dr Tyson Dense   Encounter Date: 07/22/2021   PT End of Session - 07/22/21 1424     Visit Number 8    Date for PT Re-Evaluation 08/14/21    PT Start Time 1345    PT Stop Time 9381    PT Time Calculation (min) 43 min    Activity Tolerance Patient tolerated treatment well    Behavior During Therapy [facility] for tasks assessed/performed             Past Medical History:  Diagnosis Date   Anemia    Iron deficiency   B12 deficiency    Cholelithiasis    Depression    GERD (gastroesophageal reflux disease)    Hiatal hernia 2014   Migraines    Osteopenia    Thyroid disease    hypo    Past Surgical History:  Procedure Laterality Date   ABDOMINAL HYSTERECTOMY  2009   anterior and posterior repair   COLONOSCOPY  2011   hyperplastic polyps   ESOPHAGOGASTRODUODENOSCOPY ENDOSCOPY  2013   LASIK  2008   PLANTAR FASCIA SURGERY Left 2018   TONSILLECTOMY     TUBAL LIGATION  1999    There were no vitals filed for this visit.   Subjective Assessment - 07/22/21 1346     Subjective "better" Pt reports less pain and tightness from taking steroids    Currently in Pain? Yes    Pain Score 1     Pain Location Ankle    Pain Orientation Right    Pain Descriptors / Indicators Sharp;Sore                               [facility] Adult PT Treatment/Exercise - 07/22/21 0001       Ambulation/Gait   Gait Comments gait outside aroud back building      Ankle Exercises: Aerobic   Elliptical L2 x 3 min    Tread Mill RLE pushes 3x10    Recumbent Bike L3 x 4 min      Ankle Exercises: Standing   Heel Raises Both;3 seconds;20 reps   3 sec tempo     Ankle Exercises: Stretches   Gastroc Stretch 4  reps   RLE, 15 seconds     Ankle Exercises: Machines for Strengthening   Cybex Leg Press RLE 20lb sit fit  2x10, RLE heel raise 20lb 2x10      Ankle Exercises: Seated   Other Seated Ankle Exercises PF & DF blue band 2x15                       PT Short Term Goals - 07/14/21 1002       PT SHORT TERM GOAL #1   Title Pt will be independent with initial HEP.    Status Achieved               PT Long Term Goals - 07/14/21 1002       PT LONG TERM GOAL #1   Title Pt will be independent with advanced HEP.    Status On-going      PT LONG TERM GOAL #3   Title Pt will be able to  return to shorter hiking trails without an increase of R foot pain.    Baseline increased to 2 miles on walking path    Status On-going      PT LONG TERM GOAL #4   Title Increase R foot strength to 4+/5 to allow pt to negotiate steps..    Baseline PF still weaker than 4+/5    Status Partially Met      PT LONG TERM GOAL #5   Title Pt will be able to negotiate a flight of stairs with reciprocal gait pattern without increased pain to allow her to do laundry in her basement.    Baseline a little stiff with DF on descent    Status Partially Met                   Plan - 07/22/21 1424     Clinical Impression Statement Improved mobility and decrease swelling after taken the steroids. R plantar flexion remains very weak with decrease push off with ambulation. No report of pain. Some burning in R calf with SL heel raises on leg press. No reports of pain. Pt is still unable to do a SL heel raise with RLE. Reports more mobility at home.    Examination-Participation Restrictions Yard Work    Stability/Clinical Decision Making Stable/Uncomplicated    Rehab Potential Good    PT Frequency 2x / week    PT Duration 8 weeks    PT Treatment/Interventions ADLs/Self Care Home Management;Cryotherapy;Electrical Stimulation;Iontophoresis 59m/ml Dexamethasone;Moist Heat;Ultrasound;Gait training;Stair  training;Functional mobility training;Therapeutic activities;Therapeutic exercise;Balance training;Patient/family education;Manual techniques;Scar mobilization;Passive range of motion;Dry needling;Taping;Vasopneumatic Device;Joint Manipulations    PT Next Visit Plan continue SL/Dbl leg heel raise and eccentric quad strengthening             Patient will benefit from skilled therapeutic intervention in order to improve the following deficits and impairments:  Decreased range of motion, Difficulty walking, Increased muscle spasms, Decreased activity tolerance, Pain, Decreased balance, Decreased strength, Increased edema  Visit Diagnosis: Pain in joint involving right ankle and foot  Difficulty in walking, not elsewhere classified  Stiffness of right ankle, not elsewhere classified  Muscle weakness (generalized)  Localized edema     Problem List Patient Active Problem List   Diagnosis Date Noted   Abnormal mammogram of left breast 07/13/2021   Epidermoid cyst of skin 08/06/2019   Intractable right heel pain 05/09/2019   Peroneal tendinitis of right lower leg 02/28/2019   Well woman exam without gynecological exam 07/31/2018   Vitamin D deficiency 09/01/2017   Iron deficiency 07/08/2017   Hypothyroidism 05/27/2017   Midline cystocele 10/31/2014   Urethrocele 10/31/2014   Coccyx pain 10/17/2014   Proteinuria 10/17/2014   Rectocele 10/17/2014   OTHER CONSTIPATION 01/13/2010    RScot Jun PTA 07/22/2021, 2:28 PM  [place] [address]Phone: [phone]  Fax:  [phone] Name: MTWANA WILEMANMRN: [id number] of Birth: 305-Nov-1967

## 2021-07-28 ENCOUNTER — Ambulatory Visit: Payer: Federal, State, Local not specified - PPO | Admitting: Podiatry

## 2021-07-29 ENCOUNTER — Other Ambulatory Visit: Payer: Self-pay

## 2021-07-29 ENCOUNTER — Ambulatory Visit: Payer: Federal, State, Local not specified - PPO | Admitting: Physical Therapy

## 2021-07-29 ENCOUNTER — Encounter: Payer: Self-pay | Admitting: Physical Therapy

## 2021-07-29 DIAGNOSIS — M25671 Stiffness of right ankle, not elsewhere classified: Secondary | ICD-10-CM

## 2021-07-29 DIAGNOSIS — M25571 Pain in right ankle and joints of right foot: Secondary | ICD-10-CM | POA: Diagnosis not present

## 2021-07-29 DIAGNOSIS — R262 Difficulty in walking, not elsewhere classified: Secondary | ICD-10-CM

## 2021-07-29 DIAGNOSIS — R600 Localized edema: Secondary | ICD-10-CM

## 2021-07-29 DIAGNOSIS — M6281 Muscle weakness (generalized): Secondary | ICD-10-CM | POA: Diagnosis not present

## 2021-07-29 NOTE — Therapy (Signed)
[place]. [place], [place], [address] Phone: [phone]   Fax:  [phone]  Physical Therapy Treatment  Patient Details  Name: Sabrina Mcdaniel MRN: [id number] Date of Birth: 04/23/1966 Referring Provider (PT): Dr Tyson Dense   Encounter Date: 07/29/2021   PT End of Session - 07/29/21 1425     Visit Number 9    Date for PT Re-Evaluation 08/14/21    PT Start Time 1345    PT Stop Time 1426    PT Time Calculation (min) 41 min    Activity Tolerance Patient tolerated treatment well    Behavior During Therapy [facility] for tasks assessed/performed             Past Medical History:  Diagnosis Date   Anemia    Iron deficiency   B12 deficiency    Cholelithiasis    Depression    GERD (gastroesophageal reflux disease)    Hiatal hernia 2014   Migraines    Osteopenia    Thyroid disease    hypo    Past Surgical History:  Procedure Laterality Date   ABDOMINAL HYSTERECTOMY  2009   anterior and posterior repair   COLONOSCOPY  2011   hyperplastic polyps   ESOPHAGOGASTRODUODENOSCOPY ENDOSCOPY  2013   LASIK  2008   PLANTAR FASCIA SURGERY Left 2018   TONSILLECTOMY     TUBAL LIGATION  1999    There were no vitals filed for this visit.   Subjective Assessment - 07/29/21 1348     Subjective Doing good, walked 4 miles Sunday but has some fatigue afterwards.    Currently in Pain? No/denies                               [facility] Adult PT Treatment/Exercise - 07/29/21 0001       Ambulation/Gait   Gait Comments gait outside [employer] back building      High Level Balance   High Level Balance Comments SLS on airex with cone taps 3 sets 3 reps.      Ankle Exercises: Aerobic   Recumbent Bike L3 x 6 min      Ankle Exercises: Machines for Strengthening   Cybex Leg Press RLE 30lb 3x10      Ankle Exercises: Seated   Other Seated Ankle Exercises S2S LE on aires OHP blue ball 3x10      Ankle  Exercises: Standing   Heel Raises Both;3 seconds;20 reps    Other Standing Ankle Exercises Toe & Heel walking      Ankle Exercises: Stretches   Gastroc Stretch 3 reps;20 seconds;10 seconds   fitter press                      PT Short Term Goals - 07/14/21 1002       PT SHORT TERM GOAL #1   Title Pt will be independent with initial HEP.    Status Achieved               PT Long Term Goals - 07/29/21 1426       PT LONG TERM GOAL #1   Title Pt will be independent with advanced HEP.    Status Achieved      PT LONG TERM GOAL #2   Title Increase FOTO to 70% to allow pt to be more independent with hobbies.    Status On-going  PT LONG TERM GOAL #3   Title Pt will be able to return to shorter hiking trails without an increase of R foot pain.    Status On-going      PT LONG TERM GOAL #4   Title Increase R foot strength to 4+/5 to allow pt to negotiate steps..    Status Partially Met      PT LONG TERM GOAL #5   Title Pt will be able to negotiate a flight of stairs with reciprocal gait pattern without increased pain to allow her to do laundry in her basement.    Status Partially Met                   Plan - 07/29/21 1426     Clinical Impression Statement Pt is doing well and is progressing towards goals.Pt was very pleased that she could complete toe walking. No issues with outdoor ambulation. Increase resistance tolerated with SL heel raises on leg press.    Examination-Participation Restrictions Yard Work    Stability/Clinical Decision Making Stable/Uncomplicated    Rehab Potential Good    PT Frequency 2x / week    PT Duration 8 weeks    PT Treatment/Interventions ADLs/Self Care Home Management;Cryotherapy;Electrical Stimulation;Iontophoresis 38m/ml Dexamethasone;Moist Heat;Ultrasound;Gait training;Stair training;Functional mobility training;Therapeutic activities;Therapeutic exercise;Balance training;Patient/family education;Manual  techniques;Scar mobilization;Passive range of motion;Dry needling;Taping;Vasopneumatic Device;Joint Manipulations    PT Next Visit Plan continue SL/Dbl leg heel raise and eccentric quad strengthening             Patient will benefit from skilled therapeutic intervention in order to improve the following deficits and impairments:  Decreased range of motion, Difficulty walking, Increased muscle spasms, Decreased activity tolerance, Pain, Decreased balance, Decreased strength, Increased edema  Visit Diagnosis: Pain in joint involving right ankle and foot  Muscle weakness (generalized)  Stiffness of right ankle, not elsewhere classified  Difficulty in walking, not elsewhere classified  Localized edema     Problem List Patient Active Problem List   Diagnosis Date Noted   Abnormal mammogram of left breast 07/13/2021   Epidermoid cyst of skin 08/06/2019   Intractable right heel pain 05/09/2019   Peroneal tendinitis of right lower leg 02/28/2019   Well woman exam without gynecological exam 07/31/2018   Vitamin D deficiency 09/01/2017   Iron deficiency 07/08/2017   Hypothyroidism 05/27/2017   Midline cystocele 10/31/2014   Urethrocele 10/31/2014   Coccyx pain 10/17/2014   Proteinuria 10/17/2014   Rectocele 10/17/2014   OTHER CONSTIPATION 01/13/2010    RScot Jun PTA 07/29/2021, 2:29 PM  [place] [address]Phone: [phone]  Fax:  [phone] Name: Sabrina LADSONMRN: [id number] of Birth: 3November 10, 1967

## 2021-08-06 ENCOUNTER — Ambulatory Visit: Payer: Federal, State, Local not specified - PPO | Admitting: Physical Therapy

## 2021-08-11 ENCOUNTER — Other Ambulatory Visit: Payer: Self-pay

## 2021-08-11 ENCOUNTER — Ambulatory Visit: Payer: Federal, State, Local not specified - PPO | Admitting: Physical Therapy

## 2021-08-11 ENCOUNTER — Encounter: Payer: Self-pay | Admitting: Physical Therapy

## 2021-08-11 DIAGNOSIS — R262 Difficulty in walking, not elsewhere classified: Secondary | ICD-10-CM

## 2021-08-11 DIAGNOSIS — M6281 Muscle weakness (generalized): Secondary | ICD-10-CM | POA: Diagnosis not present

## 2021-08-11 DIAGNOSIS — M25671 Stiffness of right ankle, not elsewhere classified: Secondary | ICD-10-CM | POA: Diagnosis not present

## 2021-08-11 DIAGNOSIS — M25571 Pain in right ankle and joints of right foot: Secondary | ICD-10-CM | POA: Diagnosis not present

## 2021-08-11 DIAGNOSIS — R600 Localized edema: Secondary | ICD-10-CM

## 2021-08-11 NOTE — Therapy (Signed)
[place]. [place], [place], [address] Phone: [phone]   Fax:  [phone]  Physical Therapy Treatment  Patient Details  Name: Sabrina Mcdaniel MRN: [id number] Date of Birth: [birth year] Referring Provider (PT): Dr Tyson Dense   Encounter Date: 08/11/2021   PT End of Session - 08/11/21 1355     Visit Number 10    Date for PT Re-Evaluation 09/13/21    PT Start Time 1310    PT Stop Time 1352    PT Time Calculation (min) 42 min    Activity Tolerance Patient tolerated treatment well    Behavior During Therapy [facility] for tasks assessed/performed             Past Medical History:  Diagnosis Date   Anemia    Iron deficiency   B12 deficiency    Cholelithiasis    Depression    GERD (gastroesophageal reflux disease)    Hiatal hernia 2014   Migraines    Osteopenia    Thyroid disease    hypo    Past Surgical History:  Procedure Laterality Date   ABDOMINAL HYSTERECTOMY  2009   anterior and posterior repair   COLONOSCOPY  2011   hyperplastic polyps   ESOPHAGOGASTRODUODENOSCOPY ENDOSCOPY  2013   LASIK  2008   PLANTAR FASCIA SURGERY Left 2018   TONSILLECTOMY     TUBAL LIGATION  1999    There were no vitals filed for this visit.   Subjective Assessment - 08/11/21 1336     Subjective Patient reports that she did a 3.5 mile walk, easy terrain, no issue that day, but the next day pain a 6-7/10 along the dorsal medial foot.  She reports better over the past week but still pain a2/10 with walk at toe push off    Currently in Pain? No/denies                               [facility] Adult PT Treatment/Exercise - 08/11/21 0001       Modalities   Modalities Iontophoresis      Iontophoresis   Type of Iontophoresis Dexamethasone    Location right dorsal 1st metatarsal    Dose 53m    Time 4 hour patch      Manual Therapy   Manual Therapy Joint mobilization;Soft tissue mobilization;Passive  ROM    Joint Mobilization talocrural; talofib and tibiotalar mobs post/ant; prox tib/fib post, metatarsal and phalanges as well    Soft tissue mobilization tot he top of the right foot    Passive ROM ankle to end range and stretch, great toe and other toes                       PT Short Term Goals - 07/14/21 1002       PT SHORT TERM GOAL #1   Title Pt will be independent with initial HEP.    Status Achieved               PT Long Term Goals - 08/11/21 1358       PT LONG TERM GOAL #1   Title Pt will be independent with advanced HEP.    Status Achieved      PT LONG TERM GOAL #2   Title Increase FOTO to 70% to allow pt to be more independent with hobbies.    Status Partially Met  PT LONG TERM GOAL #3   Title Pt will be able to return to shorter hiking trails without an increase of R foot pain.    Status Partially Met      PT LONG TERM GOAL #4   Title Increase R foot strength to 4+/5 to allow pt to negotiate steps..    Status Partially Met                   Plan - 08/11/21 1355     Clinical Impression Statement Patient comes in and is limping visibly at mid stance and toe off phase of gait, she reports that 2 weekends ago she went for a hike about 3.5 miles reports some inclines and declines but level surface, rreports no issues during the walk or that night but reports that the next day much more pain in the doral great toe ankle area.  She reports that she had some swelling here, she can still raise up on her toes, i did not like that she was limping and expressed my concern of her continuing to walk with the compensation that she is doing and how this could cause other issues, I really worked on joint mobs and PROM of the ankle mid foot and toes.  I added ionto to see if this woul help as it could be an inflammation    PT Frequency 2x / week    PT Duration 8 weeks    PT Treatment/Interventions ADLs/Self Care Home Management;Cryotherapy;Electrical  Stimulation;Iontophoresis 80m/ml Dexamethasone;Moist Heat;Ultrasound;Gait training;Stair training;Functional mobility training;Therapeutic activities;Therapeutic exercise;Balance training;Patient/family education;Manual techniques;Scar mobilization;Passive range of motion;Dry needling;Taping;Vasopneumatic Device;Joint Manipulations    PT Next Visit Plan seems to have had a flare up, i tried ionto today and more manual to assure the ROM in the ankle, mid foot and toes, asked her to limit walking while she has such a limp    Consulted and Agree with Plan of Care Patient             Patient will benefit from skilled therapeutic intervention in order to improve the following deficits and impairments:  Decreased range of motion, Difficulty walking, Increased muscle spasms, Decreased activity tolerance, Pain, Decreased balance, Decreased strength, Increased edema  Visit Diagnosis: Pain in joint involving right ankle and foot - Plan: PT plan of care cert/re-cert  Muscle weakness (generalized) - Plan: PT plan of care cert/re-cert  Stiffness of right ankle, not elsewhere classified - Plan: PT plan of care cert/re-cert  Difficulty in walking, not elsewhere classified - Plan: PT plan of care cert/re-cert  Localized edema - Plan: PT plan of care cert/re-cert     Problem List Patient Active Problem List   Diagnosis Date Noted   Abnormal mammogram of left breast 07/13/2021   Epidermoid cyst of skin 08/06/2019   Intractable right heel pain 05/09/2019   Peroneal tendinitis of right lower leg 02/28/2019   Well woman exam without gynecological exam 07/31/2018   Vitamin D deficiency 09/01/2017   Iron deficiency 07/08/2017   Hypothyroidism 05/27/2017   Midline cystocele 10/31/2014   Urethrocele 10/31/2014   Coccyx pain 10/17/2014   Proteinuria 10/17/2014   Rectocele 10/17/2014   OTHER CONSTIPATION 01/13/2010    ASumner Boast PT 08/11/2021, 2:02 PM  [place] [address]Phone: [phone]  Fax:  [phone] Name: Sabrina WISINSKIMRN: [id number] of Birth: 3June 17, 1967

## 2021-08-20 ENCOUNTER — Ambulatory Visit: Payer: Federal, State, Local not specified - PPO | Admitting: Podiatry

## 2021-08-20 ENCOUNTER — Other Ambulatory Visit: Payer: Self-pay

## 2021-08-20 ENCOUNTER — Encounter: Payer: Self-pay | Admitting: Podiatry

## 2021-08-20 DIAGNOSIS — M722 Plantar fascial fibromatosis: Secondary | ICD-10-CM | POA: Diagnosis not present

## 2021-08-20 DIAGNOSIS — Z9889 Other specified postprocedural states: Secondary | ICD-10-CM

## 2021-08-20 DIAGNOSIS — M7671 Peroneal tendinitis, right leg: Secondary | ICD-10-CM

## 2021-08-20 NOTE — Progress Notes (Signed)
She presents today date of surgery 03/27/2021 repair of peroneal tendon and an endoscopic fasciotomy of the right foot with cast.  She states that she was doing really well and after physical therapy she felt that she could go hiking so she went hiking and may have overdone it a little bit she states that initially went away very nicely but the second hike caused it to be around for the past several days.  She states that it may be starting to get a little better but all in all it is still tender.  Objective: Vital signs are stable she is alert and oriented x3 there is no edema no erythema cellulitis drainage or odor she has great range of motion dorsiflexion plantarflexion inversion eversion she has no crepitation on range of motion of the ankle joint and she has no pain on palpation of the peroneal tendon or of the plantar fascial.  assessment: Well-healing peroneal tendon with overuse of the ankle and most likely some compensatory issues.  Plan: Encouraged her to continue physical therapy and will follow-up with me on an as-needed basis.

## 2021-08-23 ENCOUNTER — Encounter: Payer: Self-pay | Admitting: Podiatry

## 2021-08-24 ENCOUNTER — Other Ambulatory Visit: Payer: Self-pay | Admitting: Podiatry

## 2021-08-24 MED ORDER — METHYLPREDNISOLONE 4 MG PO TBPK: ORAL_TABLET | ORAL | 0 refills | Status: DC

## 2021-08-25 ENCOUNTER — Ambulatory Visit
Admission: RE | Admit: 2021-08-25 | Discharge: 2021-08-25 | Disposition: A | Discharge status: Home / Self Care | Payer: Federal, State, Local not specified - PPO | Source: Ambulatory Visit | Attending: Family Medicine | Admitting: Family Medicine

## 2021-08-25 ENCOUNTER — Ambulatory Visit: Payer: Federal, State, Local not specified - PPO

## 2021-08-25 DIAGNOSIS — R928 Other abnormal and inconclusive findings on diagnostic imaging of breast: Secondary | ICD-10-CM | POA: Diagnosis not present

## 2021-08-25 DIAGNOSIS — R922 Inconclusive mammogram: Secondary | ICD-10-CM | POA: Diagnosis not present

## 2021-08-26 ENCOUNTER — Encounter: Payer: Self-pay | Admitting: Family Medicine

## 2021-08-26 ENCOUNTER — Other Ambulatory Visit: Payer: Self-pay

## 2021-08-26 ENCOUNTER — Ambulatory Visit: Payer: Federal, State, Local not specified - PPO | Admitting: Family Medicine

## 2021-08-26 VITALS — BP 122/72 | HR 60 | Temp 96.7°F | Ht 63.39 in | Wt 187.4 lb

## 2021-08-26 DIAGNOSIS — E039 Hypothyroidism, unspecified: Secondary | ICD-10-CM | POA: Diagnosis not present

## 2021-08-26 DIAGNOSIS — E559 Vitamin D deficiency, unspecified: Secondary | ICD-10-CM | POA: Diagnosis not present

## 2021-08-26 DIAGNOSIS — Z86010 Personal history of colonic polyps: Secondary | ICD-10-CM

## 2021-08-26 DIAGNOSIS — E611 Iron deficiency: Secondary | ICD-10-CM

## 2021-08-26 DIAGNOSIS — E538 Deficiency of other specified B group vitamins: Secondary | ICD-10-CM | POA: Insufficient documentation

## 2021-08-26 DIAGNOSIS — Z8639 Personal history of other endocrine, nutritional and metabolic disease: Secondary | ICD-10-CM

## 2021-08-26 DIAGNOSIS — K219 Gastro-esophageal reflux disease without esophagitis: Secondary | ICD-10-CM | POA: Insufficient documentation

## 2021-08-26 MED ORDER — VITAMIN D (ERGOCALCIFEROL) 1.25 MG (50000 UNIT) PO CAPS: 50000.0000 [IU] | ORAL_CAPSULE | ORAL | 3 refills | Status: DC

## 2021-08-26 MED ORDER — CYANOCOBALAMIN 1000 MCG/ML IJ SOLN: INTRAMUSCULAR | 3 refills | Status: DC

## 2021-08-26 NOTE — Progress Notes (Signed)
[facility] [address] [place] [address] Dept: [phone] Dept Fax: [phone]  Transfer of Care Office Visit  Subjective:    Patient ID: Sabrina Mcdaniel, female    DOB: Mar 02, 1966, 55 y.o..   MRN: [id number]  Chief Complaint  Patient presents with   [place]. Need for medications. No other complaints     History of Present Illness:  Patient is in today to establish care. Ms. Warbington was born in [place], [place]. Her father was a Company secretary, so the family moved multiple times, eventually settling in [place]. Ms. Neidlinger attended [employer] in business administration. She has worked for [employer] for the past 31 years. 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 Ms. Beidleman has been married for 31 years. She has 2 sons (79, 76). She denies any alcohol, tobacco, or drug use.  Ms. Hochberg has a history of hypothyroidism. She is managed on levothyroxine.  Ms. Saravia has a history of GERD. She is on Nexium. She previously did try and stop the medication. She experienced some acid rebound, but the reflux remained persistent.  Ms. Sherfield has a history of Vitamin B12 deficiency. She was tried on oral B12, but her deficiency returned. She now takes monthly injections.  Ms. Filice has a history of Vitamin D deficiency. She is on a replacement dose of Vitamin D. When she has tried to manage with a daily OTC supplement, her Vitamin D levels have become low again.  Ms. Trachtenberg had a peroneal tendon repair and endoscopic plantar fascia release on July 8th. She has been dealing with some flare of pain in the foot over the past 1-2 months. She is currently on a course of steroids for this and seeing some improvements.  Past Medical History: Patient Active Problem List   Diagnosis Date Noted   History of non anemic vitamin B12 deficiency 08/26/2021   Abnormal mammogram of left  breast 07/13/2021   Epidermoid cyst of skin 08/06/2019   Peroneal tendinitis of right lower leg 02/28/2019   Vitamin D deficiency 09/01/2017   Iron deficiency 07/08/2017   Hypothyroidism 05/27/2017   Midline cystocele 10/31/2014   Urethrocele 10/31/2014   Coccyx pain 10/17/2014   Proteinuria 10/17/2014   Rectocele 10/17/2014   Past Surgical History:  Procedure Laterality Date   ABDOMINAL HYSTERECTOMY  2009   anterior and posterior repair   COLONOSCOPY  2011   hyperplastic polyps   ESOPHAGOGASTRODUODENOSCOPY ENDOSCOPY  2013   LASIK  2008   PLANTAR FASCIA SURGERY Left 2018   REPAIR PERONEAL TENDONS ANKLE Right 03/27/2021   TONSILLECTOMY  2000   TUBAL LIGATION  1999   Family History  Problem Relation Age of Onset   Cancer Mother 27       breast    Hypothyroidism Mother    Heart disease Father    Diabetes Father    Cancer Father        Acute myelogenous leukemia   Colon cancer Maternal Aunt    Stroke Maternal Grandmother    Cancer Maternal Grandfather        Prostate   Heart disease Paternal Grandmother    Stroke Paternal Grandfather    Colon polyps Neg Hx    Esophageal cancer Neg Hx    Rectal cancer Neg Hx    Stomach cancer Neg Hx    Outpatient Medications Prior to Visit  Medication Sig Dispense Refill  methylPREDNISolone (MEDROL DOSEPAK) 4 MG TBPK tablet Take as directed 21 tablet 0   Prenatal Vit-Fe Fumarate-FA (PRENATAL COMPLETE PO) Take by mouth.     cyanocobalamin (,VITAMIN B-12,) 1000 MCG/ML injection Use as directed 3 mL 3   Vitamin D, Ergocalciferol, (DRISDOL) 1.25 MG (50000 UNIT) CAPS capsule Take 1 capsule (50,000 Units total) by mouth every 7 (seven) days. 12 capsule 3   esomeprazole (NEXIUM) 20 MG capsule Take 20 mg by mouth daily at 12 noon.     levothyroxine (SYNTHROID) 75 MCG tablet Take 1 tablet by mouth daily. 90 tablet 3   No facility-administered medications prior to visit.   No Known Allergies    Objective:   Today's Vitals   08/26/21  1507  BP: 122/72  Pulse: 60  Temp: (!) 96.7 F (35.9 C)  SpO2: 95%  Weight: 187 lb 6.4 oz (85 kg)  Height: 5' 3.39" (1.61 m)   Body mass index is 32.79 kg/m.   General: Well developed, well nourished. No acute distress. Psych: Alert and oriented. Normal mood and affect.  Health Maintenance Due  Topic Date Due   HIV Screening  Never done   Hepatitis C Screening  Never done   COVID-19 Vaccine (3 - Booster for Pfizer series) 02/28/2020   Lab Results Last CBC Lab Results  Component Value Date   WBC 5.2 08/07/2020   HGB 13.9 08/07/2020   HCT 42.2 08/07/2020   MCV 86.1 08/07/2020   MCH 27.8 08/29/2016   RDW 13.9 08/07/2020   PLT 263.0 28/78/6767   Last metabolic panel Lab Results  Component Value Date   GLUCOSE 92 08/07/2020   NA 140 08/07/2020   K 5.0 08/07/2020   CL 104 08/07/2020   CO2 29 08/07/2020   BUN 12 08/07/2020   CREATININE 0.84 08/07/2020   GFRNONAA 56 (L) 08/29/2016   CALCIUM 10.0 08/07/2020   PROT 7.5 08/07/2020   ALBUMIN 4.5 08/07/2020   BILITOT 0.4 08/07/2020   ALKPHOS 107 08/07/2020   AST 24 08/07/2020   ALT 40 (H) 08/07/2020   ANIONGAP 9 08/29/2016   Last lipids Lab Results  Component Value Date   CHOL 196 08/07/2020   HDL 59.20 08/07/2020   LDLCALC 116 (H) 08/07/2020   TRIG 105.0 08/07/2020   CHOLHDL 3 08/07/2020   Last thyroid functions Lab Results  Component Value Date   TSH 2.60 08/07/2020   T3TOTAL 99 06/27/2019   Last vitamin D Lab Results  Component Value Date   VD25OH 37.74 08/06/2019   Last vitamin B12 and Folate Lab Results  Component Value Date   VITAMINB12 328 08/06/2019   FOLATE 17.8 05/06/2017     Assessment & Plan:   1. Hypothyroidism, unspecified type Due for repeat TSH. I will await the results before renewing her levothyroxine.  - TSH  2. Iron deficiency Past issues with low iron. I will screen for anemia.  - CBC  3. Vitamin D deficiency I will reassess the Vitamin D level. Plan to continue  replacement dose due to past failure on maintenance therapy.  - VITAMIN D 25 Hydroxy (Vit-D Deficiency, Fractures) - Vitamin D, Ergocalciferol, (DRISDOL) 1.25 MG (50000 UNIT) CAPS capsule; Take 1 capsule (50,000 Units total) by mouth every 7 (seven) days.  Dispense: 12 capsule; Refill: 3  4. History of non anemic vitamin B12 deficiency I will recheck her B12 level. Plan to continue IM injecitons.  - Vitamin B12 - cyanocobalamin (,VITAMIN B-12,) 1000 MCG/ML injection; Use as directed  Dispense: 3 mL;  Refill: 3  Haydee Salter, MD

## 2021-08-27 ENCOUNTER — Ambulatory Visit: Payer: Federal, State, Local not specified - PPO | Admitting: Podiatry

## 2021-08-27 ENCOUNTER — Ambulatory Visit: Payer: Federal, State, Local not specified - PPO | Attending: Podiatry | Admitting: Physical Therapy

## 2021-08-27 ENCOUNTER — Encounter: Payer: Self-pay | Admitting: Physical Therapy

## 2021-08-27 DIAGNOSIS — R600 Localized edema: Secondary | ICD-10-CM

## 2021-08-27 DIAGNOSIS — R262 Difficulty in walking, not elsewhere classified: Secondary | ICD-10-CM | POA: Diagnosis not present

## 2021-08-27 DIAGNOSIS — M25671 Stiffness of right ankle, not elsewhere classified: Secondary | ICD-10-CM

## 2021-08-27 DIAGNOSIS — M25571 Pain in right ankle and joints of right foot: Secondary | ICD-10-CM | POA: Diagnosis not present

## 2021-08-27 DIAGNOSIS — M6281 Muscle weakness (generalized): Secondary | ICD-10-CM | POA: Diagnosis not present

## 2021-08-27 DIAGNOSIS — R252 Cramp and spasm: Secondary | ICD-10-CM | POA: Diagnosis not present

## 2021-08-27 DIAGNOSIS — M542 Cervicalgia: Secondary | ICD-10-CM | POA: Insufficient documentation

## 2021-08-27 LAB — TSH: TSH: 0.72 u[IU]/mL (ref 0.35–5.50)

## 2021-08-27 LAB — VITAMIN B12: Vitamin B-12: 305 pg/mL (ref 211–911)

## 2021-08-27 LAB — VITAMIN D 25 HYDROXY (VIT D DEFICIENCY, FRACTURES): VITD: 57.67 ng/mL (ref 30.00–100.00)

## 2021-08-27 LAB — CBC
HCT: 42.6 % (ref 36.0–46.0)
Hemoglobin: 13.6 g/dL (ref 12.0–15.0)
MCHC: 32 g/dL (ref 30.0–36.0)
MCV: 87.6 fl (ref 78.0–100.0)
Platelets: 267 10*3/uL (ref 150.0–400.0)
RBC: 4.86 Mil/uL (ref 3.87–5.11)
RDW: 14.1 % (ref 11.5–15.5)
WBC: 10.4 10*3/uL (ref 4.0–10.5)

## 2021-08-27 NOTE — Therapy (Signed)
[place]. [place], [place], [address] Phone: [phone]   Fax:  [phone]  Physical Therapy Treatment  Patient Details  Name: Sabrina Mcdaniel MRN: [id number] Date of Birth: 1966-06-04 Referring Provider (PT): Dr Tyson Dense   Encounter Date: 08/27/2021   PT End of Session - 08/27/21 1406     Visit Number 11    Date for PT Re-Evaluation 09/13/21    PT Start Time 1311    PT Stop Time 1357    PT Time Calculation (min) 46 min    Activity Tolerance Patient tolerated treatment well    Behavior During Therapy [facility] for tasks assessed/performed             Past Medical History:  Diagnosis Date   Anemia    Iron deficiency   B12 deficiency    Cholelithiasis    Depression    GERD (gastroesophageal reflux disease)    Hiatal hernia 2014   Menopause 2012   Migraines    Osteopenia    Plantar fasciitis 2017   Thyroid disease    hypo   Vitamin D deficiency     Past Surgical History:  Procedure Laterality Date   ABDOMINAL HYSTERECTOMY  2009   anterior and posterior repair   COLONOSCOPY  2011   hyperplastic polyps   ESOPHAGOGASTRODUODENOSCOPY ENDOSCOPY  2013   LASIK  2008   PLANTAR FASCIA SURGERY Left 2018   REPAIR PERONEAL TENDONS ANKLE Right 03/27/2021   TONSILLECTOMY  2000   TUBAL LIGATION  1999    There were no vitals filed for this visit.                      [facility] Adult PT Treatment/Exercise - 08/27/21 0001       Ambulation/Gait   Gait Comments still with significant limp at toe off      Modalities   Modalities Ultrasound      Ultrasound   Ultrasound Location right dorsal ankle    Ultrasound Parameters 100% 1.3w/cm 100%    Ultrasound Goals Pain      Iontophoresis   Type of Iontophoresis Dexamethasone    Location right dorsal 1st metatarsal    Dose 21m    Time 4 hour patch      Manual Therapy   Manual Therapy Joint mobilization;Soft tissue mobilization;Passive ROM     Manual therapy comments used IASTM along the anterior tib area and over the tendons of the extensors    Joint Mobilization talocrural; talofib and tibiotalar mobs post/ant; prox tib/fib post, metatarsal and phalanges as well    Soft tissue mobilization tot he top of the right foot    Passive ROM ankle to end range and stretch, great toe and other toes                     PT Education - 08/27/21 1405     Education Details Advised patient to skip days on the HEP, as she reports that she is doing a lot multiple times daily, asked her to allow rest    Person(s) Educated Patient    Methods Explanation              PT Short Term Goals - 07/14/21 1002       PT SHORT TERM GOAL #1   Title Pt will be independent with initial HEP.    Status Achieved  PT Long Term Goals - 08/27/21 1408       PT LONG TERM GOAL #1   Title Pt will be independent with advanced HEP.    Status Achieved      PT LONG TERM GOAL #2   Title Increase FOTO to 70% to allow pt to be more independent with hobbies.    Status Partially Met      PT LONG TERM GOAL #3   Title Pt will be able to return to shorter hiking trails without an increase of R foot pain.    Status Partially Met      PT LONG TERM GOAL #4   Title Increase R foot strength to 4+/5 to allow pt to negotiate steps..    Status Partially Met                   Plan - 08/27/21 1406     Clinical Impression Statement [name] reports that after the flare up the ankle has continued to have issues, she saw the surgeon and he reported all is well and that he felt it was a flare up.  I aksed her to back off of HEP and really worked on a very tight anterior tib mm. with IASTM, i did a lot of joint mobs of the foot and ankle.  Did [place] and [place] to see if this would help,  i feel that she was doing and is doing too much and that we need to do more STM, joint mobs and modalities to help this    PT Next Visit Plan work on  the tight anterior tib, could do DN    Consulted and Agree with Plan of Care Patient             Patient will benefit from skilled therapeutic intervention in order to improve the following deficits and impairments:  Decreased range of motion, Difficulty walking, Increased muscle spasms, Decreased activity tolerance, Pain, Decreased balance, Decreased strength, Increased edema  Visit Diagnosis: Pain in joint involving right ankle and foot  Muscle weakness (generalized)  Stiffness of right ankle, not elsewhere classified  Difficulty in walking, not elsewhere classified  Localized edema     Problem List Patient Active Problem List   Diagnosis Date Noted   History of non anemic vitamin B12 deficiency 08/26/2021   Gastroesophageal reflux disease 08/26/2021   History of colon polyps 08/26/2021   Abnormal mammogram of left breast 07/13/2021   Epidermoid cyst of skin 08/06/2019   Peroneal tendinitis of right lower leg 02/28/2019   Vitamin D deficiency 09/01/2017   Iron deficiency 07/08/2017   Hypothyroidism 05/27/2017   Midline cystocele 10/31/2014   Urethrocele 10/31/2014   Coccyx pain 10/17/2014   Proteinuria 10/17/2014   Rectocele 10/17/2014    Sumner Boast, PT 08/27/2021, 2:09 PM  [place]. [place], [place], [address] Phone: [phone]   Fax:  [phone]  Name: Sabrina Mcdaniel MRN: [id number] Date of Birth: 01-05-1966

## 2021-08-28 ENCOUNTER — Telehealth: Payer: Self-pay | Admitting: Family Medicine

## 2021-08-28 DIAGNOSIS — E039 Hypothyroidism, unspecified: Secondary | ICD-10-CM

## 2021-08-28 MED ORDER — LEVOTHYROXINE SODIUM 75 MCG PO TABS: ORAL_TABLET | ORAL | 3 refills | Status: DC

## 2021-08-28 NOTE — Telephone Encounter (Signed)
Pt said that she needs levothyroxine (SYNTHROID) 75 MCG tablet  refilled. She is out. She said that Dr. Gena Fray was waiting on labs first. Please call  [phone]

## 2021-09-01 ENCOUNTER — Other Ambulatory Visit: Payer: Self-pay

## 2021-09-01 ENCOUNTER — Ambulatory Visit: Payer: Federal, State, Local not specified - PPO | Admitting: Physical Therapy

## 2021-09-01 DIAGNOSIS — M25571 Pain in right ankle and joints of right foot: Secondary | ICD-10-CM | POA: Diagnosis not present

## 2021-09-01 DIAGNOSIS — M25671 Stiffness of right ankle, not elsewhere classified: Secondary | ICD-10-CM

## 2021-09-01 DIAGNOSIS — R262 Difficulty in walking, not elsewhere classified: Secondary | ICD-10-CM | POA: Diagnosis not present

## 2021-09-01 DIAGNOSIS — R252 Cramp and spasm: Secondary | ICD-10-CM | POA: Diagnosis not present

## 2021-09-01 DIAGNOSIS — M542 Cervicalgia: Secondary | ICD-10-CM | POA: Diagnosis not present

## 2021-09-01 DIAGNOSIS — M6281 Muscle weakness (generalized): Secondary | ICD-10-CM | POA: Diagnosis not present

## 2021-09-01 DIAGNOSIS — R600 Localized edema: Secondary | ICD-10-CM | POA: Diagnosis not present

## 2021-09-01 NOTE — Therapy (Signed)
[place]. [place], [place], [address] Phone: [phone]   Fax:  [phone]  Physical Therapy Treatment  Patient Details  Name: Sabrina Mcdaniel MRN: [id number] Date of Birth: May 06, 1966 Referring Provider (PT): Dr Tyson Dense   Encounter Date: 09/01/2021   PT End of Session - 09/01/21 1150     Visit Number 12    Date for PT Re-Evaluation 09/13/21    PT Start Time 58    PT Stop Time 1140    PT Time Calculation (min) 40 min             Past Medical History:  Diagnosis Date   Anemia    Iron deficiency   B12 deficiency    Cholelithiasis    Depression    GERD (gastroesophageal reflux disease)    Hiatal hernia 2014   Menopause 2012   Migraines    Osteopenia    Plantar fasciitis 2017   Thyroid disease    hypo   Vitamin D deficiency     Past Surgical History:  Procedure Laterality Date   ABDOMINAL HYSTERECTOMY  2009   anterior and posterior repair   COLONOSCOPY  2011   hyperplastic polyps   ESOPHAGOGASTRODUODENOSCOPY ENDOSCOPY  2013   LASIK  2008   PLANTAR FASCIA SURGERY Left 2018   REPAIR PERONEAL TENDONS ANKLE Right 03/27/2021   TONSILLECTOMY  2000   TUBAL LIGATION  1999    There were no vitals filed for this visit.   Subjective Assessment - 09/01/21 1143     Subjective feeling better after last session and taking it easier with ex at home is helping    Currently in Pain? No/denies                               [facility] Adult PT Treatment/Exercise - 09/01/21 0001       Ultrasound   Ultrasound Location rt dorsal ankle    Ultrasound Parameters 100% 1.3 w/cm 2    Ultrasound Goals Pain      Iontophoresis   Type of Iontophoresis Dexamethasone    Location rt ant ankle    Dose 88m    Time 4 hour patch      Manual Therapy   Manual Therapy Joint mobilization;Soft tissue mobilization;Passive ROM    Manual therapy comments used IASTM along the anterior tib area and  over the tendons of the extensors    Joint Mobilization talocrural; talofib and tibiotalar mobs post/ant; prox tib/fib post, metatarsal and phalanges as well    Soft tissue mobilization to the top of the right foot    Passive ROM ankle to end range and stretch, great toe and other toes                       PT Short Term Goals - 07/14/21 1002       PT SHORT TERM GOAL #1   Title Pt will be independent with initial HEP.    Status Achieved               PT Long Term Goals - 08/27/21 1408       PT LONG TERM GOAL #1   Title Pt will be independent with advanced HEP.    Status Achieved      PT LONG TERM GOAL #2   Title Increase FOTO to 70% to allow pt to be more  independent with hobbies.    Status Partially Met      PT LONG TERM GOAL #3   Title Pt will be able to return to shorter hiking trails without an increase of R foot pain.    Status Partially Met      PT LONG TERM GOAL #4   Title Increase R foot strength to 4+/5 to allow pt to negotiate steps..    Status Partially Met                   Plan - 09/01/21 1150     Clinical Impression Statement pt good great relief after last session so did same. knot laterally at ankle but gone with STW. audible popping in foot and ankle without pain with mobs but better after. told pt to slowly resume more of HEP but back off if pain starts to return    PT Treatment/Interventions ADLs/Self Care Home Management;Cryotherapy;Electrical Stimulation;Iontophoresis 104m/ml Dexamethasone;Moist Heat;Ultrasound;Gait training;Stair training;Functional mobility training;Therapeutic activities;Therapeutic exercise;Balance training;Patient/family education;Manual techniques;Scar mobilization;Passive range of motion;Dry needling;Taping;Vasopneumatic Device;Joint Manipulations    PT Next Visit Plan assess and progress             Patient will benefit from skilled therapeutic intervention in order to improve the following  deficits and impairments:  Decreased range of motion, Difficulty walking, Increased muscle spasms, Decreased activity tolerance, Pain, Decreased balance, Decreased strength, Increased edema  Visit Diagnosis: Pain in joint involving right ankle and foot  Stiffness of right ankle, not elsewhere classified  Difficulty in walking, not elsewhere classified     Problem List Patient Active Problem List   Diagnosis Date Noted   History of non anemic vitamin B12 deficiency 08/26/2021   Gastroesophageal reflux disease 08/26/2021   History of colon polyps 08/26/2021   Abnormal mammogram of left breast 07/13/2021   Epidermoid cyst of skin 08/06/2019   Peroneal tendinitis of right lower leg 02/28/2019   Vitamin D deficiency 09/01/2017   Iron deficiency 07/08/2017   Hypothyroidism 05/27/2017   Midline cystocele 10/31/2014   Urethrocele 10/31/2014   Coccyx pain 10/17/2014   Proteinuria 10/17/2014   Rectocele 10/17/2014    Denman Pichardo,ANGIE, PTA 09/01/2021, 11:52 AM  [place] [address]Phone: [phone]  Fax:  [phone] Name: Sabrina RAMNATHMRN: [id number] of Birth: 3February 18, 1967

## 2021-09-07 ENCOUNTER — Ambulatory Visit: Payer: Federal, State, Local not specified - PPO | Admitting: Family Medicine

## 2021-09-07 ENCOUNTER — Other Ambulatory Visit: Payer: Self-pay

## 2021-09-07 VITALS — BP 118/78 | HR 59 | Temp 97.9°F | Ht 63.25 in | Wt 190.0 lb

## 2021-09-07 DIAGNOSIS — G8929 Other chronic pain: Secondary | ICD-10-CM

## 2021-09-07 DIAGNOSIS — M549 Dorsalgia, unspecified: Secondary | ICD-10-CM | POA: Diagnosis not present

## 2021-09-07 NOTE — Progress Notes (Signed)
[facility] [address] [place] [address] Dept: [phone] Dept Fax: [phone]  Office Visit  Subjective:    Patient ID: Sabrina Mcdaniel, female    DOB: [birth year], 55 y.o..   MRN: [id number]  Chief Complaint  Patient presents with   Acute Visit    C/o having HA, and RT shoulder pain on/off x 6 months.  She has used chiropractor and Ibuprofen with little relief.      History of Present Illness:  Patient is in today for evaluation of upper back and neck pain. She notes this has been ongoing for longer than 6 months. She has episodes of pain in the right posterolateral neck and down into he right scapular area. The pain can cause stiffness of the neck at times. It also seems to trigger some right-sided headache. She denies any weakness, tingling, or numbness in the right arm. Ms. Bruinsma  works typing on a computer all day. She notes that she has tried ibuprofen, stretching, heat, and massage (both by a massage therapist and using a home massing mat).   Past Medical History: Patient Active Problem List   Diagnosis Date Noted   History of non anemic vitamin B12 deficiency 08/26/2021   Gastroesophageal reflux disease 08/26/2021   History of colon polyps 08/26/2021   Abnormal mammogram of left breast 07/13/2021   Epidermoid cyst of skin 08/06/2019   Peroneal tendinitis of right lower leg 02/28/2019   Vitamin D deficiency 09/01/2017   Iron deficiency 07/08/2017   Hypothyroidism 05/27/2017   Midline cystocele 10/31/2014   Urethrocele 10/31/2014   Coccyx pain 10/17/2014   Proteinuria 10/17/2014   Rectocele 10/17/2014   Past Surgical History:  Procedure Laterality Date   ABDOMINAL HYSTERECTOMY  2009   anterior and posterior repair   COLONOSCOPY  2011   hyperplastic polyps   ESOPHAGOGASTRODUODENOSCOPY ENDOSCOPY  2013   LASIK  2008   PLANTAR FASCIA SURGERY Left 2018   REPAIR PERONEAL TENDONS ANKLE Right 03/27/2021    TONSILLECTOMY  2000   TUBAL LIGATION  1999   Family History  Problem Relation Age of Onset   Cancer Mother 34       breast    Hypothyroidism Mother    Heart disease Father    Diabetes Father    Cancer Father        Acute myelogenous leukemia   Colon cancer Maternal Aunt    Stroke Maternal Grandmother    Cancer Maternal Grandfather        Prostate   Heart disease Paternal Grandmother    Stroke Paternal Grandfather    Colon polyps Neg Hx    Esophageal cancer Neg Hx    Rectal cancer Neg Hx    Stomach cancer Neg Hx    Outpatient Medications Prior to Visit  Medication Sig Dispense Refill   cyanocobalamin (,VITAMIN B-12,) 1000 MCG/ML injection Use as directed 3 mL 3   esomeprazole (NEXIUM) 20 MG capsule Take 20 mg by mouth daily at 12 noon.     ibuprofen (ADVIL) 200 MG tablet Take 200 mg by mouth every 6 (six) hours as needed.     levothyroxine (SYNTHROID) 75 MCG tablet Take 1 tablet by mouth daily. 90 tablet 3   Prenatal Vit-Fe Fumarate-FA (PRENATAL COMPLETE PO) Take by mouth.     Vitamin D, Ergocalciferol, (DRISDOL) 1.25 MG (50000 UNIT) CAPS capsule Take 1 capsule (50,000 Units total) by mouth every 7 (seven) days. 12 capsule 3   methylPREDNISolone (MEDROL  DOSEPAK) 4 MG TBPK tablet Take as directed 21 tablet 0   No facility-administered medications prior to visit.   No Known Allergies    Objective:   Today's Vitals   09/07/21 1507  BP: 118/78  Pulse: (!) 59  Temp: 97.9 F (36.6 C)  TempSrc: Temporal  SpO2: 96%  Weight: 190 lb (86.2 kg)  Height: 5' 3.25" (1.607 m)   Body mass index is 33.39 kg/m.   General: Well developed, well nourished. No acute distress. Neck: Supple. FROM. NO tenderness on palpation at present. Back: Straight. Pain localized over the mid scapular area up towards the shoulder. Extremities: Full ROM. Normal muscle strength. Normal sensation. Psych: Alert and oriented. Normal mood and affect.  Health Maintenance Due  Topic Date Due   HIV  Screening  Never done   Hepatitis C Screening  Never done   COVID-19 Vaccine (3 - Booster for Pfizer series) 02/28/2020     Assessment & Plan:   1. Chronic upper back pain- right trapezious This appears to be some chronic strain of the right trapezius. I support her use of heat, stretches, and massage. I recommend she take frequent micro breaks during the day. I recommend she take Aleve 2 tabs bid for 10 days. She can also had a Salonpas patch to see if this will give some relief. She will speak with her physical therapist tomorrow and ask about therapy for the neck.  Haydee Salter, MD

## 2021-09-08 ENCOUNTER — Ambulatory Visit: Payer: Federal, State, Local not specified - PPO | Admitting: Physical Therapy

## 2021-09-08 ENCOUNTER — Encounter: Payer: Self-pay | Admitting: Physical Therapy

## 2021-09-08 DIAGNOSIS — M6281 Muscle weakness (generalized): Secondary | ICD-10-CM

## 2021-09-08 DIAGNOSIS — R262 Difficulty in walking, not elsewhere classified: Secondary | ICD-10-CM | POA: Diagnosis not present

## 2021-09-08 DIAGNOSIS — M25571 Pain in right ankle and joints of right foot: Secondary | ICD-10-CM

## 2021-09-08 DIAGNOSIS — M25671 Stiffness of right ankle, not elsewhere classified: Secondary | ICD-10-CM | POA: Diagnosis not present

## 2021-09-08 DIAGNOSIS — M542 Cervicalgia: Secondary | ICD-10-CM | POA: Diagnosis not present

## 2021-09-08 DIAGNOSIS — R252 Cramp and spasm: Secondary | ICD-10-CM | POA: Diagnosis not present

## 2021-09-08 DIAGNOSIS — R600 Localized edema: Secondary | ICD-10-CM | POA: Diagnosis not present

## 2021-09-08 NOTE — Therapy (Signed)
[place] [place], [place], [address] Phone: [phone]   Fax:  [phone]  Physical Therapy Evaluation  Patient Details  Name: Sabrina Mcdaniel MRN: [id number] Date of Birth: 1966-02-19 Referring Provider (PT): Rudd   Encounter Date: 09/08/2021   PT End of Session - 09/08/21 1432     Visit Number 13    Date for PT Re-Evaluation 10/09/21    PT Start Time 5465    PT Stop Time 1445    PT Time Calculation (min) 49 min    Activity Tolerance Patient tolerated treatment well    Behavior During Therapy [facility] for tasks assessed/performed             Past Medical History:  Diagnosis Date   Anemia    Iron deficiency   B12 deficiency    Cholelithiasis    Depression    GERD (gastroesophageal reflux disease)    Hiatal hernia 2014   Menopause 2012   Migraines    Osteopenia    Plantar fasciitis 2017   Thyroid disease    hypo   Vitamin D deficiency     Past Surgical History:  Procedure Laterality Date   ABDOMINAL HYSTERECTOMY  2009   anterior and posterior repair   COLONOSCOPY  2011   hyperplastic polyps   ESOPHAGOGASTRODUODENOSCOPY ENDOSCOPY  2013   LASIK  2008   PLANTAR FASCIA SURGERY Left 2018   REPAIR PERONEAL TENDONS ANKLE Right 03/27/2021   TONSILLECTOMY  2000   TUBAL LIGATION  1999    There were no vitals filed for this visit.    Subjective Assessment - 09/08/21 1358     Subjective Patient reports that the foot is feeling better, still some pain she had to go to a ball game and the stairs bothered her some.  She saw MD yesterday for neck pain and HA's and he suggested PT.  She report that this has been going on since the spring.  No x-rays, reports that she saw chiropractor and a massage therapist.  She reports neck pain and stiffness as well as HA's    Patient Stated Goals have less HA, less pain and less stiffness    Currently in Pain? Yes    Pain Score 2     Pain Location Neck    Pain  Orientation Right    Pain Descriptors / Indicators Spasm;Tightness    Pain Type Acute pain    Pain Radiating Towards denies    Pain Onset More than a month ago    Pain Frequency Intermittent    Aggravating Factors  stress, worse with turning head, poor posture pain can be up to 10/10    Pain Relieving Factors massage at times can get pain to go away    Effect of Pain on Daily Activities reports that whent he neck is tight and in spasm has a really bad day due to HA and stiffness                [facility] PT Assessment - 09/08/21 0001       Assessment   Medical Diagnosis cervicalgia    Referring Provider (PT) Rudd    Onset Date/Surgical Date 09/07/21    Hand Dominance Right    Prior Therapy for the foot and ankle      Precautions   Precautions None      AROM   Overall AROM Comments Cervical ROM decreased 25% wtih pain in the right cervical area  and the upper trap, shoulder ROM WFL's      Strength   Overall Strength Comments shoulder on the right 4-/5 with some pain in the upper trap      Palpation   Palpation comment very tender and tight in the right upper trap, and in the right cervical area, a few knots present here with pain and some HA type pain.  spasm and tenderness in the right rhomboid                        Objective measurements completed on examination: See above findings.       [facility] Adult PT Treatment/Exercise - 09/08/21 0001       Modalities   Modalities Electrical Stimulation      Electrical Stimulation   Electrical Stimulation Location right upper trap/neck area    Electrical Stimulation Action IFC    Electrical Stimulation Parameters supine    Electrical Stimulation Goals Pain              Trigger Point Dry Needling - 09/08/21 0001     Consent Given? Yes    Education Handout Provided Yes    Muscles Treated Head and Neck Upper trapezius;Suboccipitals    Upper Trapezius Response Twitch reponse elicited;Palpable increased  muscle length    Suboccipitals Response Twitch response elicited;Palpable increased muscle length                     PT Short Term Goals - 07/14/21 1002       PT SHORT TERM GOAL #1   Title Pt will be independent with initial HEP.    Status Achieved               PT Long Term Goals - 09/08/21 1436       Additional Long Term Goals   Additional Long Term Goals Yes      PT LONG TERM GOAL #6   Title decrease neck and HA pain 50%    Time 8    Period Weeks    Status New                    Plan - 09/08/21 1433     Clinical Impression Statement Patient saw the MD yesterday, he said she may need PT for th neck, has right neck pain, spasm, stiffness and HA for the past 6 months or more.  no known cause.  She has some limited ROM of the neck, she has knots in the upper trap and the cervical right side.  Denies numbness and tingling.  Reports has had good relief with massage in the past.  Tends to elevate the shoulders    Rehab Potential Good    PT Frequency 2x / week    PT Duration 8 weeks    PT Treatment/Interventions ADLs/Self Care Home Management;Cryotherapy;Electrical Stimulation;Iontophoresis 4mg /ml Dexamethasone;Moist Heat;Ultrasound;Gait training;Stair training;Functional mobility training;Therapeutic activities;Therapeutic exercise;Balance training;Patient/family education;Manual techniques;Scar mobilization;Passive range of motion;Dry needling;Taping;Vasopneumatic Device;Joint Manipulations    PT Next Visit Plan will start treatment to the neck and shoulder, continue some on the foot as well    Consulted and Agree with Plan of Care Patient             Patient will benefit from skilled therapeutic intervention in order to improve the following deficits and impairments:  Decreased range of motion, Difficulty walking, Increased muscle spasms, Decreased activity tolerance, Pain, Decreased balance, Decreased strength, Increased edema  Visit  Diagnosis: Pain in joint involving right ankle and foot - Plan: PT plan of care cert/re-cert  Stiffness of right ankle, not elsewhere classified - Plan: PT plan of care cert/re-cert  Difficulty in walking, not elsewhere classified - Plan: PT plan of care cert/re-cert  Muscle weakness (generalized) - Plan: PT plan of care cert/re-cert  Localized edema - Plan: PT plan of care cert/re-cert  Cervicalgia - Plan: PT plan of care cert/re-cert  Cramp and spasm - Plan: PT plan of care cert/re-cert     Problem List Patient Active Problem List   Diagnosis Date Noted   History of non anemic vitamin B12 deficiency 08/26/2021   Gastroesophageal reflux disease 08/26/2021   History of colon polyps 08/26/2021   Abnormal mammogram of left breast 07/13/2021   Epidermoid cyst of skin 08/06/2019   Peroneal tendinitis of right lower leg 02/28/2019   Vitamin D deficiency 09/01/2017   Iron deficiency 07/08/2017   Hypothyroidism 05/27/2017   Midline cystocele 10/31/2014   Urethrocele 10/31/2014   Coccyx pain 10/17/2014   Proteinuria 10/17/2014   Rectocele 10/17/2014    Sumner Boast, PT 09/08/2021, 2:37 PM  [place] [place], [place], [address] Phone: [phone]   Fax:  [phone]  Name: Sabrina Mcdaniel MRN: [id number] Date of Birth: 1966-04-30

## 2021-09-08 NOTE — Patient Instructions (Signed)

## 2021-09-16 ENCOUNTER — Ambulatory Visit: Payer: Federal, State, Local not specified - PPO | Admitting: Physical Therapy

## 2021-09-16 ENCOUNTER — Encounter: Payer: Self-pay | Admitting: Physical Therapy

## 2021-09-16 ENCOUNTER — Other Ambulatory Visit: Payer: Self-pay

## 2021-09-16 DIAGNOSIS — R600 Localized edema: Secondary | ICD-10-CM

## 2021-09-16 DIAGNOSIS — R252 Cramp and spasm: Secondary | ICD-10-CM | POA: Diagnosis not present

## 2021-09-16 DIAGNOSIS — M25671 Stiffness of right ankle, not elsewhere classified: Secondary | ICD-10-CM

## 2021-09-16 DIAGNOSIS — R262 Difficulty in walking, not elsewhere classified: Secondary | ICD-10-CM | POA: Diagnosis not present

## 2021-09-16 DIAGNOSIS — M542 Cervicalgia: Secondary | ICD-10-CM | POA: Diagnosis not present

## 2021-09-16 DIAGNOSIS — M25571 Pain in right ankle and joints of right foot: Secondary | ICD-10-CM

## 2021-09-16 DIAGNOSIS — M6281 Muscle weakness (generalized): Secondary | ICD-10-CM | POA: Diagnosis not present

## 2021-09-16 NOTE — Therapy (Signed)
[place] [place], [place], [address] Phone: [phone]   Fax:  [phone]  Physical Therapy Treatment  Patient Details  Name: Sabrina Mcdaniel MRN: [id number] Date of Birth: 1966/01/16 Referring Provider (PT): Rudd   Encounter Date: 09/16/2021   PT End of Session - 09/16/21 1611     Visit Number 14    Date for PT Re-Evaluation 10/09/21    PT Start Time 1527    PT Stop Time 1621    PT Time Calculation (min) 54 min    Activity Tolerance Patient tolerated treatment well    Behavior During Therapy [facility] for tasks assessed/performed             Past Medical History:  Diagnosis Date   Anemia    Iron deficiency   B12 deficiency    Cholelithiasis    Depression    GERD (gastroesophageal reflux disease)    Hiatal hernia 2014   Menopause 2012   Migraines    Osteopenia    Plantar fasciitis 2017   Thyroid disease    hypo   Vitamin D deficiency     Past Surgical History:  Procedure Laterality Date   ABDOMINAL HYSTERECTOMY  2009   anterior and posterior repair   COLONOSCOPY  2011   hyperplastic polyps   ESOPHAGOGASTRODUODENOSCOPY ENDOSCOPY  2013   LASIK  2008   PLANTAR FASCIA SURGERY Left 2018   REPAIR PERONEAL TENDONS ANKLE Right 03/27/2021   TONSILLECTOMY  2000   TUBAL LIGATION  1999    There were no vitals filed for this visit.                      [facility] Adult PT Treatment/Exercise - 09/16/21 0001       Electrical Stimulation   Electrical Stimulation Location right upper trap/neck area    Electrical Stimulation Action IFC    Electrical Stimulation Parameters supine    Electrical Stimulation Goals Pain      Manual Therapy   Soft tissue mobilization to the right upper trap and neck, to the right calf    Passive ROM stretch of the neck and the gastroc              Trigger Point Dry Needling - 09/16/21 0001     Consent Given? Yes    Muscles Treated Head and Neck  Upper trapezius;Suboccipitals    Muscles Treated Lower Quadrant Gastrocnemius    Upper Trapezius Response Twitch reponse elicited;Palpable increased muscle length    Gastrocnemius Response Twitch response elicited                     PT Short Term Goals - 07/14/21 1002       PT SHORT TERM GOAL #1   Title Pt will be independent with initial HEP.    Status Achieved               PT Long Term Goals - 09/08/21 1436       Additional Long Term Goals   Additional Long Term Goals Yes      PT LONG TERM GOAL #6   Title decrease neck and HA pain 50%    Time 8    Period Weeks    Status New                   Plan - 09/16/21 1612     Clinical Impression Statement [name] reports that  she felt better after the last treatment.  I did DN to the right upper trap and the right calf, dis some stretching and STM to this area.  She has a lot of knots and tenderness in the calf and the right upper trap, the right upper trap seems to send pain to the head and the right arm    PT Next Visit Plan will start treatment to the neck and shoulder, continue some on the foot as well    Consulted and Agree with Plan of Care Patient             Patient will benefit from skilled therapeutic intervention in order to improve the following deficits and impairments:  Decreased range of motion, Difficulty walking, Increased muscle spasms, Decreased activity tolerance, Pain, Decreased balance, Decreased strength, Increased edema  Visit Diagnosis: Pain in joint involving right ankle and foot  Stiffness of right ankle, not elsewhere classified  Difficulty in walking, not elsewhere classified  Muscle weakness (generalized)  Cervicalgia  Cramp and spasm  Localized edema     Problem List Patient Active Problem List   Diagnosis Date Noted   History of non anemic vitamin B12 deficiency 08/26/2021   Gastroesophageal reflux disease 08/26/2021   History of colon polyps  08/26/2021   Abnormal mammogram of left breast 07/13/2021   Epidermoid cyst of skin 08/06/2019   Peroneal tendinitis of right lower leg 02/28/2019   Vitamin D deficiency 09/01/2017   Iron deficiency 07/08/2017   Hypothyroidism 05/27/2017   Midline cystocele 10/31/2014   Urethrocele 10/31/2014   Coccyx pain 10/17/2014   Proteinuria 10/17/2014   Rectocele 10/17/2014    Sumner Boast, PT 09/16/2021, 4:14 PM  [place] [place], [place], [address] Phone: [phone]   Fax:  [phone]  Name: Sabrina Mcdaniel MRN: [id number] Date of Birth: [birth year]

## 2021-09-24 DIAGNOSIS — M9901 Segmental and somatic dysfunction of cervical region: Secondary | ICD-10-CM | POA: Diagnosis not present

## 2021-09-28 DIAGNOSIS — R293 Abnormal posture: Secondary | ICD-10-CM | POA: Diagnosis not present

## 2021-09-28 DIAGNOSIS — M9901 Segmental and somatic dysfunction of cervical region: Secondary | ICD-10-CM | POA: Diagnosis not present

## 2021-09-28 DIAGNOSIS — M9902 Segmental and somatic dysfunction of thoracic region: Secondary | ICD-10-CM | POA: Diagnosis not present

## 2021-09-28 DIAGNOSIS — M5033 Other cervical disc degeneration, cervicothoracic region: Secondary | ICD-10-CM | POA: Diagnosis not present

## 2021-09-30 ENCOUNTER — Other Ambulatory Visit: Payer: Self-pay

## 2021-09-30 ENCOUNTER — Ambulatory Visit: Payer: Federal, State, Local not specified - PPO | Attending: Podiatry | Admitting: Physical Therapy

## 2021-09-30 DIAGNOSIS — R262 Difficulty in walking, not elsewhere classified: Secondary | ICD-10-CM | POA: Diagnosis not present

## 2021-09-30 DIAGNOSIS — M542 Cervicalgia: Secondary | ICD-10-CM | POA: Diagnosis not present

## 2021-09-30 DIAGNOSIS — R252 Cramp and spasm: Secondary | ICD-10-CM | POA: Insufficient documentation

## 2021-09-30 DIAGNOSIS — M6281 Muscle weakness (generalized): Secondary | ICD-10-CM | POA: Diagnosis not present

## 2021-09-30 DIAGNOSIS — M25571 Pain in right ankle and joints of right foot: Secondary | ICD-10-CM | POA: Diagnosis not present

## 2021-09-30 DIAGNOSIS — M25671 Stiffness of right ankle, not elsewhere classified: Secondary | ICD-10-CM | POA: Insufficient documentation

## 2021-09-30 NOTE — Therapy (Signed)
[place]. [place], [place], [address] Phone: [phone]   Fax:  [phone]  Physical Therapy Treatment  Patient Details  Name: Sabrina Mcdaniel MRN: [id number] Date of Birth: 1966-01-05 Referring Provider (PT): Rudd   Encounter Date: 09/30/2021   PT End of Session - 09/30/21 1610     Visit Number 15    Date for PT Re-Evaluation 10/09/21    PT Start Time 1527    PT Stop Time 1622    PT Time Calculation (min) 55 min    Activity Tolerance Patient tolerated treatment well    Behavior During Therapy [facility] for tasks assessed/performed             Past Medical History:  Diagnosis Date   Anemia    Iron deficiency   B12 deficiency    Cholelithiasis    Depression    GERD (gastroesophageal reflux disease)    Hiatal hernia 2014   Menopause 2012   Migraines    Osteopenia    Plantar fasciitis 2017   Thyroid disease    hypo   Vitamin D deficiency     Past Surgical History:  Procedure Laterality Date   ABDOMINAL HYSTERECTOMY  2009   anterior and posterior repair   COLONOSCOPY  2011   hyperplastic polyps   ESOPHAGOGASTRODUODENOSCOPY ENDOSCOPY  2013   LASIK  2008   PLANTAR FASCIA SURGERY Left 2018   REPAIR PERONEAL TENDONS ANKLE Right 03/27/2021   TONSILLECTOMY  2000   TUBAL LIGATION  1999    There were no vitals filed for this visit.   Subjective Assessment - 09/30/21 1607     Subjective Patient comes in and has been doing well with the foot, did a nice walk, no pain and no limp, she reports her biggest issue is the neck and the HA, reports that the last treatment really helped    Currently in Pain? Yes    Pain Score 5     Pain Location Neck    Pain Orientation Right    Pain Descriptors / Indicators Sore;Spasm;Tightness    Aggravating Factors  stress    Pain Relieving Factors the last treatment helped                               [facility] Adult PT Treatment/Exercise -  09/30/21 0001       Electrical Stimulation   Electrical Stimulation Location right upper trap/neck area    Electrical Stimulation Action IFC    Electrical Stimulation Parameters supine    Electrical Stimulation Goals Pain      Manual Therapy   Manual Therapy Manual Traction    Soft tissue mobilization to the right upper trap and neck, to the right calf, levator and SCM    Passive ROM stretch of the neck and the gastroc    Manual Traction occipital release, manual traction                       PT Short Term Goals - 07/14/21 1002       PT SHORT TERM GOAL #1   Title Pt will be independent with initial HEP.    Status Achieved               PT Long Term Goals - 09/30/21 1612       PT LONG TERM GOAL #1   Title Pt will be  independent with advanced HEP.    Status Achieved      PT LONG TERM GOAL #2   Title Increase FOTO to 70% to allow pt to be more independent with hobbies.    Status Partially Met      PT LONG TERM GOAL #3   Title Pt will be able to return to shorter hiking trails without an increase of R foot pain.    Status Partially Met      PT LONG TERM GOAL #5   Title Pt will be able to negotiate a flight of stairs with reciprocal gait pattern without increased pain to allow her to do laundry in her basement.    Status Achieved                   Plan - 09/30/21 1610     Clinical Impression Statement Really worked on the upper trap, levator and SCM STM, stretching and traction, some scapular and thoracic mobiliztion.  She has some knots in the SCM, upper trap and the rhomboids.  Allowed good stretch and felt like the traction helped    PT Next Visit Plan will start treatment to the neck and shoulder, continue some on the foot as well    Consulted and Agree with Plan of Care Patient             Patient will benefit from skilled therapeutic intervention in order to improve the following deficits and impairments:  Decreased range of  motion, Difficulty walking, Increased muscle spasms, Decreased activity tolerance, Pain, Decreased balance, Decreased strength, Increased edema  Visit Diagnosis: Pain in joint involving right ankle and foot  Stiffness of right ankle, not elsewhere classified  Difficulty in walking, not elsewhere classified  Muscle weakness (generalized)  Cervicalgia  Cramp and spasm     Problem List Patient Active Problem List   Diagnosis Date Noted   History of non anemic vitamin B12 deficiency 08/26/2021   Gastroesophageal reflux disease 08/26/2021   History of colon polyps 08/26/2021   Abnormal mammogram of left breast 07/13/2021   Epidermoid cyst of skin 08/06/2019   Peroneal tendinitis of right lower leg 02/28/2019   Vitamin D deficiency 09/01/2017   Iron deficiency 07/08/2017   Hypothyroidism 05/27/2017   Midline cystocele 10/31/2014   Urethrocele 10/31/2014   Coccyx pain 10/17/2014   Proteinuria 10/17/2014   Rectocele 10/17/2014    Sumner Boast, PT 09/30/2021, 4:13 PM  [place]. [place], [place], [address] Phone: [phone]   Fax:  [phone]  Name: SHELVA HETZER MRN: [id number] Date of Birth: 1966/03/02

## 2021-10-14 ENCOUNTER — Ambulatory Visit: Payer: Federal, State, Local not specified - PPO | Admitting: Physical Therapy

## 2021-10-14 ENCOUNTER — Other Ambulatory Visit: Payer: Self-pay

## 2021-10-14 ENCOUNTER — Encounter: Payer: Self-pay | Admitting: Physical Therapy

## 2021-10-14 DIAGNOSIS — M542 Cervicalgia: Secondary | ICD-10-CM

## 2021-10-14 DIAGNOSIS — R262 Difficulty in walking, not elsewhere classified: Secondary | ICD-10-CM | POA: Diagnosis not present

## 2021-10-14 DIAGNOSIS — M25571 Pain in right ankle and joints of right foot: Secondary | ICD-10-CM | POA: Diagnosis not present

## 2021-10-14 DIAGNOSIS — M6281 Muscle weakness (generalized): Secondary | ICD-10-CM

## 2021-10-14 DIAGNOSIS — R252 Cramp and spasm: Secondary | ICD-10-CM | POA: Diagnosis not present

## 2021-10-14 DIAGNOSIS — M25671 Stiffness of right ankle, not elsewhere classified: Secondary | ICD-10-CM

## 2021-10-14 NOTE — Therapy (Signed)
[place]. [place], [place], [address] Phone: [phone]   Fax:  [phone]  Physical Therapy Treatment  Patient Details  Name: Sabrina Mcdaniel MRN: [id number] Date of Birth: [birth year] Referring Provider (PT): Rudd   Encounter Date: 10/14/2021   PT End of Session - 10/14/21 6759     Visit Number 16    Date for PT Re-Evaluation 11/14/21    PT Start Time 1355    PT Stop Time 1453    PT Time Calculation (min) 58 min    Activity Tolerance Patient tolerated treatment well    Behavior During Therapy [facility] for tasks assessed/performed             Past Medical History:  Diagnosis Date   Anemia    Iron deficiency   B12 deficiency    Cholelithiasis    Depression    GERD (gastroesophageal reflux disease)    Hiatal hernia 2014   Menopause 2012   Migraines    Osteopenia    Plantar fasciitis 2017   Thyroid disease    hypo   Vitamin D deficiency     Past Surgical History:  Procedure Laterality Date   ABDOMINAL HYSTERECTOMY  2009   anterior and posterior repair   COLONOSCOPY  2011   hyperplastic polyps   ESOPHAGOGASTRODUODENOSCOPY ENDOSCOPY  2013   LASIK  2008   PLANTAR FASCIA SURGERY Left 2018   REPAIR PERONEAL TENDONS ANKLE Right 03/27/2021   TONSILLECTOMY  2000   TUBAL LIGATION  1999    There were no vitals filed for this visit.   Subjective Assessment - 10/14/21 1356     Subjective Patient reports that she really feels like she is doing better, less pain overall and less diffiuclty with walking and the cervical ROM, reports a little tight with backing up the car    Currently in Pain? Yes    Pain Score 2     Pain Location Neck    Pain Orientation Right    Pain Descriptors / Indicators Sore;Tender;Tightness    Aggravating Factors  hiking                               [facility] Adult PT Treatment/Exercise - 10/14/21 0001       Electrical Stimulation   Electrical  Stimulation Location right upper trap/neck area    Electrical Stimulation Action IFC    Electrical Stimulation Parameters supine    Electrical Stimulation Goals Pain      Manual Therapy   Soft tissue mobilization to the right upper trap and neck, to the right calf, levator and SCM    Passive ROM stretch of the neck and the gastroc    Manual Traction occipital release, manual traction              Trigger Point Dry Needling - 10/14/21 0001     Consent Given? Yes    Muscles Treated Head and Neck Upper trapezius;Suboccipitals    Upper Trapezius Response Twitch reponse elicited;Palpable increased muscle length    Suboccipitals Response Twitch response elicited;Palpable increased muscle length                     PT Short Term Goals - 07/14/21 1002       PT SHORT TERM GOAL #1   Title Pt will be independent with initial HEP.    Status Achieved  PT Long Term Goals - 10/14/21 1440       PT LONG TERM GOAL #1   Title Pt will be independent with advanced HEP.    Status Achieved      PT LONG TERM GOAL #2   Title Increase FOTO to 70% to allow pt to be more independent with hobbies.    Status Achieved      PT LONG TERM GOAL #3   Title Pt will be able to return to shorter hiking trails without an increase of R foot pain.    Status Achieved      PT LONG TERM GOAL #4   Title Increase R foot strength to 4+/5 to allow pt to negotiate steps..    Status Achieved      PT LONG TERM GOAL #5   Title Pt will be able to negotiate a flight of stairs with reciprocal gait pattern without increased pain to allow her to do laundry in her basement.    Status Achieved      PT LONG TERM GOAL #6   Title decrease neck and HA pain 50%    Status Achieved                   Plan - 10/14/21 1438     Clinical Impression Statement Patient reports that she is doing very well, having less foot pain and less diffiuclty walking, she reports that after the last  treatment her neck was much better and has been moving better, still issues with the right upper trap and the right SCM, she reports difficulty backing up the car.  She has some trigger points in the right upper trap and the right SCM, both refer to the head on the right and posterior.  I did some contract relax to release facet and increase ROM.  She tolerated this well    PT Frequency 1x / week    PT Duration 8 weeks    PT Treatment/Interventions ADLs/Self Care Home Management;Cryotherapy;Electrical Stimulation;Iontophoresis 85m/ml Dexamethasone;Moist Heat;Ultrasound;Gait training;Stair training;Functional mobility training;Therapeutic activities;Therapeutic exercise;Balance training;Patient/family education;Manual techniques;Scar mobilization;Passive range of motion;Dry needling;Taping;Vasopneumatic Device;Joint Manipulations    PT Next Visit Plan may hold as she will try to manage her symptoms on her own, I will renew and see if she needs anything in the next 6 weeks or so , if not will d/c with goals met    Consulted and Agree with Plan of Care Patient             Patient will benefit from skilled therapeutic intervention in order to improve the following deficits and impairments:  Decreased range of motion, Difficulty walking, Increased muscle spasms, Decreased activity tolerance, Pain, Decreased balance, Decreased strength, Increased edema  Visit Diagnosis: Pain in joint involving right ankle and foot - Plan: PT plan of care cert/re-cert  Stiffness of right ankle, not elsewhere classified - Plan: PT plan of care cert/re-cert  Difficulty in walking, not elsewhere classified - Plan: PT plan of care cert/re-cert  Muscle weakness (generalized) - Plan: PT plan of care cert/re-cert  Cervicalgia - Plan: PT plan of care cert/re-cert  Cramp and spasm - Plan: PT plan of care cert/re-cert     Problem List Patient Active Problem List   Diagnosis Date Noted   History of non anemic vitamin  B12 deficiency 08/26/2021   Gastroesophageal reflux disease 08/26/2021   History of colon polyps 08/26/2021   Abnormal mammogram of left breast 07/13/2021   Epidermoid cyst of skin 08/06/2019  Peroneal tendinitis of right lower leg 02/28/2019   Vitamin D deficiency 09/01/2017   Iron deficiency 07/08/2017   Hypothyroidism 05/27/2017   Midline cystocele 10/31/2014   Urethrocele 10/31/2014   Coccyx pain 10/17/2014   Proteinuria 10/17/2014   Rectocele 10/17/2014    Sumner Boast, PT 10/14/2021, 2:42 PM  [place]. [place], [place], [address] Phone: [phone]   Fax:  [phone]  Name: Sabrina Mcdaniel MRN: [id number] Date of Birth: [birth year]

## 2021-10-23 ENCOUNTER — Ambulatory Visit: Payer: Federal, State, Local not specified - PPO | Attending: Podiatry | Admitting: Physical Therapy

## 2021-10-23 ENCOUNTER — Encounter: Payer: Self-pay | Admitting: Physical Therapy

## 2021-10-23 ENCOUNTER — Other Ambulatory Visit: Payer: Self-pay

## 2021-10-23 DIAGNOSIS — M25671 Stiffness of right ankle, not elsewhere classified: Secondary | ICD-10-CM | POA: Insufficient documentation

## 2021-10-23 DIAGNOSIS — M6281 Muscle weakness (generalized): Secondary | ICD-10-CM | POA: Diagnosis not present

## 2021-10-23 DIAGNOSIS — R600 Localized edema: Secondary | ICD-10-CM | POA: Insufficient documentation

## 2021-10-23 DIAGNOSIS — R252 Cramp and spasm: Secondary | ICD-10-CM | POA: Diagnosis not present

## 2021-10-23 DIAGNOSIS — M542 Cervicalgia: Secondary | ICD-10-CM | POA: Insufficient documentation

## 2021-10-23 DIAGNOSIS — M25571 Pain in right ankle and joints of right foot: Secondary | ICD-10-CM | POA: Insufficient documentation

## 2021-10-23 DIAGNOSIS — R262 Difficulty in walking, not elsewhere classified: Secondary | ICD-10-CM | POA: Diagnosis not present

## 2021-10-23 NOTE — Therapy (Signed)
[place]. [place], [place], [address] Phone: [phone]   Fax:  [phone]  Physical Therapy Treatment  Patient Details  Name: Sabrina Mcdaniel MRN: [id number] Date of Birth: November 19, 1965 Referring Provider (PT): Rudd   Encounter Date: 10/23/2021   PT End of Session - 10/23/21 1056     Visit Number 17    Date for PT Re-Evaluation 11/14/21    PT Start Time 1015    PT Stop Time 1105    PT Time Calculation (min) 50 min    Activity Tolerance Patient tolerated treatment well    Behavior During Therapy [facility] for tasks assessed/performed             Past Medical History:  Diagnosis Date   Anemia    Iron deficiency   B12 deficiency    Cholelithiasis    Depression    GERD (gastroesophageal reflux disease)    Hiatal hernia 2014   Menopause 2012   Migraines    Osteopenia    Plantar fasciitis 2017   Thyroid disease    hypo   Vitamin D deficiency     Past Surgical History:  Procedure Laterality Date   ABDOMINAL HYSTERECTOMY  2009   anterior and posterior repair   COLONOSCOPY  2011   hyperplastic polyps   ESOPHAGOGASTRODUODENOSCOPY ENDOSCOPY  2013   LASIK  2008   PLANTAR FASCIA SURGERY Left 2018   REPAIR PERONEAL TENDONS ANKLE Right 03/27/2021   TONSILLECTOMY  2000   TUBAL LIGATION  1999    There were no vitals filed for this visit.   Subjective Assessment - 10/23/21 1015     Subjective "I thought I was better but Im not" Could not turn her head yesterday    Currently in Pain? Yes    Pain Score 2     Pain Location Neck    Pain Orientation Right                               [facility] Adult PT Treatment/Exercise - 10/23/21 0001       Electrical Stimulation   Electrical Stimulation Location right upper trap/neck area    Printmaker Action IFC    Electrical Stimulation Parameters supie    Electrical Stimulation Goals Pain      Manual Therapy   Manual Therapy  Manual Traction    Soft tissue mobilization to the right upper trap and neck, to the right calf, levator and SCM    Passive ROM stretch of the neck and the gastroc    Manual Traction occipital release, manual traction                       PT Short Term Goals - 07/14/21 1002       PT SHORT TERM GOAL #1   Title Pt will be independent with initial HEP.    Status Achieved               PT Long Term Goals - 10/14/21 1440       PT LONG TERM GOAL #1   Title Pt will be independent with advanced HEP.    Status Achieved      PT LONG TERM GOAL #2   Title Increase FOTO to 70% to allow pt to be more independent with hobbies.    Status Achieved      PT LONG TERM GOAL #  3   Title Pt will be able to return to shorter hiking trails without an increase of R foot pain.    Status Achieved      PT LONG TERM GOAL #4   Title Increase R foot strength to 4+/5 to allow pt to negotiate steps..    Status Achieved      PT LONG TERM GOAL #5   Title Pt will be able to negotiate a flight of stairs with reciprocal gait pattern without increased pain to allow her to do laundry in her basement.    Status Achieved      PT LONG TERM GOAL #6   Title decrease neck and HA pain 50%    Status Achieved                   Plan - 10/23/21 1057     Clinical Impression Statement Pt reports that she was doing well up until yesterday, were she experienced an increase in heck pain with decrease mobility. Pt stated that she applies an hot pack to the area improving her symptoms. Some tenderness at incretion side of R SCM area noted with STM. Some tissue density noted in the R upper trap. Positive response to deep pressure and stretching.    Examination-Participation Restrictions Yard Work    Stability/Clinical Decision Making Stable/Uncomplicated    Rehab Potential Good    PT Frequency 1x / week    PT Treatment/Interventions ADLs/Self Care Home Management;Cryotherapy;Electrical  Stimulation;Iontophoresis 4mg /ml Dexamethasone;Moist Heat;Ultrasound;Gait training;Stair training;Functional mobility training;Therapeutic activities;Therapeutic exercise;Balance training;Patient/family education;Manual techniques;Scar mobilization;Passive range of motion;Dry needling;Taping;Vasopneumatic Device;Joint Manipulations    PT Next Visit Plan Renew             Patient will benefit from skilled therapeutic intervention in order to improve the following deficits and impairments:  Decreased range of motion, Difficulty walking, Increased muscle spasms, Decreased activity tolerance, Pain, Decreased balance, Decreased strength, Increased edema  Visit Diagnosis: Pain in joint involving right ankle and foot  Cervicalgia  Cramp and spasm     Problem List Patient Active Problem List   Diagnosis Date Noted   History of non anemic vitamin B12 deficiency 08/26/2021   Gastroesophageal reflux disease 08/26/2021   History of colon polyps 08/26/2021   Abnormal mammogram of left breast 07/13/2021   Epidermoid cyst of skin 08/06/2019   Peroneal tendinitis of right lower leg 02/28/2019   Vitamin D deficiency 09/01/2017   Iron deficiency 07/08/2017   Hypothyroidism 05/27/2017   Midline cystocele 10/31/2014   Urethrocele 10/31/2014   Coccyx pain 10/17/2014   Proteinuria 10/17/2014   Rectocele 10/17/2014    Scot Jun, PTA 10/23/2021, 11:11 AM  [place]. [place], [place], [address] Phone: [phone]   Fax:  [phone]  Name: Sabrina Mcdaniel MRN: [id number] Date of Birth: 11/12/1965

## 2021-10-26 ENCOUNTER — Other Ambulatory Visit: Payer: Self-pay

## 2021-10-26 ENCOUNTER — Encounter: Payer: Self-pay | Admitting: Physical Therapy

## 2021-10-26 ENCOUNTER — Ambulatory Visit: Payer: Federal, State, Local not specified - PPO | Admitting: Physical Therapy

## 2021-10-26 DIAGNOSIS — R600 Localized edema: Secondary | ICD-10-CM | POA: Diagnosis not present

## 2021-10-26 DIAGNOSIS — R252 Cramp and spasm: Secondary | ICD-10-CM

## 2021-10-26 DIAGNOSIS — M6281 Muscle weakness (generalized): Secondary | ICD-10-CM

## 2021-10-26 DIAGNOSIS — M25671 Stiffness of right ankle, not elsewhere classified: Secondary | ICD-10-CM

## 2021-10-26 DIAGNOSIS — M25571 Pain in right ankle and joints of right foot: Secondary | ICD-10-CM

## 2021-10-26 DIAGNOSIS — R262 Difficulty in walking, not elsewhere classified: Secondary | ICD-10-CM | POA: Diagnosis not present

## 2021-10-26 DIAGNOSIS — M542 Cervicalgia: Secondary | ICD-10-CM | POA: Diagnosis not present

## 2021-10-26 NOTE — Therapy (Signed)
[place]. [place], [place], [address] Phone: [phone]   Fax:  [phone]  Physical Therapy Treatment  Patient Details  Name: Sabrina Mcdaniel MRN: [id number] Date of Birth: Nov 04, 1965 Referring Provider (PT): Rudd   Encounter Date: 10/26/2021   PT End of Session - 10/26/21 1713     Visit Number 18    Date for PT Re-Evaluation 11/14/21    PT Start Time 1632    PT Stop Time 8546    PT Time Calculation (min) 39 min    Activity Tolerance Patient tolerated treatment well    Behavior During Therapy [facility] for tasks assessed/performed             Past Medical History:  Diagnosis Date   Anemia    Iron deficiency   B12 deficiency    Cholelithiasis    Depression    GERD (gastroesophageal reflux disease)    Hiatal hernia 2014   Menopause 2012   Migraines    Osteopenia    Plantar fasciitis 2017   Thyroid disease    hypo   Vitamin D deficiency     Past Surgical History:  Procedure Laterality Date   ABDOMINAL HYSTERECTOMY  2009   anterior and posterior repair   COLONOSCOPY  2011   hyperplastic polyps   ESOPHAGOGASTRODUODENOSCOPY ENDOSCOPY  2013   LASIK  2008   PLANTAR FASCIA SURGERY Left 2018   REPAIR PERONEAL TENDONS ANKLE Right 03/27/2021   TONSILLECTOMY  2000   TUBAL LIGATION  1999    There were no vitals filed for this visit.   Subjective Assessment - 10/26/21 1712     Subjective Pateint reports that her neck pain has returned. She feels it is possibly due to her posture. she notes increased pain with activities above her head. PF appears resolved.                [facility] PT Assessment - 10/26/21 0001       AROM   Overall AROM Comments Cervical ROM mildly limited in R lateral flexion and rotation. Rotation improves with chin tuck. patient reports that she feels like her muscle is sliding over something under her R occiput and she reports tihgness deep in her R upper cervical paraspinals.       Palpation   Palpation comment SUpine- no tightness palpated. In sitting, patient with trigger points in R rhomboid, mod tightness in upper traps.      Special Tests   Other special tests no relief with repeated rotation to R.                                    PT Education - 10/26/21 1712     Education Details Educated to [employer] and POC    Person(s) Educated Patient    Methods Explanation;Demonstration;Handout    Comprehension Returned demonstration;Verbalized understanding              PT Short Term Goals - 07/14/21 1002       PT SHORT TERM GOAL #1   Title Pt will be independent with initial HEP.    Status Achieved               PT Long Term Goals - 10/14/21 1440       PT LONG TERM GOAL #1   Title Pt will be independent with advanced HEP.  Status Achieved      PT LONG TERM GOAL #2   Title Increase FOTO to 70% to allow pt to be more independent with hobbies.    Status Achieved      PT LONG TERM GOAL #3   Title Pt will be able to return to shorter hiking trails without an increase of R foot pain.    Status Achieved      PT LONG TERM GOAL #4   Title Increase R foot strength to 4+/5 to allow pt to negotiate steps..    Status Achieved      PT LONG TERM GOAL #5   Title Pt will be able to negotiate a flight of stairs with reciprocal gait pattern without increased pain to allow her to do laundry in her basement.    Status Achieved      PT LONG TERM GOAL #6   Title decrease neck and HA pain 50%    Status Achieved                   Plan - 10/26/21 1714     Clinical Impression Statement Patient reports PF is resolved. her neck pain has returned. She reports details on her work set up, which appears to be very supportive and encourages upright posture. Therapsit re-assessed pain, which appears to be soft tissue related, possibly upper traps or SCM, with possbile inflamation at origin on occiput. She will benefit from PT  to address her acute pain as well as establish the appropriate HEP to stretch and strengthen her posutral muscles to prevent relapse.    Examination-Participation Restrictions Yard Work    Stability/Clinical Decision Making Stable/Uncomplicated    Clinical Decision Making Low    Rehab Potential Good    PT Frequency 1x / week    PT Treatment/Interventions ADLs/Self Care Home Management;Cryotherapy;Electrical Stimulation;Iontophoresis 4mg /ml Dexamethasone;Moist Heat;Ultrasound;Gait training;Stair training;Functional mobility training;Therapeutic activities;Therapeutic exercise;Balance training;Patient/family education;Manual techniques;Scar mobilization;Passive range of motion;Dry needling;Taping;Vasopneumatic Device;Joint Manipulations    PT Next Visit Plan Treat acute pain in R upper trunk,             Patient will benefit from skilled therapeutic intervention in order to improve the following deficits and impairments:  Decreased range of motion, Difficulty walking, Increased muscle spasms, Decreased activity tolerance, Pain, Decreased balance, Decreased strength, Increased edema  Visit Diagnosis: Pain in joint involving right ankle and foot  Cervicalgia  Cramp and spasm  Stiffness of right ankle, not elsewhere classified  Muscle weakness (generalized)  Localized edema     Problem List Patient Active Problem List   Diagnosis Date Noted   History of non anemic vitamin B12 deficiency 08/26/2021   Gastroesophageal reflux disease 08/26/2021   History of colon polyps 08/26/2021   Abnormal mammogram of left breast 07/13/2021   Epidermoid cyst of skin 08/06/2019   Peroneal tendinitis of right lower leg 02/28/2019   Vitamin D deficiency 09/01/2017   Iron deficiency 07/08/2017   Hypothyroidism 05/27/2017   Midline cystocele 10/31/2014   Urethrocele 10/31/2014   Coccyx pain 10/17/2014   Proteinuria 10/17/2014   Rectocele 10/17/2014    Marcelina Morel, DPT 10/26/2021, 5:58  PM  [place] [place], [place], [address] Phone: [phone]   Fax:  [phone]  Name: KASHARA BLOCHER MRN: [id number] Date of Birth: 12-Apr-1966

## 2021-10-26 NOTE — Patient Instructions (Signed)
Access Code: [email or web address] URL: [email or web address] Date: 10/26/2021 Prepared by: Ethel Rana  Exercises Seated Scapular Retraction - 1 x daily - 7 x weekly - 3 sets - 10 reps Seated Cervical Retraction - 1 x daily - 7 x weekly - 3 sets - 10 reps

## 2021-10-30 ENCOUNTER — Ambulatory Visit: Payer: Federal, State, Local not specified - PPO | Admitting: Physical Therapy

## 2021-10-30 ENCOUNTER — Other Ambulatory Visit: Payer: Self-pay

## 2021-10-30 ENCOUNTER — Encounter: Payer: Self-pay | Admitting: Physical Therapy

## 2021-10-30 DIAGNOSIS — M6281 Muscle weakness (generalized): Secondary | ICD-10-CM | POA: Diagnosis not present

## 2021-10-30 DIAGNOSIS — R252 Cramp and spasm: Secondary | ICD-10-CM

## 2021-10-30 DIAGNOSIS — M25671 Stiffness of right ankle, not elsewhere classified: Secondary | ICD-10-CM | POA: Diagnosis not present

## 2021-10-30 DIAGNOSIS — R600 Localized edema: Secondary | ICD-10-CM | POA: Diagnosis not present

## 2021-10-30 DIAGNOSIS — M542 Cervicalgia: Secondary | ICD-10-CM

## 2021-10-30 DIAGNOSIS — R262 Difficulty in walking, not elsewhere classified: Secondary | ICD-10-CM | POA: Diagnosis not present

## 2021-10-30 DIAGNOSIS — M25571 Pain in right ankle and joints of right foot: Secondary | ICD-10-CM | POA: Diagnosis not present

## 2021-10-30 NOTE — Therapy (Signed)
[place]. [place], [place], [address] Phone: [phone]   Fax:  [phone]  Physical Therapy Treatment  Patient Details  Name: Sabrina Mcdaniel MRN: [id number] Date of Birth: [birth year] Referring Provider (PT): Rudd   Encounter Date: 10/30/2021   PT End of Session - 10/30/21 1143     Visit Number 19    Date for PT Re-Evaluation 11/14/21    PT Start Time 1105    PT Stop Time 1148    PT Time Calculation (min) 43 min    Activity Tolerance Patient tolerated treatment well    Behavior During Therapy [facility] for tasks assessed/performed             Past Medical History:  Diagnosis Date   Anemia    Iron deficiency   B12 deficiency    Cholelithiasis    Depression    GERD (gastroesophageal reflux disease)    Hiatal hernia 2014   Menopause 2012   Migraines    Osteopenia    Plantar fasciitis 2017   Thyroid disease    hypo   Vitamin D deficiency     Past Surgical History:  Procedure Laterality Date   ABDOMINAL HYSTERECTOMY  2009   anterior and posterior repair   COLONOSCOPY  2011   hyperplastic polyps   ESOPHAGOGASTRODUODENOSCOPY ENDOSCOPY  2013   LASIK  2008   PLANTAR FASCIA SURGERY Left 2018   REPAIR PERONEAL TENDONS ANKLE Right 03/27/2021   TONSILLECTOMY  2000   TUBAL LIGATION  1999    There were no vitals filed for this visit.   Subjective Assessment - 10/30/21 1104     Subjective Patient purchased a cervical collar that she wears intermittantly while working to help maintain her head in a better position. She is having episodes where she cannot turn her head to the R. She feels pain and tihgtness in her R neck, upper traps, and also anterior neck. If she squeezes her muscles, she can get very temporary relief. She feels/hears, crackles when turning her head either way.    Currently in Pain? Yes    Pain Score 2     Pain Location Neck    Pain Orientation Right;Posterior;Distal    Pain  Descriptors / Indicators Aching;Tightness    Pain Type Acute pain    Pain Onset More than a month ago    Pain Frequency Intermittent    Multiple Pain Sites No                               [facility] Adult PT Treatment/Exercise - 10/30/21 0001       Exercises   Exercises Neck      Neck Exercises: Stretches   Upper Trapezius Stretch Right;Left;3 reps;20 seconds    Other Neck Stretches Scalenes and SCM 3 x 20 sec B.      Modalities   Modalities Moist Heat;Electrical Stimulation      Moist Heat Therapy   Number Minutes Moist Heat 15 Minutes    Moist Heat Location Cervical      Electrical Stimulation   Electrical Stimulation Location right upper trap/neck area    Electrical Stimulation Action IFC    Electrical Stimulation Parameters supine    Electrical Stimulation Goals Pain      Manual Therapy   Manual Therapy Manual Traction;Passive ROM;Soft tissue mobilization    Soft tissue mobilization R up trap, scalenes, SCM, paraspinals on  R.    Passive ROM neck stretch, scalenes, SCM, up traps    Manual Traction occipital release, manual traction                     PT Education - 10/30/21 1142     Education Details Updated HEP    Person(s) Educated Patient    Methods Explanation;Demonstration;Handout    Comprehension Verbalized understanding;Returned demonstration              PT Short Term Goals - 10/30/21 1148       PT SHORT TERM GOAL #1   Title Pt will be independent with initial HEP.    Status Achieved      PT SHORT TERM GOAL #2   Title Paitent will be I with initial HEP for her neck.    Time 3    Period Weeks    Status New    Target Date 11/20/21               PT Long Term Goals - 10/14/21 1440       PT LONG TERM GOAL #1   Title Pt will be independent with advanced HEP.    Status Achieved      PT LONG TERM GOAL #2   Title Increase FOTO to 70% to allow pt to be more independent with hobbies.    Status Achieved       PT LONG TERM GOAL #3   Title Pt will be able to return to shorter hiking trails without an increase of R foot pain.    Status Achieved      PT LONG TERM GOAL #4   Title Increase R foot strength to 4+/5 to allow pt to negotiate steps..    Status Achieved      PT LONG TERM GOAL #5   Title Pt will be able to negotiate a flight of stairs with reciprocal gait pattern without increased pain to allow her to do laundry in her basement.    Status Achieved      PT LONG TERM GOAL #6   Title decrease neck and HA pain 50%    Status Achieved                   Plan - 10/30/21 1144     Clinical Impression Statement Patient has purchasd a cervical collar ans she wears it intermittantly when working to contorl her lateral neck flex to L and improve cervical posture. Treatment focused on STM, addressing scalenes, SCM, up traps, paraspinals along cervical spine. Patient then performed stretching exercises and finished with E-stime and MH for pain relief. Plan to add scapular and postural strength and stability exercises to HEP on next visitr, DN for trigger points.    Examination-Participation Restrictions Yard Work    Stability/Clinical Decision Making Stable/Uncomplicated    Rehab Potential Good    PT Frequency 1x / week    PT Treatment/Interventions ADLs/Self Care Home Management;Cryotherapy;Electrical Stimulation;Iontophoresis 4mg /ml Dexamethasone;Moist Heat;Ultrasound;Gait training;Stair training;Functional mobility training;Therapeutic activities;Therapeutic exercise;Balance training;Patient/family education;Manual techniques;Scar mobilization;Passive range of motion;Dry needling;Taping;Vasopneumatic Device;Joint Manipulations    PT Next Visit Plan Treat acute pain in R upper trunk, update HEP with scapular stability, postural strengthening. DN    PT Home Exercise Plan [email or web address]             Patient will benefit from skilled therapeutic intervention in order to improve the following  deficits and impairments:  Decreased range of motion, Difficulty walking, Increased muscle spasms,  Decreased activity tolerance, Pain, Decreased balance, Decreased strength, Increased edema  Visit Diagnosis: Cervicalgia  Cramp and spasm  Muscle weakness (generalized)  Localized edema     Problem List Patient Active Problem List   Diagnosis Date Noted   History of non anemic vitamin B12 deficiency 08/26/2021   Gastroesophageal reflux disease 08/26/2021   History of colon polyps 08/26/2021   Abnormal mammogram of left breast 07/13/2021   Epidermoid cyst of skin 08/06/2019   Peroneal tendinitis of right lower leg 02/28/2019   Vitamin D deficiency 09/01/2017   Iron deficiency 07/08/2017   Hypothyroidism 05/27/2017   Midline cystocele 10/31/2014   Urethrocele 10/31/2014   Coccyx pain 10/17/2014   Proteinuria 10/17/2014   Rectocele 10/17/2014    Marcelina Morel, DPT 10/30/2021, 11:49 AM  [place]. [place], [place], [address] Phone: [phone]   Fax:  [phone]  Name: Sabrina Mcdaniel MRN: [id number] Date of Birth: [birth year]

## 2021-10-30 NOTE — Patient Instructions (Signed)
Access Code: [email or web address] URL: [email or web address] Date: 10/30/2021 Prepared by: Ethel Rana  Exercises Seated Scalenes Stretch - 1 x daily - 7 x weekly - 3 sets - 15 hold Sternocleidomastoid Stretch - 1 x daily - 7 x weekly - 3 sets - 15 hold Seated Gentle Upper Trapezius Stretch - 1 x daily - 7 x weekly - 3 sets - 15 hold

## 2021-11-02 ENCOUNTER — Other Ambulatory Visit: Payer: Self-pay

## 2021-11-03 ENCOUNTER — Encounter: Payer: Self-pay | Admitting: Physical Therapy

## 2021-11-03 ENCOUNTER — Ambulatory Visit: Payer: Federal, State, Local not specified - PPO | Admitting: Family Medicine

## 2021-11-03 ENCOUNTER — Ambulatory Visit: Payer: Federal, State, Local not specified - PPO | Admitting: Physical Therapy

## 2021-11-03 VITALS — BP 118/70 | HR 61 | Temp 97.5°F | Ht 63.25 in | Wt 190.8 lb

## 2021-11-03 DIAGNOSIS — R21 Rash and other nonspecific skin eruption: Secondary | ICD-10-CM

## 2021-11-03 DIAGNOSIS — R252 Cramp and spasm: Secondary | ICD-10-CM

## 2021-11-03 DIAGNOSIS — M25671 Stiffness of right ankle, not elsewhere classified: Secondary | ICD-10-CM | POA: Diagnosis not present

## 2021-11-03 DIAGNOSIS — M25571 Pain in right ankle and joints of right foot: Secondary | ICD-10-CM | POA: Diagnosis not present

## 2021-11-03 DIAGNOSIS — R262 Difficulty in walking, not elsewhere classified: Secondary | ICD-10-CM | POA: Diagnosis not present

## 2021-11-03 DIAGNOSIS — M6281 Muscle weakness (generalized): Secondary | ICD-10-CM

## 2021-11-03 DIAGNOSIS — R600 Localized edema: Secondary | ICD-10-CM

## 2021-11-03 DIAGNOSIS — M542 Cervicalgia: Secondary | ICD-10-CM

## 2021-11-03 MED ORDER — TRIAMCINOLONE ACETONIDE 0.1 % EX CREA: 1.0000 "application " | TOPICAL_CREAM | Freq: Two times a day (BID) | CUTANEOUS | 0 refills | Status: DC

## 2021-11-03 NOTE — Progress Notes (Signed)
[facility] [address] [place] [address] Dept: [phone] Dept Fax: [phone]  Office Visit  Subjective:    Patient ID: Sabrina Mcdaniel, female    DOB: [birth year], 56 y.o..   MRN: [id number]  Chief Complaint  Patient presents with   Acute Visit    C/o having a spot on her back that is numb, rough x 1 month.      History of Present Illness:  Patient is in today for evaluation of a skin rash. She notes she has an area in her mid back that has felt rough, or with small bumps. The skin in the area feels slightly numb. She has scratched at this some. She did use an OTC hydrocortisone cream and much of the roughness/bumps resolved, but not entirely.  Past Medical History: Patient Active Problem List   Diagnosis Date Noted   History of non anemic vitamin B12 deficiency 08/26/2021   Gastroesophageal reflux disease 08/26/2021   History of colon polyps 08/26/2021   Abnormal mammogram of left breast 07/13/2021   Epidermoid cyst of skin 08/06/2019   Peroneal tendinitis of right lower leg 02/28/2019   Vitamin D deficiency 09/01/2017   Iron deficiency 07/08/2017   Hypothyroidism 05/27/2017   Midline cystocele 10/31/2014   Urethrocele 10/31/2014   Coccyx pain 10/17/2014   Proteinuria 10/17/2014   Rectocele 10/17/2014   Past Surgical History:  Procedure Laterality Date   ABDOMINAL HYSTERECTOMY  2009   anterior and posterior repair   COLONOSCOPY  2011   hyperplastic polyps   ESOPHAGOGASTRODUODENOSCOPY ENDOSCOPY  2013   LASIK  2008   PLANTAR FASCIA SURGERY Left 2018   REPAIR PERONEAL TENDONS ANKLE Right 03/27/2021   TONSILLECTOMY  2000   TUBAL LIGATION  1999   Family History  Problem Relation Age of Onset   Cancer Mother 64       breast    Hypothyroidism Mother    Heart disease Father    Diabetes Father    Cancer Father        Acute myelogenous leukemia   Colon cancer Maternal Aunt    Stroke Maternal  Grandmother    Cancer Maternal Grandfather        Prostate   Heart disease Paternal Grandmother    Stroke Paternal Grandfather    Colon polyps Neg Hx    Esophageal cancer Neg Hx    Rectal cancer Neg Hx    Stomach cancer Neg Hx    Outpatient Medications Prior to Visit  Medication Sig Dispense Refill   cyanocobalamin (,VITAMIN B-12,) 1000 MCG/ML injection Use as directed 3 mL 3   esomeprazole (NEXIUM) 20 MG capsule Take 20 mg by mouth daily at 12 noon.     ibuprofen (ADVIL) 200 MG tablet Take 200 mg by mouth every 6 (six) hours as needed.     levothyroxine (SYNTHROID) 75 MCG tablet Take 1 tablet by mouth daily. 90 tablet 3   Prenatal Vit-Fe Fumarate-FA (PRENATAL COMPLETE PO) Take by mouth.     Vitamin D, Ergocalciferol, (DRISDOL) 1.25 MG (50000 UNIT) CAPS capsule Take 1 capsule (50,000 Units total) by mouth every 7 (seven) days. 12 capsule 3   No facility-administered medications prior to visit.   No Known Allergies    Objective:   Today's Vitals   11/03/21 1513  BP: 118/70  Pulse: 61  Temp: (!) 97.5 F (36.4 C)  TempSrc: Temporal  SpO2: 96%  Weight: 190 lb 12.8 oz (86.5 kg)  Height: 5'  3.25" (1.607 m)   Body mass index is 33.53 kg/m.   General: Well developed, well nourished. No acute distress. Skin: Warm and dry. There are 1-2 small excoriated papules over the skin just to the right of the mid   thoracic spine. There is no erythema and no palpable cyst present. Psych: Alert and oriented. Normal mood and affect.  Health Maintenance Due  Topic Date Due   HIV Screening  Never done   Hepatitis C Screening  Never done   COVID-19 Vaccine (3 - Booster for Pfizer series) 02/28/2020     Assessment & Plan:   1. Papular rash, localized Since this did not resolve with HC cream, we will try a more potent steroid. This shodul resolve with a bit more time.  - triamcinolone cream (KENALOG) 0.1 %; Apply 1 application topically 2 (two) times daily.  Dispense: 30 g; Refill:  0  Haydee Salter, MD

## 2021-11-03 NOTE — Therapy (Signed)
[place]. [place], [place], [address] Phone: [phone]   Fax:  [phone]  Physical Therapy Treatment  Patient Details  Name: Sabrina Mcdaniel MRN: [id number] Date of Birth: 1966-08-07 Referring Provider (PT): Rudd   Encounter Date: 11/03/2021   PT End of Session - 11/03/21 1427     Visit Number 20    Date for PT Re-Evaluation 11/14/21    PT Start Time 1345    PT Stop Time 1435    PT Time Calculation (min) 50 min    Activity Tolerance Patient tolerated treatment well    Behavior During Therapy [facility] for tasks assessed/performed             Past Medical History:  Diagnosis Date   Anemia    Iron deficiency   B12 deficiency    Cholelithiasis    Depression    GERD (gastroesophageal reflux disease)    Hiatal hernia 2014   Menopause 2012   Migraines    Osteopenia    Plantar fasciitis 2017   Thyroid disease    hypo   Vitamin D deficiency     Past Surgical History:  Procedure Laterality Date   ABDOMINAL HYSTERECTOMY  2009   anterior and posterior repair   COLONOSCOPY  2011   hyperplastic polyps   ESOPHAGOGASTRODUODENOSCOPY ENDOSCOPY  2013   LASIK  2008   PLANTAR FASCIA SURGERY Left 2018   REPAIR PERONEAL TENDONS ANKLE Right 03/27/2021   TONSILLECTOMY  2000   TUBAL LIGATION  1999    There were no vitals filed for this visit.   Subjective Assessment - 11/03/21 1344     Subjective Today is a good day, Has been moving more, worked at different place at her house.    Currently in Pain? No/denies    Pain Score 2     Pain Location Neck    Pain Orientation Right                               [facility] Adult PT Treatment/Exercise - 11/03/21 0001       Neck Exercises: Machines for Strengthening   UBE (Upper Arm Bike) L1.3 x 2 min each      Neck Exercises: Theraband   Shoulder External Rotation 20 reps;Red      Neck Exercises: Standing   Other Standing Exercises Rows red  2x10    Other Standing Exercises Ext red 2x10      Neck Exercises: Seated   Neck Retraction 10 reps;3 secs   x2     Moist Heat Therapy   Number Minutes Moist Heat 10 Minutes    Moist Heat Location Cervical      Manual Therapy   Manual Therapy Manual Traction;Passive ROM;Soft tissue mobilization    Soft tissue mobilization R up trap, scalenes, SCM, paraspinals on R.    Passive ROM neck stretch, scalenes, SCM, up traps    Manual Traction occipital release, manual traction                       PT Short Term Goals - 10/30/21 1148       PT SHORT TERM GOAL #1   Title Pt will be independent with initial HEP.    Status Achieved      PT SHORT TERM GOAL #2   Title Paitent will be I with initial HEP for her neck.  Time 3    Period Weeks    Status New    Target Date 11/20/21               PT Long Term Goals - 10/14/21 1440       PT LONG TERM GOAL #1   Title Pt will be independent with advanced HEP.    Status Achieved      PT LONG TERM GOAL #2   Title Increase FOTO to 70% to allow pt to be more independent with hobbies.    Status Achieved      PT LONG TERM GOAL #3   Title Pt will be able to return to shorter hiking trails without an increase of R foot pain.    Status Achieved      PT LONG TERM GOAL #4   Title Increase R foot strength to 4+/5 to allow pt to negotiate steps..    Status Achieved      PT LONG TERM GOAL #5   Title Pt will be able to negotiate a flight of stairs with reciprocal gait pattern without increased pain to allow her to do laundry in her basement.    Status Achieved      PT LONG TERM GOAL #6   Title decrease neck and HA pain 50%    Status Achieved                   Plan - 11/03/21 1428     Clinical Impression Statement Pt enters the clinic feeling well. She was able to progress to a more active session with light resistance. Postural reassurance given throughout session with all active movements. Good bilateral UE  ROM with ER. Some fatigue reported with cervical retractions. Positive response to heavy pressure in the upper R trap.  Lead PT assisted in treatment providing DN.    Examination-Participation Restrictions Yard Work    Stability/Clinical Decision Making Stable/Uncomplicated    Rehab Potential Good    PT Frequency 1x / week    PT Duration 8 weeks    PT Treatment/Interventions ADLs/Self Care Home Management;Cryotherapy;Electrical Stimulation;Iontophoresis 4mg /ml Dexamethasone;Moist Heat;Ultrasound;Gait training;Stair training;Functional mobility training;Therapeutic activities;Therapeutic exercise;Balance training;Patient/family education;Manual techniques;Scar mobilization;Passive range of motion;Dry needling;Taping;Vasopneumatic Device;Joint Manipulations    PT Next Visit Plan Treat acute pain in R upper trunk, scapular stability, postural strengthening. DN             Patient will benefit from skilled therapeutic intervention in order to improve the following deficits and impairments:  Decreased range of motion, Difficulty walking, Increased muscle spasms, Decreased activity tolerance, Pain, Decreased balance, Decreased strength, Increased edema  Visit Diagnosis: Cervicalgia  Muscle weakness (generalized)  Localized edema  Cramp and spasm     Problem List Patient Active Problem List   Diagnosis Date Noted   History of non anemic vitamin B12 deficiency 08/26/2021   Gastroesophageal reflux disease 08/26/2021   History of colon polyps 08/26/2021   Abnormal mammogram of left breast 07/13/2021   Epidermoid cyst of skin 08/06/2019   Peroneal tendinitis of right lower leg 02/28/2019   Vitamin D deficiency 09/01/2017   Iron deficiency 07/08/2017   Hypothyroidism 05/27/2017   Midline cystocele 10/31/2014   Urethrocele 10/31/2014   Coccyx pain 10/17/2014   Proteinuria 10/17/2014   Rectocele 10/17/2014    Scot Jun, PTA 11/03/2021, 2:31 PM  [place]. [place], [place], [address] Phone: [phone]   Fax:  [phone]  Name: Sabrina Mcdaniel  [place] MRN: [id number] Date of Birth: 07/27/1966

## 2021-11-10 ENCOUNTER — Other Ambulatory Visit: Payer: Self-pay

## 2021-11-10 ENCOUNTER — Encounter: Payer: Self-pay | Admitting: Physical Therapy

## 2021-11-10 ENCOUNTER — Ambulatory Visit: Payer: Federal, State, Local not specified - PPO | Admitting: Physical Therapy

## 2021-11-10 DIAGNOSIS — M542 Cervicalgia: Secondary | ICD-10-CM

## 2021-11-10 DIAGNOSIS — R600 Localized edema: Secondary | ICD-10-CM | POA: Diagnosis not present

## 2021-11-10 DIAGNOSIS — R262 Difficulty in walking, not elsewhere classified: Secondary | ICD-10-CM | POA: Diagnosis not present

## 2021-11-10 DIAGNOSIS — R252 Cramp and spasm: Secondary | ICD-10-CM

## 2021-11-10 DIAGNOSIS — M25571 Pain in right ankle and joints of right foot: Secondary | ICD-10-CM | POA: Diagnosis not present

## 2021-11-10 DIAGNOSIS — M6281 Muscle weakness (generalized): Secondary | ICD-10-CM

## 2021-11-10 DIAGNOSIS — M25671 Stiffness of right ankle, not elsewhere classified: Secondary | ICD-10-CM | POA: Diagnosis not present

## 2021-11-10 NOTE — Therapy (Signed)
[place]. [place], [place], [address] Phone: [phone]   Fax:  [phone]  Physical Therapy Treatment  Patient Details  Name: Sabrina Mcdaniel MRN: [id number] Date of Birth: 1966-02-09 Referring Provider (PT): Rudd   Encounter Date: 11/10/2021   PT End of Session - 11/10/21 1335     Visit Number 21    Date for PT Re-Evaluation 11/14/21    PT Start Time 1300    PT Stop Time 1345    PT Time Calculation (min) 45 min    Activity Tolerance Patient tolerated treatment well    Behavior During Therapy [facility] for tasks assessed/performed             Past Medical History:  Diagnosis Date   Anemia    Iron deficiency   B12 deficiency    Cholelithiasis    Depression    GERD (gastroesophageal reflux disease)    Hiatal hernia 2014   Menopause 2012   Migraines    Osteopenia    Plantar fasciitis 2017   Thyroid disease    hypo   Vitamin D deficiency     Past Surgical History:  Procedure Laterality Date   ABDOMINAL HYSTERECTOMY  2009   anterior and posterior repair   COLONOSCOPY  2011   hyperplastic polyps   ESOPHAGOGASTRODUODENOSCOPY ENDOSCOPY  2013   LASIK  2008   PLANTAR FASCIA SURGERY Left 2018   REPAIR PERONEAL TENDONS ANKLE Right 03/27/2021   TONSILLECTOMY  2000   TUBAL LIGATION  1999    There were no vitals filed for this visit.   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  Improved mobility    Currently in Pain? Yes    Pain Score 1     Pain Location Neck    Pain Orientation Left                               [facility] Adult PT Treatment/Exercise - 11/10/21 0001       Neck Exercises: Machines for Strengthening   UBE (Upper Arm Bike) L1.3 x 2 min each    Other Machines for Strengthening Rows  & Lats 20lb 2x10      Neck Exercises: Theraband   Shoulder External Rotation 20 reps;Green       Neck Exercises: Standing   Neck Retraction 20 reps;3 secs   yellow   Other Standing Exercises Rows red 2x10    Other Standing Exercises Ext red 2x10      Modalities   Modalities Moist Heat;Electrical Stimulation      Moist Heat Therapy   Number Minutes Moist Heat 10 Minutes    Moist Heat Location Cervical      Manual Therapy   Soft tissue mobilization R up trap, scalenes, SCM, paraspinals on R.              Trigger Point Dry Needling - 11/10/21 0001     Consent Given? Yes    Upper Trapezius Response Twitch reponse elicited;Palpable increased muscle length                     PT Short Term Goals - 10/30/21 1148       PT SHORT TERM GOAL #1   Title Pt will be independent with initial HEP.    Status Achieved  PT SHORT TERM GOAL #2   Title Paitent will be I with initial HEP for her neck.    Time 3    Period Weeks    Status New    Target Date 11/20/21               PT Long Term Goals - 10/14/21 1440       PT LONG TERM GOAL #1   Title Pt will be independent with advanced HEP.    Status Achieved      PT LONG TERM GOAL #2   Title Increase FOTO to 70% to allow pt to be more independent with hobbies.    Status Achieved      PT LONG TERM GOAL #3   Title Pt will be able to return to shorter hiking trails without an increase of R foot pain.    Status Achieved      PT LONG TERM GOAL #4   Title Increase R foot strength to 4+/5 to allow pt to negotiate steps..    Status Achieved      PT LONG TERM GOAL #5   Title Pt will be able to negotiate a flight of stairs with reciprocal gait pattern without increased pain to allow her to do laundry in her basement.    Status Achieved      PT LONG TERM GOAL #6   Title decrease neck and HA pain 50%    Status Achieved                   Plan - 11/10/21 1336     Clinical Impression Statement Lead PT assisted in session providing DN to PT. Pt able to progress to machine level interventions without  issue. Increase resistance tolerated with standing ER, rows, and extensions. Some cervical weakness noted with Tband retraction.    Examination-Participation Restrictions Yard Work    Stability/Clinical Decision Making Stable/Uncomplicated    Rehab Potential Good    PT Frequency 1x / week    PT Duration 8 weeks    PT Treatment/Interventions ADLs/Self Care Home Management;Cryotherapy;Electrical Stimulation;Iontophoresis 4mg /ml Dexamethasone;Moist Heat;Ultrasound;Gait training;Stair training;Functional mobility training;Therapeutic activities;Therapeutic exercise;Balance training;Patient/family education;Manual techniques;Scar mobilization;Passive range of motion;Dry needling;Taping;Vasopneumatic Device;Joint Manipulations    PT Next Visit Plan Treat acute pain in R upper trunk, scapular stability, postural strengthening. DN             Patient will benefit from skilled therapeutic intervention in order to improve the following deficits and impairments:  Decreased range of motion, Difficulty walking, Increased muscle spasms, Decreased activity tolerance, Pain, Decreased balance, Decreased strength, Increased edema  Visit Diagnosis: Cervicalgia  Localized edema  Cramp and spasm  Muscle weakness (generalized)     Problem List Patient Active Problem List   Diagnosis Date Noted   History of non anemic vitamin B12 deficiency 08/26/2021   Gastroesophageal reflux disease 08/26/2021   History of colon polyps 08/26/2021   Abnormal mammogram of left breast 07/13/2021   Epidermoid cyst of skin 08/06/2019   Peroneal tendinitis of right lower leg 02/28/2019   Vitamin D deficiency 09/01/2017   Iron deficiency 07/08/2017   Hypothyroidism 05/27/2017   Midline cystocele 10/31/2014   Urethrocele 10/31/2014   Coccyx pain 10/17/2014   Proteinuria 10/17/2014   Rectocele 10/17/2014    Scot Jun, PTA 11/10/2021, 1:38 PM  [place]. [place], [place], [address] Phone: [phone]   Fax:  [phone]  Name: Sabrina Mcdaniel MRN:  [id number] Date of Birth: 11/19/1965

## 2021-11-12 ENCOUNTER — Encounter: Payer: Self-pay | Admitting: Physical Therapy

## 2021-11-12 ENCOUNTER — Ambulatory Visit: Payer: Federal, State, Local not specified - PPO | Admitting: Physical Therapy

## 2021-11-12 ENCOUNTER — Other Ambulatory Visit: Payer: Self-pay

## 2021-11-12 DIAGNOSIS — M542 Cervicalgia: Secondary | ICD-10-CM

## 2021-11-12 DIAGNOSIS — M6281 Muscle weakness (generalized): Secondary | ICD-10-CM

## 2021-11-12 DIAGNOSIS — R262 Difficulty in walking, not elsewhere classified: Secondary | ICD-10-CM

## 2021-11-12 DIAGNOSIS — M25671 Stiffness of right ankle, not elsewhere classified: Secondary | ICD-10-CM

## 2021-11-12 DIAGNOSIS — M25571 Pain in right ankle and joints of right foot: Secondary | ICD-10-CM

## 2021-11-12 DIAGNOSIS — R252 Cramp and spasm: Secondary | ICD-10-CM

## 2021-11-12 DIAGNOSIS — R600 Localized edema: Secondary | ICD-10-CM | POA: Diagnosis not present

## 2021-11-12 NOTE — Therapy (Signed)
[place]. [place], [place], [address] Phone: [phone]   Fax:  [phone]  Physical Therapy Treatment  Patient Details  Name: Sabrina Mcdaniel MRN: [id number] Date of Birth: June 08, 1966 Referring Provider (PT): Rudd   Encounter Date: 11/12/2021   PT End of Session - 11/12/21 1659     Visit Number 22    Date for PT Re-Evaluation 11/14/21    PT Start Time 1611    PT Stop Time 1657    PT Time Calculation (min) 46 min    Activity Tolerance Patient tolerated treatment well    Behavior During Therapy [facility] for tasks assessed/performed             Past Medical History:  Diagnosis Date   Anemia    Iron deficiency   B12 deficiency    Cholelithiasis    Depression    GERD (gastroesophageal reflux disease)    Hiatal hernia 2014   Menopause 2012   Migraines    Osteopenia    Plantar fasciitis 2017   Thyroid disease    hypo   Vitamin D deficiency     Past Surgical History:  Procedure Laterality Date   ABDOMINAL HYSTERECTOMY  2009   anterior and posterior repair   COLONOSCOPY  2011   hyperplastic polyps   ESOPHAGOGASTRODUODENOSCOPY ENDOSCOPY  2013   LASIK  2008   PLANTAR FASCIA SURGERY Left 2018   REPAIR PERONEAL TENDONS ANKLE Right 03/27/2021   TONSILLECTOMY  2000   TUBAL LIGATION  1999    There were no vitals filed for this visit.   Subjective Assessment - 11/12/21 1656     Subjective Reports that she is again feeling good and is thinking about this being the last visit    Currently in Pain? Yes    Pain Score 2     Pain Location Neck    Pain Orientation Right    Pain Descriptors / Indicators Spasm;Tightness    Aggravating Factors  turing head, being on computer    Pain Relieving Factors treatment helps                               [facility] Adult PT Treatment/Exercise - 11/12/21 0001       Manual Therapy   Manual Therapy Manual Traction;Passive ROM;Soft tissue  mobilization    Soft tissue mobilization R up trap, scalenes, SCM, paraspinals on R.    Passive ROM neck stretch, scalenes, SCM, up traps    Manual Traction occipital release, manual traction                       PT Short Term Goals - 10/30/21 1148       PT SHORT TERM GOAL #1   Title Pt will be independent with initial HEP.    Status Achieved      PT SHORT TERM GOAL #2   Title Paitent will be I with initial HEP for her neck.    Time 3    Period Weeks    Status New    Target Date 11/20/21               PT Long Term Goals - 11/12/21 1702       PT LONG TERM GOAL #1   Title Pt will be independent with advanced HEP.    Status Achieved      PT LONG TERM  GOAL #2   Title Increase FOTO to 70% to allow pt to be more independent with hobbies.    Status Achieved      PT LONG TERM GOAL #3   Title Pt will be able to return to shorter hiking trails without an increase of R foot pain.    Status Achieved      PT LONG TERM GOAL #4   Title Increase R foot strength to 4+/5 to allow pt to negotiate steps..    Status Achieved      PT LONG TERM GOAL #5   Title Pt will be able to negotiate a flight of stairs with reciprocal gait pattern without increased pain to allow her to do laundry in her basement.    Status Achieved      PT LONG TERM GOAL #6   Title decrease neck and HA pain 50%    Status Achieved                   Plan - 11/12/21 1700     Clinical Impression Statement Patient overall doing well, we talked about putting her on hold and different strategies that we can do to alleviate some pain or prevent the issues, she feels like she has good ideas on this and feels that she is better.    PT Next Visit Plan will hold as she is feeling good, if she needs something in the next 3-4 weeks we will get her back in, if not we will d/c    Consulted and Agree with Plan of Care Patient             Patient will benefit from skilled therapeutic  intervention in order to improve the following deficits and impairments:  Decreased range of motion, Difficulty walking, Increased muscle spasms, Decreased activity tolerance, Pain, Decreased balance, Decreased strength, Increased edema  Visit Diagnosis: Cervicalgia  Localized edema  Cramp and spasm  Muscle weakness (generalized)  Pain in joint involving right ankle and foot  Stiffness of right ankle, not elsewhere classified  Difficulty in walking, not elsewhere classified     Problem List Patient Active Problem List   Diagnosis Date Noted   History of non anemic vitamin B12 deficiency 08/26/2021   Gastroesophageal reflux disease 08/26/2021   History of colon polyps 08/26/2021   Abnormal mammogram of left breast 07/13/2021   Epidermoid cyst of skin 08/06/2019   Peroneal tendinitis of right lower leg 02/28/2019   Vitamin D deficiency 09/01/2017   Iron deficiency 07/08/2017   Hypothyroidism 05/27/2017   Midline cystocele 10/31/2014   Urethrocele 10/31/2014   Coccyx pain 10/17/2014   Proteinuria 10/17/2014   Rectocele 10/17/2014    Sumner Boast, PT 11/12/2021, 5:03 PM  [place]. [place], [place], [address] Phone: [phone]   Fax:  [phone]  Name: ETTAMAE BARKETT MRN: [id number] Date of Birth: 31-Aug-1966

## 2021-11-17 ENCOUNTER — Ambulatory Visit: Payer: Federal, State, Local not specified - PPO | Admitting: Physical Therapy

## 2021-12-02 ENCOUNTER — Encounter: Payer: Self-pay | Admitting: Physical Therapy

## 2022-01-12 ENCOUNTER — Ambulatory Visit (INDEPENDENT_AMBULATORY_CARE_PROVIDER_SITE_OTHER): Payer: Federal, State, Local not specified - PPO

## 2022-01-12 DIAGNOSIS — Z9889 Other specified postprocedural states: Secondary | ICD-10-CM

## 2022-01-12 DIAGNOSIS — M7671 Peroneal tendinitis, right leg: Secondary | ICD-10-CM | POA: Diagnosis not present

## 2022-01-12 DIAGNOSIS — M722 Plantar fascial fibromatosis: Secondary | ICD-10-CM

## 2022-01-12 NOTE — Progress Notes (Signed)
SITUATION ?Reason for Consult: Evaluation for Bilateral Custom Foot Orthoses ?Patient / Caregiver Report: Patient is ready for foot orthotics ? ?OBJECTIVE DATA: ?Patient History / Diagnosis:  ?  ICD-10-CM   ?1. Peroneal tendinitis of right lower leg  M76.71   ?  ?2. Plantar fasciitis, right  M72.2   ?  ?3. Status post right foot surgery  Z98.890   ?  ? ? ?Current or Previous Devices:   Current user ? ?Foot Examination: ?Skin presentation:   Intact ?Ulcers & Callousing:   None ?Toe / Foot Deformities:  None ?Weight Bearing Presentation:  Rectus ?Sensation:    Intact ? ?Shoe Size:    46M ? ?ORTHOTIC RECOMMENDATION ?Recommended Device: 1x pair of custom functional foot orthotics ? ?GOALS OF ORTHOSES ?- Reduce Pain ?- Prevent Foot Deformity ?- Prevent Progression of Further Foot Deformity ?- Relieve Pressure ?- Improve the Overall Biomechanical Function of the Foot and Lower Extremity. ? ?ACTIONS PERFORMED ?Potential out of pocket cost was communicated to patient. Patient understood and consent to casting. Patient was casted for Foot Orthoses via crush box. Procedure was explained and patient tolerated procedure well. Casts were shipped to central fabrication. All questions were answered and concerns addressed. ? ?PLAN ?Patient is to be called for fitting when devices are ready.  ? ? ?

## 2022-02-09 ENCOUNTER — Ambulatory Visit: Payer: Federal, State, Local not specified - PPO

## 2022-02-09 DIAGNOSIS — M722 Plantar fascial fibromatosis: Secondary | ICD-10-CM

## 2022-02-09 DIAGNOSIS — M7671 Peroneal tendinitis, right leg: Secondary | ICD-10-CM

## 2022-02-09 NOTE — Progress Notes (Signed)
SITUATION: Reason for Visit: Fitting and Delivery of Custom Fabricated Foot Orthoses Patient Report: Patient reports comfort and is satisfied with device.  OBJECTIVE DATA: Patient History / Diagnosis:     ICD-10-CM   1. Plantar fasciitis, right  M72.2     2. Peroneal tendinitis of right lower leg  M76.71       Provided Device:  Custom Functional Foot Orthotics     [employer]: [id number]  GOAL OF ORTHOSIS - Improve gait - Decrease energy expenditure - Improve Balance - Provide Triplanar stability of foot complex - Facilitate motion  ACTIONS PERFORMED Patient was fit with foot orthotics trimmed to shoe last. Patient tolerated fittign procedure.   Patient was provided with verbal and written instruction and demonstration regarding donning, doffing, wear, care, proper fit, function, purpose, cleaning, and use of the orthosis and in all related precautions and risks and benefits regarding the orthosis.  Patient was also provided with verbal instruction regarding how to report any failures or malfunctions of the orthosis and necessary follow up care. Patient was also instructed to contact our office regarding any change in status that may affect the function of the orthosis.  Patient demonstrated independence with proper donning, doffing, and fit and verbalized understanding of all instructions.  PLAN: Patient is to follow up in one week or as necessary (PRN). All questions were answered and concerns addressed. Plan of care was discussed with and agreed upon by the patient.

## 2022-02-11 ENCOUNTER — Ambulatory Visit: Payer: Federal, State, Local not specified - PPO

## 2022-02-11 DIAGNOSIS — M722 Plantar fascial fibromatosis: Secondary | ICD-10-CM

## 2022-02-11 NOTE — Progress Notes (Addendum)
SITUATION Reason for Consult: Follow-up with foot orthotics Patient / Caregiver Report: patient wants foot orthotics redone  OBJECTIVE DATA History / Diagnosis:    ICD-10-CM   1. Plantar fasciitis, right  M72.2       Change in Pathology: none  ACTIONS PERFORMED Patient's equipment was checked for structural stability and fit. Patient provided previous set for reference and they are polypro flex 1.53m spenco. Will have them remade.  PLAN Patient to return in 6 weeks for pickup. Plan of care discussed with and agreed upon by patient / caregiver.

## 2022-03-25 ENCOUNTER — Other Ambulatory Visit: Payer: Federal, State, Local not specified - PPO

## 2022-04-08 IMAGING — MG MM DIGITAL DIAGNOSTIC UNILAT*L* W/ TOMO W/ CAD
6 series · 6 of 18 positions shown · non-contrast
Comparison: Previous exam(s).

CLINICAL DATA: Patient recalled from screening for left breast
asymmetry.

EXAM:
DIGITAL DIAGNOSTIC UNILATERAL LEFT MAMMOGRAM WITH TOMOSYNTHESIS AND
CAD
TECHNIQUE: Left digital diagnostic mammography and breast tomosynthesis was
performed. The images were evaluated with computer-aided detection.

[L CC synth-2D]
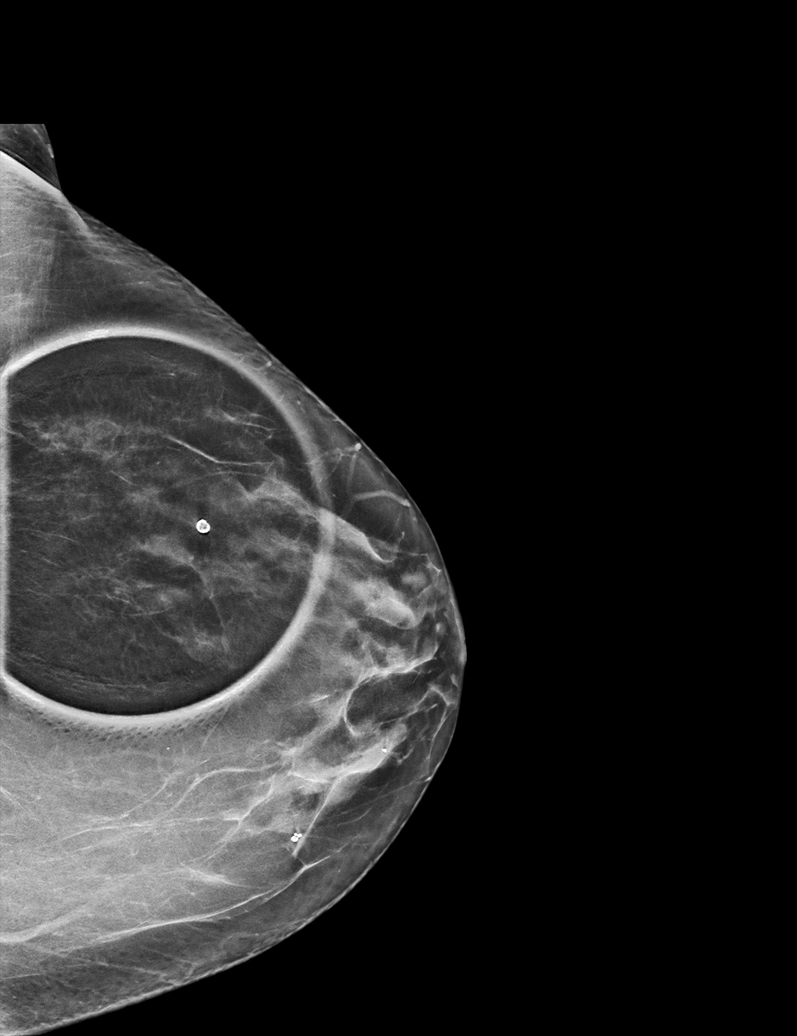

[L MLO synth-2D]
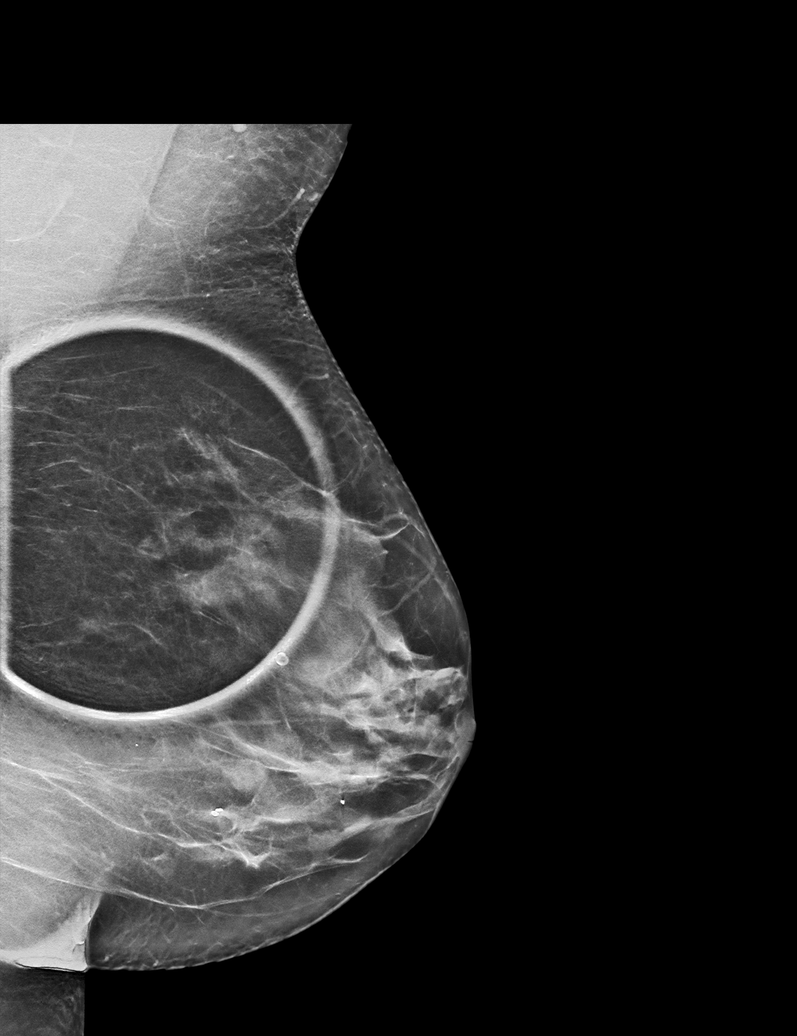

[L ML synth-2D]
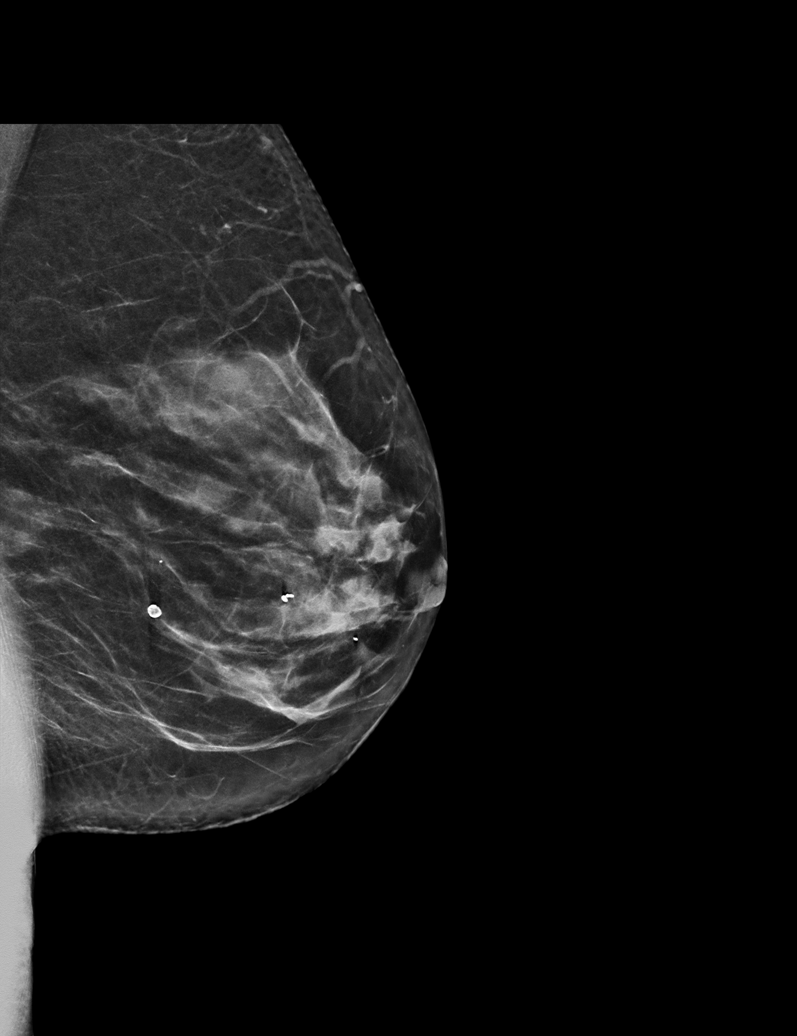

[L CC tomo · tomo slice 27/54.0]
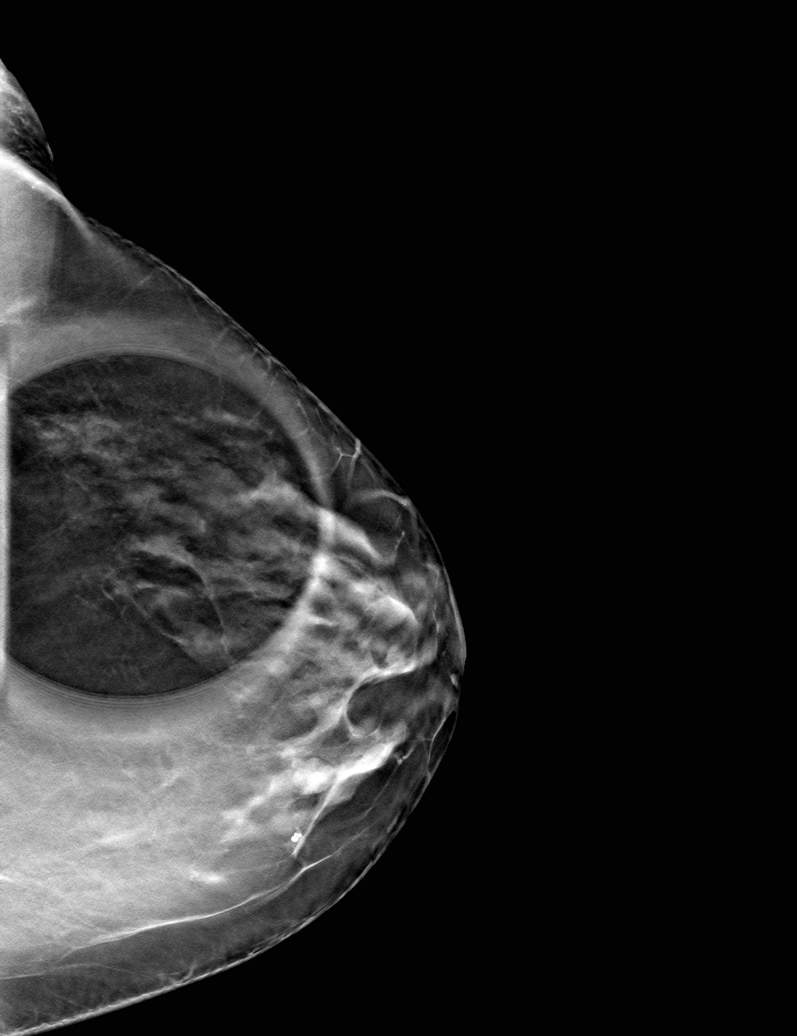

[L ML tomo · tomo slice 31/60.0]
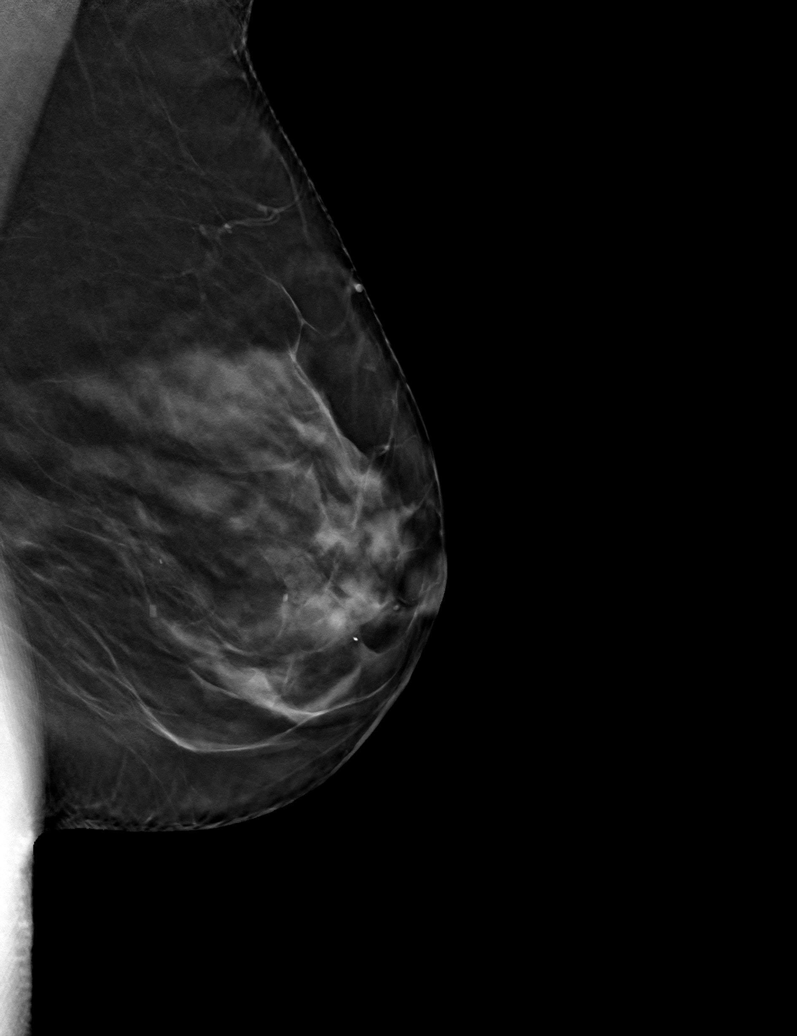

[L MLO tomo · tomo slice 31/60.0]
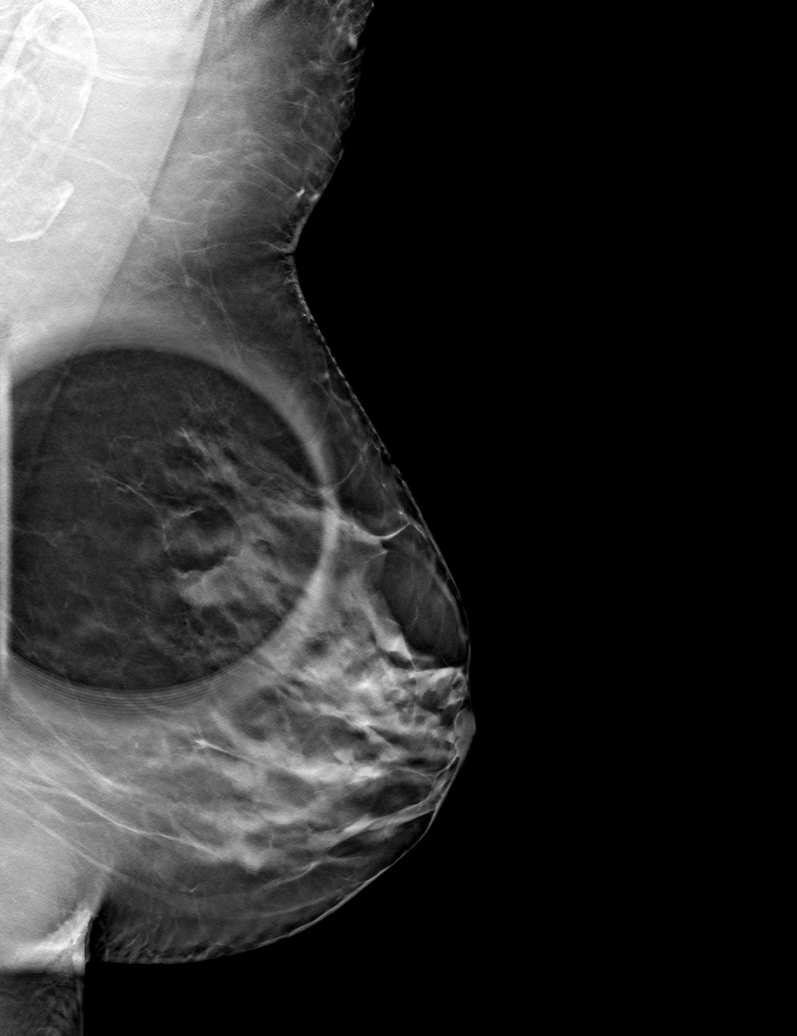

[6 of 18 positions shown; findings below may reference images not displayed]

ACR Breast Density Category c: The breast tissue is heterogeneously
dense, which may obscure small masses.
FINDINGS: Questioned asymmetry within the left breast resolved with additional
imaging compatible with dense overlapping fibroglandular tissue. No
suspicious findings on additional imaging.
IMPRESSION: No mammographic evidence for malignancy.

RECOMMENDATION:
Screening mammogram in one year.(Code:[id number])

I have discussed the findings and recommendations with the patient.
If applicable, a reminder letter will be sent to the patient
regarding the next appointment.

BI-RADS CATEGORY  1: Negative.

## 2022-04-19 DIAGNOSIS — L821 Other seborrheic keratosis: Secondary | ICD-10-CM | POA: Diagnosis not present

## 2022-04-19 DIAGNOSIS — D225 Melanocytic nevi of trunk: Secondary | ICD-10-CM | POA: Diagnosis not present

## 2022-04-19 DIAGNOSIS — S40862A Insect bite (nonvenomous) of left upper arm, initial encounter: Secondary | ICD-10-CM | POA: Diagnosis not present

## 2022-04-19 DIAGNOSIS — L814 Other melanin hyperpigmentation: Secondary | ICD-10-CM | POA: Diagnosis not present

## 2022-04-29 ENCOUNTER — Other Ambulatory Visit: Payer: Self-pay | Admitting: Family Medicine

## 2022-04-29 DIAGNOSIS — E559 Vitamin D deficiency, unspecified: Secondary | ICD-10-CM

## 2022-05-17 MED ORDER — VITAMIN D (ERGOCALCIFEROL) 1.25 MG (50000 UNIT) PO CAPS: 50000.0000 [IU] | ORAL_CAPSULE | ORAL | 3 refills | Status: DC

## 2022-05-17 NOTE — Addendum Note (Signed)
Addended by: Konrad Saha on: 05/17/2022 10:34 AM   Modules accepted: Orders

## 2022-05-17 NOTE — Telephone Encounter (Signed)
RX refill sent to the pharmacy.  Dm/cma

## 2022-07-13 ENCOUNTER — Other Ambulatory Visit: Payer: Self-pay | Admitting: Family Medicine

## 2022-07-13 DIAGNOSIS — Z1231 Encounter for screening mammogram for malignant neoplasm of breast: Secondary | ICD-10-CM

## 2022-08-25 ENCOUNTER — Other Ambulatory Visit: Payer: Self-pay | Admitting: Family Medicine

## 2022-08-25 DIAGNOSIS — E039 Hypothyroidism, unspecified: Secondary | ICD-10-CM

## 2022-08-25 DIAGNOSIS — Z8639 Personal history of other endocrine, nutritional and metabolic disease: Secondary | ICD-10-CM

## 2022-09-02 ENCOUNTER — Ambulatory Visit (INDEPENDENT_AMBULATORY_CARE_PROVIDER_SITE_OTHER): Payer: Federal, State, Local not specified - PPO | Admitting: Family Medicine

## 2022-09-02 ENCOUNTER — Encounter: Payer: Self-pay | Admitting: Family Medicine

## 2022-09-02 VITALS — BP 122/80 | HR 64 | Temp 97.9°F | Ht 63.25 in | Wt 190.4 lb

## 2022-09-02 DIAGNOSIS — E538 Deficiency of other specified B group vitamins: Secondary | ICD-10-CM | POA: Diagnosis not present

## 2022-09-02 DIAGNOSIS — Z0000 Encounter for general adult medical examination without abnormal findings: Secondary | ICD-10-CM

## 2022-09-02 DIAGNOSIS — Z13220 Encounter for screening for lipoid disorders: Secondary | ICD-10-CM | POA: Diagnosis not present

## 2022-09-02 DIAGNOSIS — Z23 Encounter for immunization: Secondary | ICD-10-CM | POA: Diagnosis not present

## 2022-09-02 DIAGNOSIS — Z1159 Encounter for screening for other viral diseases: Secondary | ICD-10-CM

## 2022-09-02 DIAGNOSIS — E039 Hypothyroidism, unspecified: Secondary | ICD-10-CM

## 2022-09-02 LAB — LIPID PANEL
Cholesterol: 195 mg/dL (ref 0–200)
HDL: 62.3 mg/dL (ref >39.00)
LDL Cholesterol: 111 mg/dL — ABNORMAL HIGH (ref 0–99)
NonHDL: 132.92
Total CHOL/HDL Ratio: 3
Triglycerides: 108 mg/dL (ref 0.0–149.0)
VLDL: 21.6 mg/dL (ref 0.0–40.0)

## 2022-09-02 LAB — TSH: TSH: 3.1 u[IU]/mL (ref 0.35–5.50)

## 2022-09-02 LAB — VITAMIN B12: Vitamin B-12: 536 pg/mL (ref 211–911)

## 2022-09-02 MED ORDER — LEVOTHYROXINE SODIUM 75 MCG PO TABS: 75.0000 ug | ORAL_TABLET | Freq: Every day | ORAL | 3 refills | Status: DC

## 2022-09-02 MED ORDER — CYANOCOBALAMIN 1000 MCG/ML IJ SOLN: 100.0000 ug | INTRAMUSCULAR | 1 refills | Status: DC

## 2022-09-02 NOTE — Progress Notes (Signed)
[facility] [address] [place] [address] Dept: [phone] Dept Fax: [phone]  Annual Physical Visit  Subjective:    Patient ID: Sabrina Mcdaniel, female    DOB: February 28, 1966, 56 y.o..   MRN: [id number]  Chief Complaint  Patient presents with   Annual Exam    CPE/labs.  No concerns.   Fasting today.     History of Present Illness:  Patient is in today for an annual physical/preventative visit.  Sabrina Mcdaniel has a history of hypothyroidism. She is managed on levothyroxine 75 mcg daily.   Sabrina Mcdaniel has a history of GERD. She is on Nexium. She previously did try and stop the medication. She experienced some acid rebound, but the reflux remained persistent.   Sabrina Mcdaniel has a history of Vitamin B12 deficiency. She was tried on oral B12, but her deficiency returned. She continues monthly injections.  Review of Systems  Constitutional:  Negative for chills, diaphoresis, fever, malaise/fatigue and weight loss.  HENT:  Negative for congestion, ear pain, hearing loss, sinus pain and sore throat.   Eyes:  Negative for blurred vision, pain, discharge and redness.  Respiratory:  Negative for cough, shortness of breath and wheezing.   Cardiovascular:  Negative for chest pain, palpitations and leg swelling.  Gastrointestinal:  Negative for abdominal pain, constipation, diarrhea, heartburn, nausea and vomiting.  Musculoskeletal:  Positive for neck pain. Negative for back pain, joint pain and myalgias.       Notes some stiffness and soreness in the right neck. She feels this may be muscular. Using massage and stretches.  Skin:  Negative for itching and rash.  Psychiatric/Behavioral:  Negative for depression. The patient is not nervous/anxious.    Past Medical History: Patient Active Problem List   Diagnosis Date Noted   Vitamin B12 deficiency 08/26/2021   Gastroesophageal reflux disease 08/26/2021   History of colon polyps 08/26/2021    Abnormal mammogram of left breast 07/13/2021   Epidermoid cyst of skin 08/06/2019   Peroneal tendinitis of right lower leg 02/28/2019   Vitamin D deficiency 09/01/2017   Iron deficiency 07/08/2017   Hypothyroidism 05/27/2017   Midline cystocele 10/31/2014   Urethrocele 10/31/2014   Coccyx pain 10/17/2014   Proteinuria 10/17/2014   Rectocele 10/17/2014   Past Surgical History:  Procedure Laterality Date   ABDOMINAL HYSTERECTOMY  2009   anterior and posterior repair   COLONOSCOPY  2011   hyperplastic polyps   ESOPHAGOGASTRODUODENOSCOPY ENDOSCOPY  2013   LASIK  2008   PLANTAR FASCIA SURGERY Left 2018   REPAIR PERONEAL TENDONS ANKLE Right 03/27/2021   TONSILLECTOMY  2000   TUBAL LIGATION  1999   Family History  Problem Relation Age of Onset   Cancer Mother 67       breast    Hypothyroidism Mother    Heart disease Father    Diabetes Father    Cancer Father        Acute myelogenous leukemia   Colon cancer Maternal Aunt    Stroke Maternal Grandmother    Cancer Maternal Grandfather        Prostate   Heart disease Paternal Grandmother    Stroke Paternal Grandfather    Colon polyps Neg Hx    Esophageal cancer Neg Hx    Rectal cancer Neg Hx    Stomach cancer Neg Hx    Outpatient Medications Prior to Visit  Medication Sig Dispense Refill   cyanocobalamin (VITAMIN B12) 1000 MCG/ML injection Use as  directed 1 mL 0   esomeprazole (NEXIUM) 20 MG capsule Take 20 mg by mouth daily at 12 noon.     ibuprofen (ADVIL) 200 MG tablet Take 200 mg by mouth every 6 (six) hours as needed.     levothyroxine (SYNTHROID) 75 MCG tablet Take 1 tablet by mouth once daily 30 tablet 0   Multiple Vitamins-Minerals (MULTIVITAMIN WITH MINERALS) tablet Take 1 tablet by mouth daily.     Vitamin D, Ergocalciferol, (DRISDOL) 1.25 MG (50000 UNIT) CAPS capsule Take 1 capsule (50,000 Units total) by mouth every 7 (seven) days. 12 capsule 3   Prenatal Vit-Fe Fumarate-FA (PRENATAL COMPLETE PO) Take by mouth.      triamcinolone cream (KENALOG) 0.1 % Apply 1 application topically 2 (two) times daily. 30 g 0   No facility-administered medications prior to visit.   No Known Allergies    Objective:   Today's Vitals   09/02/22 0814  BP: 122/80  Pulse: 64  Temp: 97.9 F (36.6 C)  TempSrc: Temporal  SpO2: 99%  Weight: 190 lb 6.4 oz (86.4 kg)  Height: 5' 3.25" (1.607 m)   Body mass index is 33.46 kg/m.   General: Well developed, well nourished. No acute distress. HEENT: Normocephalic, non-traumatic. PERRL, EOMI. Conjunctiva clear. External ears normal. EAC and   TMs normal bilaterally. Nose clear without congestion or rhinorrhea. Mucous membranes moist.   Oropharynx clear. Good dentition. Neck: Supple. No lymphadenopathy. No thyromegaly. Lungs: Clear to auscultation bilaterally. No wheezing, rales or rhonchi. CV: RRR without murmurs or rubs. Pulses 2+ bilaterally. Abdomen: Soft, non-tender. Bowel sounds positive, normal pitch and frequency. No hepatosplenomegaly.   No rebound or guarding. Extremities: Full ROM. No joint swelling or tenderness. No edema noted. Skin: Warm and dry. No rashes. Psych: Alert and oriented. Normal mood and affect.  Health Maintenance Due  Topic Date Due   HIV Screening  Never done   Hepatitis C Screening  Never done   DTaP/Tdap/Td (1 - Tdap) Never done     Assessment & Plan:   1. Annual physical exam Overall good health. Reviewed indicated screenings and immunizations.  2. Hypothyroidism, unspecified type Due for annual TSH.  - TSH - levothyroxine (SYNTHROID) 75 MCG tablet; Take 1 tablet (75 mcg total) by mouth daily before breakfast.  Dispense: 90 tablet; Refill: 3  3. Vitamin B12 deficiency Will continue to monitor B12.  - Vitamin B12 - cyanocobalamin (VITAMIN B12) 1000 MCG/ML injection; Inject 0.1 mLs (100 mcg total) into the muscle every 30 (thirty) days.  Dispense: 1 mL; Refill: 1  4. Screening for lipid disorders  - Lipid panel  5.  Encounter for hepatitis C screening test for low risk patient  - HCV Ab w Reflex to Quant PCR  6. Need for Tdap vaccination  - Tdap vaccine greater than or equal to 7yo IM   Return in about 1 year (around 09/03/2023) for Annual preventative care.   Sabrina Salter, MD

## 2022-09-03 DIAGNOSIS — Z1159 Encounter for screening for other viral diseases: Secondary | ICD-10-CM | POA: Diagnosis not present

## 2022-09-04 LAB — SPECIMEN STATUS REPORT

## 2022-09-04 LAB — HCV INTERPRETATION

## 2022-09-04 LAB — HCV AB W REFLEX TO QUANT PCR: HCV Ab: NONREACTIVE

## 2022-09-06 ENCOUNTER — Ambulatory Visit
Admission: RE | Admit: 2022-09-06 | Discharge: 2022-09-06 | Disposition: A | Discharge status: Home / Self Care | Payer: Federal, State, Local not specified - PPO | Source: Ambulatory Visit | Attending: Family Medicine | Admitting: Family Medicine

## 2022-09-06 DIAGNOSIS — Z1231 Encounter for screening mammogram for malignant neoplasm of breast: Secondary | ICD-10-CM | POA: Diagnosis not present

## 2023-01-13 DIAGNOSIS — L538 Other specified erythematous conditions: Secondary | ICD-10-CM | POA: Diagnosis not present

## 2023-01-13 DIAGNOSIS — L820 Inflamed seborrheic keratosis: Secondary | ICD-10-CM | POA: Diagnosis not present

## 2023-02-19 ENCOUNTER — Other Ambulatory Visit: Payer: Self-pay | Admitting: Family Medicine

## 2023-02-19 DIAGNOSIS — E538 Deficiency of other specified B group vitamins: Secondary | ICD-10-CM

## 2023-03-03 ENCOUNTER — Ambulatory Visit: Payer: Federal, State, Local not specified - PPO | Admitting: Family Medicine

## 2023-03-03 VITALS — BP 118/74 | HR 61 | Temp 97.6°F | Ht 63.25 in | Wt 191.0 lb

## 2023-03-03 DIAGNOSIS — J40 Bronchitis, not specified as acute or chronic: Secondary | ICD-10-CM | POA: Diagnosis not present

## 2023-03-03 MED ORDER — HYDROCODONE BIT-HOMATROP MBR 5-1.5 MG/5ML PO SOLN: 5.0000 mL | Freq: Three times a day (TID) | ORAL | 0 refills | Status: DC | PRN

## 2023-03-03 NOTE — Patient Instructions (Signed)

## 2023-03-03 NOTE — Assessment & Plan Note (Signed)
Discussed home care for viral illness, including rest, pushing fluids, and OTC medications as needed for symptom relief. Recommend she request Sudafed from the pharmacist. Recommend hot tea with honey for sore throat symptoms. I will add a cough medicine for use at night. Follow-up if needed for worsening or persistent symptoms.

## 2023-03-03 NOTE — Progress Notes (Signed)
[facility] [address] [place] [address] Dept: [phone] Dept Fax: [phone]  Office Visit  Subjective:    Patient ID: Sabrina Mcdaniel, female    DOB: 1966-06-24, 57 y.o..   MRN: [id number]  Chief Complaint  Patient presents with   Cough    C/o having sinus congestion, cough x 1-2 weeks.  Has taken Zicam, Robitussin DM   History of Present Illness:  Patient is in today with a cough. She notes she had first started having some nasal congestion a couple of weeks ago. By last week, this had progressed to her having rhinorrhea and some subjective fever. This led to post-nasal drop and a cough developing. Over the weekend, she felt suddenly worse, having some chills, itchy eyes, cough productive of yellow sputum, and mild dyspnea on exertion. She has been improving over the past 2 days, but continues to cough.  Past Medical History: Patient Active Problem List   Diagnosis Date Noted   Vitamin B12 deficiency 08/26/2021   Gastroesophageal reflux disease 08/26/2021   History of colon polyps 08/26/2021   Abnormal mammogram of left breast 07/13/2021   Epidermoid cyst of skin 08/06/2019   Peroneal tendinitis of right lower leg 02/28/2019   Vitamin D deficiency 09/01/2017   Iron deficiency 07/08/2017   Hypothyroidism 05/27/2017   Midline cystocele 10/31/2014   Urethrocele 10/31/2014   Coccyx pain 10/17/2014   Proteinuria 10/17/2014   Rectocele 10/17/2014   Past Surgical History:  Procedure Laterality Date   ABDOMINAL HYSTERECTOMY  2009   anterior and posterior repair   COLONOSCOPY  2011   hyperplastic polyps   ESOPHAGOGASTRODUODENOSCOPY ENDOSCOPY  2013   LASIK  2008   PLANTAR FASCIA SURGERY Left 2018   REPAIR PERONEAL TENDONS ANKLE Right 03/27/2021   TONSILLECTOMY  2000   TUBAL LIGATION  1999   Family History  Problem Relation Age of Onset   Cancer Mother 14       breast    Hypothyroidism Mother    Heart  disease Father    Diabetes Father    Cancer Father        Acute myelogenous leukemia   Colon cancer Maternal Aunt    Stroke Maternal Grandmother    Cancer Maternal Grandfather        Prostate   Heart disease Paternal Grandmother    Stroke Paternal Grandfather    Colon polyps Neg Hx    Esophageal cancer Neg Hx    Rectal cancer Neg Hx    Stomach cancer Neg Hx    Outpatient Medications Prior to Visit  Medication Sig Dispense Refill   cyanocobalamin (VITAMIN B12) 1000 MCG/ML injection INJECT 0.1 MLS INTO THE MUSCLE EVERY 30 DAYS 1 mL 12   esomeprazole (NEXIUM) 20 MG capsule Take 20 mg by mouth daily at 12 noon.     ibuprofen (ADVIL) 200 MG tablet Take 200 mg by mouth every 6 (six) hours as needed.     levothyroxine (SYNTHROID) 75 MCG tablet Take 1 tablet (75 mcg total) by mouth daily before breakfast. 90 tablet 3   Multiple Vitamins-Minerals (MULTIVITAMIN WITH MINERALS) tablet Take 1 tablet by mouth daily.     Vitamin D, Ergocalciferol, (DRISDOL) 1.25 MG (50000 UNIT) CAPS capsule Take 1 capsule (50,000 Units total) by mouth every 7 (seven) days. 12 capsule 3   No facility-administered medications prior to visit.   No Known Allergies   Objective:   Today's Vitals   03/03/23 0839  BP: 118/74  Pulse: 61  Temp: 97.6 F (36.4 C)  TempSrc: Temporal  SpO2: 95%  Weight: 191 lb (86.6 kg)  Height: 5' 3.25" (1.607 m)   Body mass index is 33.57 kg/m.   General: Well developed, well nourished. No acute distress. HEENT: Normocephalic, non-traumatic. Eyes clear. External ears normal. EAC and TMs normal   bilaterally. Nose with mild congestion but no rhinorrhea. Mucous membranes moist. Oropharynx clear.   Good dentition. Neck: Supple. No lymphadenopathy. No thyromegaly. Lungs: Mild intermittent rhonchi and wheeze, but clear with continue deep breaths. C Psych: Alert and oriented. Normal mood and affect.  Health Maintenance Due  Topic Date Due   HIV Screening  Never done      Assessment & Plan:   Problem List Items Addressed This Visit       Respiratory   Bronchitis - Primary    Discussed home care for viral illness, including rest, pushing fluids, and OTC medications as needed for symptom relief. Recommend she request Sudafed from the pharmacist. Recommend hot tea with honey for sore throat symptoms. I will add a cough medicine for use at night. Follow-up if needed for worsening or persistent symptoms.       Relevant Medications   HYDROcodone bit-homatropine (HYCODAN) 5-1.5 MG/5ML syrup    Return if symptoms worsen or fail to improve.   Loyola Mast, MD

## 2023-04-21 DIAGNOSIS — D2371 Other benign neoplasm of skin of right lower limb, including hip: Secondary | ICD-10-CM | POA: Diagnosis not present

## 2023-04-21 DIAGNOSIS — L570 Actinic keratosis: Secondary | ICD-10-CM | POA: Diagnosis not present

## 2023-04-21 DIAGNOSIS — L814 Other melanin hyperpigmentation: Secondary | ICD-10-CM | POA: Diagnosis not present

## 2023-04-21 DIAGNOSIS — L821 Other seborrheic keratosis: Secondary | ICD-10-CM | POA: Diagnosis not present

## 2023-05-06 ENCOUNTER — Ambulatory Visit: Payer: Federal, State, Local not specified - PPO | Admitting: Family Medicine

## 2023-05-06 ENCOUNTER — Encounter: Payer: Self-pay | Admitting: Family Medicine

## 2023-05-06 VITALS — BP 130/78 | HR 64 | Temp 98.1°F | Ht 63.25 in | Wt 191.0 lb

## 2023-05-06 DIAGNOSIS — L237 Allergic contact dermatitis due to plants, except food: Secondary | ICD-10-CM | POA: Insufficient documentation

## 2023-05-06 MED ORDER — PREDNISONE 20 MG PO TABS: ORAL_TABLET | ORAL | 0 refills | Status: AC

## 2023-05-06 NOTE — Progress Notes (Signed)
[facility] [address] [place] [address] Dept: [phone] Dept Fax: [phone]  Office Visit  Subjective:    Patient ID: Sabrina Mcdaniel, female    DOB: 24-May-1966, 57 y.o..   MRN: [id number]  Chief Complaint  Patient presents with   Rash    C/o having a rash on neck, abdomen and LT arm x 1 week.  Has been using hydrocortisone cream.     History of Present Illness:  Patient is in today with a 1-week history of rash on her left chin and neck. She notes this appeared as raised bumps with redness. She has had intense itching. She has subsequently developed rash on her left arm and her left abdomen of a similar nature. She has been using a poison ivy scrub and Calamine lotion, without improvement. She had been concerned that this might be shingles, as she does not have any known exposure to poison ivy. She is planning to attend a baby shower this weekend, and did not want to potentially expose a pregnant mother.  Past Medical History: Patient Active Problem List   Diagnosis Date Noted   Bronchitis 03/03/2023   Vitamin B12 deficiency 08/26/2021   Gastroesophageal reflux disease 08/26/2021   History of colon polyps 08/26/2021   Abnormal mammogram of left breast 07/13/2021   Epidermoid cyst of skin 08/06/2019   Peroneal tendinitis of right lower leg 02/28/2019   Vitamin D deficiency 09/01/2017   Iron deficiency 07/08/2017   Hypothyroidism 05/27/2017   Midline cystocele 10/31/2014   Urethrocele 10/31/2014   Coccyx pain 10/17/2014   Proteinuria 10/17/2014   Rectocele 10/17/2014   Past Surgical History:  Procedure Laterality Date   ABDOMINAL HYSTERECTOMY  2009   anterior and posterior repair   COLONOSCOPY  2011   hyperplastic polyps   ESOPHAGOGASTRODUODENOSCOPY ENDOSCOPY  2013   LASIK  2008   PLANTAR FASCIA SURGERY Left 2018   REPAIR PERONEAL TENDONS ANKLE Right 03/27/2021   TONSILLECTOMY  2000   TUBAL LIGATION   1999   Family History  Problem Relation Age of Onset   Cancer Mother 20       breast    Hypothyroidism Mother    Heart disease Father    Diabetes Father    Cancer Father        Acute myelogenous leukemia   Colon cancer Maternal Aunt    Stroke Maternal Grandmother    Cancer Maternal Grandfather        Prostate   Heart disease Paternal Grandmother    Stroke Paternal Grandfather    Colon polyps Neg Hx    Esophageal cancer Neg Hx    Rectal cancer Neg Hx    Stomach cancer Neg Hx    Outpatient Medications Prior to Visit  Medication Sig Dispense Refill   cyanocobalamin (VITAMIN B12) 1000 MCG/ML injection INJECT 0.1 MLS INTO THE MUSCLE EVERY 30 DAYS 1 mL 12   esomeprazole (NEXIUM) 20 MG capsule Take 20 mg by mouth daily at 12 noon.     ibuprofen (ADVIL) 200 MG tablet Take 200 mg by mouth every 6 (six) hours as needed.     levothyroxine (SYNTHROID) 75 MCG tablet Take 1 tablet (75 mcg total) by mouth daily before breakfast. 90 tablet 3   Multiple Vitamins-Minerals (MULTIVITAMIN WITH MINERALS) tablet Take 1 tablet by mouth daily.     Vitamin D, Ergocalciferol, (DRISDOL) 1.25 MG (50000 UNIT) CAPS capsule Take 1 capsule (50,000 Units total) by mouth every 7 (seven)  days. 12 capsule 3   HYDROcodone bit-homatropine (HYCODAN) 5-1.5 MG/5ML syrup Take 5 mLs by mouth every 8 (eight) hours as needed for cough. (Patient not taking: Reported on 05/06/2023) 120 mL 0   No facility-administered medications prior to visit.   No Known Allergies   Objective:   Today's Vitals   05/06/23 1550  BP: 130/78  Pulse: 64  Temp: 98.1 F (36.7 C)  TempSrc: Temporal  SpO2: 97%  Weight: 191 lb (86.6 kg)  Height: 5' 3.25" (1.607 m)   Body mass index is 33.57 kg/m.   General: Well developed, well nourished. No acute distress. Skin: Warm and dry. There are several clusters of erythematous papules on the chin and left neck with superficial   excoriation. There is a small area of similar papules on the left  upper arm and the left lower abdomen. Psych: Alert and oriented. Normal mood and affect.  Health Maintenance Due  Topic Date Due   HIV Screening  Never done     Assessment & Plan:   Problem List Items Addressed This Visit       Musculoskeletal and Integument   Poison ivy dermatitis - Primary    The rash appears to be a contact dermatitis, such as from poison ivy. The itching, appearance of the rash,  and the multiple locations of the rash would not be seen with shingles. I will start her on a tapering course of prednisone. Follow-up if not improving.      Relevant Medications   predniSONE (DELTASONE) 20 MG tablet    Return if symptoms worsen or fail to improve.   Loyola Mast, MD

## 2023-05-06 NOTE — Assessment & Plan Note (Signed)
The rash appears to be a contact dermatitis, such as from poison ivy. The itching, appearance of the rash,  and the multiple locations of the rash would not be seen with shingles. I will start her on a tapering course of prednisone. Follow-up if not improving.

## 2023-08-02 ENCOUNTER — Other Ambulatory Visit: Payer: Self-pay | Admitting: Family Medicine

## 2023-08-02 DIAGNOSIS — Z1231 Encounter for screening mammogram for malignant neoplasm of breast: Secondary | ICD-10-CM

## 2023-08-31 NOTE — Progress Notes (Signed)
[facility] [address] [place] [address] Phone: [phone] Subjective:   INadine Counts, am serving as a scribe for Dr. Antoine Primas.  I'm seeing this patient by the request  of:  Loyola Mast, MD  CC: knee injury   [id number]  07/18/2019 At this juncture did not have any good diagnosis at the moment.  Seems to have more of a sural nerve irritation with the lateral plantar nerve injury potentially.  At this time I do feel that a nerve conduction study would be helpful to see if there is any injury or impingement on the sural nerve that could be contributing either in the lateral gastroc area, fibular head, or more proximally even the lumbar spine.  Patient is in agreement the plan.  Discussed which activities to do which wants to avoid.  Patient will increase activity slowly.  Follow-up again in 4 to 8 weeks     Updated 09/01/2023 Zoanne Riles Looby is a 57 y.o. female coming in with complaint of knee injury. Pain in the back has stayed out the same. Has flares over Si joint bilateral, but mostly R.      Past Medical History:  Diagnosis Date   Anemia    Iron deficiency   B12 deficiency    Cholelithiasis    Depression    GERD (gastroesophageal reflux disease)    Hiatal hernia 2014   Menopause 2012   Migraines    Osteopenia    Plantar fasciitis 2017   Thyroid disease    hypo   Vitamin D deficiency    Past Surgical History:  Procedure Laterality Date   ABDOMINAL HYSTERECTOMY  2009   anterior and posterior repair   COLONOSCOPY  2011   hyperplastic polyps   ESOPHAGOGASTRODUODENOSCOPY ENDOSCOPY  2013   LASIK  2008   PLANTAR FASCIA SURGERY Left 2018   REPAIR PERONEAL TENDONS ANKLE Right 03/27/2021   TONSILLECTOMY  2000   TUBAL LIGATION  1999   Social History   Socioeconomic History   Marital status: Married    Spouse name: Not on file   Number of children: 2   Years of education: Not on file   Highest education level:  Associate degree: occupational, Scientist, product/process development, or vocational program  Occupational History   Occupation: Development worker, international aid    Comment: Pensions consultant  Tobacco Use   Smoking status: Former    Current packs/day: 0.00    Types: Cigarettes    Quit date: 02/14/1998    Years since quitting: 25.5   Smokeless tobacco: Never  Vaping Use   Vaping status: Never Used  Substance and Sexual Activity   Alcohol use: Not Currently   Drug use: No   Sexual activity: Yes  Other Topics Concern   Not on file  Social History Narrative   Not on file   Social Drivers of Health   Financial Resource Strain: Not on file  Food Insecurity: Not on file  Transportation Needs: Not on file  Physical Activity: Not on file  Stress: Not on file  Social Connections: Not on file   No Known Allergies Family History  Problem Relation Age of Onset   Cancer Mother 99       breast    Hypothyroidism Mother    Heart disease Father    Diabetes Father    Cancer Father        Acute myelogenous leukemia   Colon cancer Maternal Aunt    Stroke  Maternal Grandmother    Cancer Maternal Grandfather        Prostate   Heart disease Paternal Grandmother    Stroke Paternal Grandfather    Colon polyps Neg Hx    Esophageal cancer Neg Hx    Rectal cancer Neg Hx    Stomach cancer Neg Hx     Current Outpatient Medications (Endocrine & Metabolic):    levothyroxine (SYNTHROID) 75 MCG tablet, Take 1 tablet (75 mcg total) by mouth daily before breakfast.   Current Outpatient Medications (Respiratory):    HYDROcodone bit-homatropine (HYCODAN) 5-1.5 MG/5ML syrup, Take 5 mLs by mouth every 8 (eight) hours as needed for cough.  Current Outpatient Medications (Analgesics):    ibuprofen (ADVIL) 200 MG tablet, Take 200 mg by mouth every 6 (six) hours as needed.  Current Outpatient Medications (Hematological):    cyanocobalamin (VITAMIN B12) 1000 MCG/ML injection, INJECT 0.1 MLS INTO THE MUSCLE EVERY 30  DAYS  Current Outpatient Medications (Other):    esomeprazole (NEXIUM) 20 MG capsule, Take 20 mg by mouth daily at 12 noon.   Multiple Vitamins-Minerals (MULTIVITAMIN WITH MINERALS) tablet, Take 1 tablet by mouth daily.   Vitamin D, Ergocalciferol, (DRISDOL) 1.25 MG (50000 UNIT) CAPS capsule, Take 1 capsule (50,000 Units total) by mouth every 7 (seven) days.   Reviewed prior external information including notes and imaging from  primary care provider As well as notes that were available from care everywhere and other healthcare systems.  Past medical history, social, surgical and family history all reviewed in electronic medical record.  No pertanent information unless stated regarding to the chief complaint.   Review of Systems:  No headache, visual changes, nausea, vomiting, diarrhea, constipation, dizziness, abdominal pain, skin rash, fevers, chills, night sweats, weight loss, swollen lymph nodes, body aches, joint swelling, chest pain, shortness of breath, mood changes. POSITIVE muscle aches  Objective  Blood pressure 126/84, pulse 65, height 5\' 3"  (1.6 m), weight 191 lb (86.6 kg), SpO2 96%.   General: No apparent distress alert and oriented x3 mood and affect normal, dressed appropriately.  HEENT: Pupils equal, extraocular movements intact  Respiratory: Patient's speak in full sentences and does not appear short of breath  Cardiovascular: No lower extremity edema, non tender, no erythema   Knee exam shows patient does have mild swelling on the anterior aspect of the knee compared to the contralateral side.  Patient has relatively good range of motion but is lacking the last 5 degrees of flexion secondary to tightness.  Limited muscular skeletal ultrasound was performed and interpreted by Antoine Primas, M  Limited hypoechoic changes show the patient still has a small hypoechoic area superficial to the patella tendon on the anterior aspect of the knee.  No communication with the  tendon sheath noted.  No cortical irregularity noted on ultrasound.  Does have moderate narrowing of the patellofemoral joint.   Impression and Recommendations:    The above documentation has been reviewed and is accurate and complete Judi Saa, DO

## 2023-09-01 ENCOUNTER — Ambulatory Visit (INDEPENDENT_AMBULATORY_CARE_PROVIDER_SITE_OTHER): Payer: Federal, State, Local not specified - PPO

## 2023-09-01 ENCOUNTER — Other Ambulatory Visit: Payer: Self-pay

## 2023-09-01 ENCOUNTER — Ambulatory Visit (INDEPENDENT_AMBULATORY_CARE_PROVIDER_SITE_OTHER): Payer: Federal, State, Local not specified - PPO | Admitting: Family Medicine

## 2023-09-01 VITALS — BP 126/84 | HR 65 | Ht 63.0 in | Wt 191.0 lb

## 2023-09-01 DIAGNOSIS — M25562 Pain in left knee: Secondary | ICD-10-CM

## 2023-09-01 DIAGNOSIS — M7042 Prepatellar bursitis, left knee: Secondary | ICD-10-CM | POA: Diagnosis not present

## 2023-09-01 NOTE — Patient Instructions (Signed)
Good to see you! Do prescribed exercises at least 3x a week Arnica lotion to knee Xray today See you again in 2 months

## 2023-09-02 ENCOUNTER — Encounter: Payer: Self-pay | Admitting: Family Medicine

## 2023-09-02 DIAGNOSIS — M7042 Prepatellar bursitis, left knee: Secondary | ICD-10-CM | POA: Insufficient documentation

## 2023-09-02 NOTE — Assessment & Plan Note (Signed)
Patient did have a prepatellar hemorrhagic bursitis.  It did seem to be ruptured and draining down the leg still.  This is consistent with patient's timeline, pain and swelling and bruising.  We discussed the prognosis of this and the long-term ramifications.  Patient has had a Doppler and ruled out any type of deep vein thrombosis.  We discussed topical anti-inflammatories, compression, discussed the possibility with some of the underlying arthritic changes that this could cause some discomfort as well.  Will have patient follow-up with me again in 6 to 8 weeks otherwise.

## 2023-09-09 ENCOUNTER — Ambulatory Visit
Admission: RE | Admit: 2023-09-09 | Discharge: 2023-09-09 | Disposition: A | Discharge status: Home / Self Care | Payer: Federal, State, Local not specified - PPO | Source: Ambulatory Visit | Attending: Family Medicine | Admitting: Family Medicine

## 2023-09-09 DIAGNOSIS — Z1231 Encounter for screening mammogram for malignant neoplasm of breast: Secondary | ICD-10-CM | POA: Diagnosis not present

## 2023-11-01 DIAGNOSIS — B078 Other viral warts: Secondary | ICD-10-CM | POA: Diagnosis not present

## 2023-11-01 DIAGNOSIS — R238 Other skin changes: Secondary | ICD-10-CM | POA: Diagnosis not present

## 2023-11-01 NOTE — Progress Notes (Unsigned)
[facility] [address] [place] [address] Phone: [phone] Subjective:   Sabrina Mcdaniel, am serving as a scribe for Dr. Antoine Primas.  I'm seeing this patient by the request  of:  Loyola Mast, MD  CC: Left  [id number]  09/01/2023 Patient did have a prepatellar hemorrhagic bursitis.  It did seem to be ruptured and draining down the leg still.  This is consistent with patient's timeline, pain and swelling and bruising.  We discussed the prognosis of this and the long-term ramifications.  Patient has had a Doppler and ruled out any type of deep vein thrombosis.  We discussed topical anti-inflammatories, compression, discussed the possibility with some of the underlying arthritic changes that this could cause some discomfort as well.  Will have patient follow-up with me again in 6 to 8 weeks otherwise.     Updated 11/02/2023 Sabrina Mcdaniel is a 58 y.o. female coming in with complaint of L knee pain, found to have more of a prepatellar bursitis patient was to monitor, discussed icing regimen and home exercises.  Patient states hip pain OMT helped, but now seeing pain in both hips going back and forth. Topical cream and lidocaine patches seem to work a little.       Past Medical History:  Diagnosis Date   Anemia    Iron deficiency   B12 deficiency    Cholelithiasis    Depression    GERD (gastroesophageal reflux disease)    Hiatal hernia 2014   Menopause 2012   Migraines    Osteopenia    Plantar fasciitis 2017   Thyroid disease    hypo   Vitamin D deficiency    Past Surgical History:  Procedure Laterality Date   ABDOMINAL HYSTERECTOMY  2009   anterior and posterior repair   COLONOSCOPY  2011   hyperplastic polyps   ESOPHAGOGASTRODUODENOSCOPY ENDOSCOPY  2013   LASIK  2008   PLANTAR FASCIA SURGERY Left 2018   REPAIR PERONEAL TENDONS ANKLE Right 03/27/2021   TONSILLECTOMY  2000   TUBAL LIGATION  1999   Social History    Socioeconomic History   Marital status: Married    Spouse name: Not on file   Number of children: 2   Years of education: Not on file   Highest education level: Associate degree: occupational, Scientist, product/process development, or vocational program  Occupational History   Occupation: Development worker, international aid    Comment: Pensions consultant  Tobacco Use   Smoking status: Former    Current packs/day: 0.00    Types: Cigarettes    Quit date: 02/14/1998    Years since quitting: 25.7   Smokeless tobacco: Never  Vaping Use   Vaping status: Never Used  Substance and Sexual Activity   Alcohol use: Not Currently   Drug use: No   Sexual activity: Yes  Other Topics Concern   Not on file  Social History Narrative   Not on file   Social Drivers of Health   Financial Resource Strain: Not on file  Food Insecurity: Not on file  Transportation Needs: Not on file  Physical Activity: Not on file  Stress: Not on file  Social Connections: Not on file   No Known Allergies Family History  Problem Relation Age of Onset   Cancer Mother 62       breast    Hypothyroidism Mother    Heart disease Father    Diabetes Father    Cancer Father  Acute myelogenous leukemia   Colon cancer Maternal Aunt    Stroke Maternal Grandmother    Cancer Maternal Grandfather        Prostate   Heart disease Paternal Grandmother    Stroke Paternal Grandfather    Colon polyps Neg Hx    Esophageal cancer Neg Hx    Rectal cancer Neg Hx    Stomach cancer Neg Hx     Current Outpatient Medications (Endocrine & Metabolic):    levothyroxine (SYNTHROID) 75 MCG tablet, Take 1 tablet (75 mcg total) by mouth daily before breakfast.   Current Outpatient Medications (Respiratory):    HYDROcodone bit-homatropine (HYCODAN) 5-1.5 MG/5ML syrup, Take 5 mLs by mouth every 8 (eight) hours as needed for cough.  Current Outpatient Medications (Analgesics):    ibuprofen (ADVIL) 200 MG tablet, Take 200 mg by mouth every 6 (six)  hours as needed.  Current Outpatient Medications (Hematological):    cyanocobalamin (VITAMIN B12) 1000 MCG/ML injection, INJECT 0.1 MLS INTO THE MUSCLE EVERY 30 DAYS  Current Outpatient Medications (Other):    gabapentin (NEURONTIN) 100 MG capsule, Take 2 capsules (200 mg total) by mouth at bedtime.   esomeprazole (NEXIUM) 20 MG capsule, Take 20 mg by mouth daily at 12 noon.   Multiple Vitamins-Minerals (MULTIVITAMIN WITH MINERALS) tablet, Take 1 tablet by mouth daily.   Vitamin D, Ergocalciferol, (DRISDOL) 1.25 MG (50000 UNIT) CAPS capsule, Take 1 capsule (50,000 Units total) by mouth every 7 (seven) days.   Reviewed prior external information including notes and imaging from  primary care provider As well as notes that were available from care everywhere and other healthcare systems.  Past medical history, social, surgical and family history all reviewed in electronic medical record.  No pertanent information unless stated regarding to the chief complaint.   Review of Systems:  No headache, visual changes, nausea, vomiting, diarrhea, constipation, dizziness, abdominal pain, skin rash, fevers, chills, night sweats, weight loss, swollen lymph nodes, body aches, joint swelling, chest pain, shortness of breath, mood changes. POSITIVE muscle aches  Objective  Blood pressure 104/66, pulse 74, height 5\' 3"  (1.6 m), weight 190 lb (86.2 kg), SpO2 96%.   General: No apparent distress alert and oriented x3 mood and affect normal, dressed appropriately.  HEENT: Pupils equal, extraocular movements intact  Respiratory: Patient's speak in full sentences and does not appear short of breath  Cardiovascular: No lower extremity edema, non tender, no erythema   Low back does have some loss lordosis noted.  Some tenderness to palpation in the sacroiliac joint left greater than right.  Positive left sacroiliac FABER test.  Negative straight leg test.  Osteopathic findings with C2 flexed rotated and side  bent right C4 flexed rotated and side bent left C7 flexed rotated and side bent left T3 extended rotated and side bent right inhaled third rib T9 extended rotated and side bent left L2 flexed rotated and side bent right Sacrum left on left     Impression and Recommendations:    The above documentation has been reviewed and is accurate and complete Judi Saa, DO

## 2023-11-02 ENCOUNTER — Ambulatory Visit: Payer: Federal, State, Local not specified - PPO | Admitting: Family Medicine

## 2023-11-02 ENCOUNTER — Encounter: Payer: Self-pay | Admitting: Family Medicine

## 2023-11-02 ENCOUNTER — Ambulatory Visit (INDEPENDENT_AMBULATORY_CARE_PROVIDER_SITE_OTHER): Payer: Federal, State, Local not specified - PPO

## 2023-11-02 VITALS — BP 104/66 | HR 74 | Ht 63.0 in | Wt 190.0 lb

## 2023-11-02 DIAGNOSIS — M9901 Segmental and somatic dysfunction of cervical region: Secondary | ICD-10-CM | POA: Diagnosis not present

## 2023-11-02 DIAGNOSIS — M9903 Segmental and somatic dysfunction of lumbar region: Secondary | ICD-10-CM

## 2023-11-02 DIAGNOSIS — M9908 Segmental and somatic dysfunction of rib cage: Secondary | ICD-10-CM | POA: Diagnosis not present

## 2023-11-02 DIAGNOSIS — M9902 Segmental and somatic dysfunction of thoracic region: Secondary | ICD-10-CM | POA: Diagnosis not present

## 2023-11-02 DIAGNOSIS — I878 Other specified disorders of veins: Secondary | ICD-10-CM | POA: Diagnosis not present

## 2023-11-02 DIAGNOSIS — M48061 Spinal stenosis, lumbar region without neurogenic claudication: Secondary | ICD-10-CM | POA: Diagnosis not present

## 2023-11-02 DIAGNOSIS — M545 Low back pain, unspecified: Secondary | ICD-10-CM | POA: Diagnosis not present

## 2023-11-02 DIAGNOSIS — M47816 Spondylosis without myelopathy or radiculopathy, lumbar region: Secondary | ICD-10-CM | POA: Diagnosis not present

## 2023-11-02 DIAGNOSIS — M25552 Pain in left hip: Secondary | ICD-10-CM | POA: Diagnosis not present

## 2023-11-02 DIAGNOSIS — M25551 Pain in right hip: Secondary | ICD-10-CM

## 2023-11-02 DIAGNOSIS — R102 Pelvic and perineal pain: Secondary | ICD-10-CM | POA: Diagnosis not present

## 2023-11-02 DIAGNOSIS — M9904 Segmental and somatic dysfunction of sacral region: Secondary | ICD-10-CM

## 2023-11-02 MED ORDER — GABAPENTIN 100 MG PO CAPS: 200.0000 mg | ORAL_CAPSULE | Freq: Every day | ORAL | 0 refills | Status: DC

## 2023-11-02 NOTE — Assessment & Plan Note (Signed)
Multifactorial but does seem to be more secondary to home exercises and icing regimen.  Increase activity slowly.  We discussed that we will get x-rays to further evaluate.  Started on gabapentin in case there is some lumbar radiculopathy that is contributing.  Follow-up again in 6 to 12 weeks.

## 2023-11-02 NOTE — Patient Instructions (Signed)
Gabapentin 200mg  Remember new stretch See you again in 2 months

## 2023-11-03 ENCOUNTER — Ambulatory Visit: Payer: Federal, State, Local not specified - PPO | Admitting: Family Medicine

## 2023-11-17 ENCOUNTER — Encounter: Payer: Self-pay | Admitting: Family Medicine

## 2023-12-05 ENCOUNTER — Ambulatory Visit (INDEPENDENT_AMBULATORY_CARE_PROVIDER_SITE_OTHER): Payer: Federal, State, Local not specified - PPO | Admitting: Family Medicine

## 2023-12-05 ENCOUNTER — Encounter: Payer: Self-pay | Admitting: Family Medicine

## 2023-12-05 VITALS — BP 120/74 | HR 65 | Temp 98.0°F | Ht 63.0 in | Wt 192.0 lb

## 2023-12-05 DIAGNOSIS — E559 Vitamin D deficiency, unspecified: Secondary | ICD-10-CM | POA: Diagnosis not present

## 2023-12-05 DIAGNOSIS — E538 Deficiency of other specified B group vitamins: Secondary | ICD-10-CM | POA: Diagnosis not present

## 2023-12-05 DIAGNOSIS — Z0000 Encounter for general adult medical examination without abnormal findings: Secondary | ICD-10-CM

## 2023-12-05 DIAGNOSIS — E611 Iron deficiency: Secondary | ICD-10-CM | POA: Diagnosis not present

## 2023-12-05 DIAGNOSIS — M2550 Pain in unspecified joint: Secondary | ICD-10-CM | POA: Diagnosis not present

## 2023-12-05 DIAGNOSIS — L309 Dermatitis, unspecified: Secondary | ICD-10-CM

## 2023-12-05 DIAGNOSIS — R5381 Other malaise: Secondary | ICD-10-CM | POA: Diagnosis not present

## 2023-12-05 DIAGNOSIS — E039 Hypothyroidism, unspecified: Secondary | ICD-10-CM

## 2023-12-05 DIAGNOSIS — R748 Abnormal levels of other serum enzymes: Secondary | ICD-10-CM

## 2023-12-05 DIAGNOSIS — R7401 Elevation of levels of liver transaminase levels: Secondary | ICD-10-CM

## 2023-12-05 MED ORDER — TRIAMCINOLONE ACETONIDE 0.1 % EX CREA: 1.0000 | TOPICAL_CREAM | Freq: Two times a day (BID) | CUTANEOUS | 0 refills | Status: DC

## 2023-12-05 NOTE — Assessment & Plan Note (Signed)
 I will reassess her Vitamin D level, esp. in light of complaint of malaise.

## 2023-12-05 NOTE — Progress Notes (Signed)
 [facility] [address] [place] [address] Dept: [phone] Dept Fax: [phone]  Annual Physical Visit  Subjective:    Patient ID: Sabrina Mcdaniel, female    DOB: Apr 24, 1966, 58 y.o..   MRN: [id number]  Chief Complaint  Patient presents with   Annual Exam    CPE/labs. C/o having low energy levels, low back pain (using Lidocaine patches), and sinus issues again.    History of Present Illness:  Patient is in today for an annual physical/preventative visit.  Ms. Pancoast has a history of hypothyroidism. She is managed on levothyroxine 75 mcg daily.   Ms. Laroche has a history of GERD. She is on esomeprazole 20 mg daily.    Ms. Yanko has a history of Vitamin B12 deficiency. She was tried on oral B12, but her deficiency returned. She continues monthly injections.  Review of Systems  Constitutional:  Positive for malaise/fatigue. Negative for chills, diaphoresis, fever and weight loss.       Ms. Googe notes that she has a general sense of low energy over the past few months. She associates that with some generalized body aches/arthralgias she has experienced. She had a dental infection in January that resulted in having a root canal.  HENT:  Positive for congestion. Negative for ear pain, hearing loss, sinus pain, sore throat and tinnitus.        Has periodic congestion at times. She does not necessarily feel this is allergic.  Eyes:  Negative for blurred vision, pain, discharge and redness.  Respiratory:  Negative for cough, shortness of breath and wheezing.   Cardiovascular:  Negative for chest pain and palpitations.  Gastrointestinal:  Negative for abdominal pain, constipation, diarrhea, heartburn, nausea and vomiting.  Musculoskeletal:  Positive for joint pain and myalgias. Negative for back pain.       Some generalized body aches/joint achiness.  Skin:  Positive for itching. Negative for rash.       Notes a spot in the left  upper back that is recurrently itching. she is unsure as to the cause.  Psychiatric/Behavioral:  Negative for depression. The patient is not nervous/anxious.    Past Medical History: Patient Active Problem List   Diagnosis Date Noted   Left hip pain 11/02/2023   Prepatellar bursitis, left knee 09/02/2023   Vitamin B12 deficiency 08/26/2021   Gastroesophageal reflux disease 08/26/2021   History of colon polyps 08/26/2021   Abnormal mammogram of left breast 07/13/2021   Epidermoid cyst of skin 08/06/2019   Peroneal tendinitis of right lower leg 02/28/2019   Vitamin D deficiency 09/01/2017   Iron deficiency 07/08/2017   Hypothyroidism 05/27/2017   Midline cystocele 10/31/2014   Urethrocele 10/31/2014   Coccyx pain 10/17/2014   Proteinuria 10/17/2014   Rectocele 10/17/2014   Past Surgical History:  Procedure Laterality Date   ABDOMINAL HYSTERECTOMY  2009   anterior and posterior repair   COLONOSCOPY  2011   hyperplastic polyps   ESOPHAGOGASTRODUODENOSCOPY ENDOSCOPY  2013   LASIK  2008   PLANTAR FASCIA SURGERY Left 2018   REPAIR PERONEAL TENDONS ANKLE Right 03/27/2021   TONSILLECTOMY  2000   TUBAL LIGATION  1999   Family History  Problem Relation Age of Onset   Cancer Mother 79       breast    Hypothyroidism Mother    Heart disease Father    Diabetes Father    Cancer Father        Acute myelogenous leukemia   Colon  cancer Maternal Aunt    Stroke Maternal Grandmother    Cancer Maternal Grandfather        Prostate   Heart disease Paternal Grandmother    Stroke Paternal Grandfather    Colon polyps Neg Hx    Esophageal cancer Neg Hx    Rectal cancer Neg Hx    Stomach cancer Neg Hx    Outpatient Medications Prior to Visit  Medication Sig Dispense Refill   cyanocobalamin (VITAMIN B12) 1000 MCG/ML injection INJECT 0.1 MLS INTO THE MUSCLE EVERY 30 DAYS 1 mL 12   esomeprazole (NEXIUM) 20 MG capsule Take 20 mg by mouth daily at 12 noon.     ibuprofen (ADVIL) 200 MG tablet  Take 200 mg by mouth every 6 (six) hours as needed.     levothyroxine (SYNTHROID) 75 MCG tablet Take 1 tablet (75 mcg total) by mouth daily before breakfast. 90 tablet 3   Multiple Vitamins-Minerals (MULTIVITAMIN WITH MINERALS) tablet Take 1 tablet by mouth daily.     gabapentin (NEURONTIN) 100 MG capsule Take 2 capsules (200 mg total) by mouth at bedtime. 180 capsule 0   Vitamin D, Ergocalciferol, (DRISDOL) 1.25 MG (50000 UNIT) CAPS capsule Take 1 capsule (50,000 Units total) by mouth every 7 (seven) days. (Patient not taking: Reported on 12/05/2023) 12 capsule 3   HYDROcodone bit-homatropine (HYCODAN) 5-1.5 MG/5ML syrup Take 5 mLs by mouth every 8 (eight) hours as needed for cough. 120 mL 0   No facility-administered medications prior to visit.   No Known Allergies Objective:   Today's Vitals   12/05/23 1409  BP: 120/74  Pulse: 65  Temp: 98 F (36.7 C)  TempSrc: Temporal  SpO2: 97%  Weight: 192 lb (87.1 kg)  Height: 5\' 3"  (1.6 m)   Body mass index is 34.01 kg/m.   General: Well developed, well nourished. No acute distress. HEENT: Normocephalic, non-traumatic. PERRL, EOMI. Conjunctiva clear. External ears normal. EAC and TMs   normal bilaterally. Nose clear without congestion or rhinorrhea. Mucous membranes moist. Oropharynx clear. Good   dentition. Neck: Supple. No lymphadenopathy. No thyromegaly. Lungs: Clear to auscultation bilaterally. No wheezing, rales or rhonchi. CV: RRR without murmurs or rubs. Pulses 2+ bilaterally. Abdomen: Soft, non-tender. Bowel sounds positive, normal pitch and frequency. No hepatosplenomegaly. No   rebound or guarding. Extremities: Full ROM. No joint swelling or tenderness. No edema noted. Skin: Warm and dry. There is a small papule in the left upper back. There is no associated redness or swelling. Psych: Alert and oriented. Normal mood and affect.  Health Maintenance Due  Topic Date Due   HIV Screening  Never done     Assessment & Plan:    Problem List Items Addressed This Visit       Endocrine   Hypothyroidism   I will reassess her TSH today, esp. as this could relate to her complaint of malaise.       Relevant Orders   TSH     Other   Iron deficiency   I will reassess iron levels.      Relevant Orders   CBC with Differential/Platelet   Iron, TIBC and Ferritin Panel   Vitamin B12 deficiency   I will reassess her Vitamin B12 level, esp. in light of complaint of malaise.      Relevant Orders   Vitamin B12   Vitamin D deficiency   I will reassess her Vitamin D level, esp. in light of complaint of malaise.      Relevant Orders  VITAMIN D 25 Hydroxy (Vit-D Deficiency, Fractures)   Other Visit Diagnoses       Annual physical exam    -  Primary   Overall good health. Recommend ongoign regular exercise and adequate sleep. Discussed recommended screenings and immunizations.     Arthralgia of multiple sites       Etiology is unclear. This may relate to menopause. I will check her ESR and CRP to assess for any inflammatory condition.   Relevant Orders   Comprehensive metabolic panel   Sedimentation rate   C-reactive protein     Malaise       Etiology uncertain. I will check screening labs for common causes.   Relevant Orders   TSH   VITAMIN D 25 Hydroxy (Vit-D Deficiency, Fractures)   CBC with Differential/Platelet   Comprehensive metabolic panel   Vitamin B12   Iron, TIBC and Ferritin Panel   Sedimentation rate   C-reactive protein     Dermatitis       Non-descript papule. I will prescribed TAC cream.   Relevant Medications   triamcinolone cream (KENALOG) 0.1 %       Return in about 1 year (around 12/04/2024) for Annual preventative care.   Loyola Mast, MD

## 2023-12-05 NOTE — Assessment & Plan Note (Signed)
 I will reassess her TSH today, esp. as this could relate to her complaint of malaise.

## 2023-12-05 NOTE — Assessment & Plan Note (Signed)
 I will reassess her Vitamin B12 level, esp. in light of complaint of malaise.

## 2023-12-05 NOTE — Assessment & Plan Note (Signed)
 I will reassess iron levels.

## 2023-12-06 ENCOUNTER — Encounter: Payer: Self-pay | Admitting: Family Medicine

## 2023-12-06 LAB — CBC WITH DIFFERENTIAL/PLATELET
Basophils Absolute: 0.1 10*3/uL (ref 0.0–0.1)
Basophils Relative: 0.9 % (ref 0.0–3.0)
Eosinophils Absolute: 0.2 10*3/uL (ref 0.0–0.7)
Eosinophils Relative: 3.4 % (ref 0.0–5.0)
HCT: 42.5 % (ref 36.0–46.0)
Hemoglobin: 14.2 g/dL (ref 12.0–15.0)
Lymphocytes Relative: 38.8 % (ref 12.0–46.0)
Lymphs Abs: 2.6 10*3/uL (ref 0.7–4.0)
MCHC: 33.5 g/dL (ref 30.0–36.0)
MCV: 86.9 fl (ref 78.0–100.0)
Monocytes Absolute: 0.6 10*3/uL (ref 0.1–1.0)
Monocytes Relative: 8.8 % (ref 3.0–12.0)
Neutro Abs: 3.2 10*3/uL (ref 1.4–7.7)
Neutrophils Relative %: 48.1 % (ref 43.0–77.0)
Platelets: 283 10*3/uL (ref 150.0–400.0)
RBC: 4.89 Mil/uL (ref 3.87–5.11)
RDW: 13.8 % (ref 11.5–15.5)
WBC: 6.7 10*3/uL (ref 4.0–10.5)

## 2023-12-06 LAB — COMPREHENSIVE METABOLIC PANEL
ALT: 46 U/L — ABNORMAL HIGH (ref 0–35)
AST: 30 U/L (ref 0–37)
Albumin: 4.5 g/dL (ref 3.5–5.2)
Alkaline Phosphatase: 134 U/L — ABNORMAL HIGH (ref 39–117)
BUN: 17 mg/dL (ref 6–23)
CO2: 26 meq/L (ref 19–32)
Calcium: 10.3 mg/dL (ref 8.4–10.5)
Chloride: 104 meq/L (ref 96–112)
Creatinine, Ser: 0.7 mg/dL (ref 0.40–1.20)
GFR: 94.93 mL/min (ref >60.00)
Glucose, Bld: 96 mg/dL (ref 70–99)
Potassium: 3.9 meq/L (ref 3.5–5.1)
Sodium: 140 meq/L (ref 135–145)
Total Bilirubin: 0.3 mg/dL (ref 0.2–1.2)
Total Protein: 7.7 g/dL (ref 6.0–8.3)

## 2023-12-06 LAB — IRON,TIBC AND FERRITIN PANEL
%SAT: 13 % — ABNORMAL LOW (ref 16–45)
Ferritin: 56 ng/mL (ref 16–232)
Iron: 54 ug/dL (ref 45–160)
TIBC: 413 ug/dL (ref 250–450)

## 2023-12-06 LAB — VITAMIN D 25 HYDROXY (VIT D DEFICIENCY, FRACTURES): VITD: 28.52 ng/mL — ABNORMAL LOW (ref 30.00–100.00)

## 2023-12-06 LAB — SEDIMENTATION RATE: Sed Rate: 33 mm/h — ABNORMAL HIGH (ref 0–30)

## 2023-12-06 LAB — VITAMIN B12: Vitamin B-12: 1006 pg/mL — ABNORMAL HIGH (ref 211–911)

## 2023-12-06 LAB — TSH: TSH: 3.38 u[IU]/mL (ref 0.35–5.50)

## 2023-12-06 LAB — C-REACTIVE PROTEIN: CRP: 1.1 mg/dL (ref 0.5–20.0)

## 2023-12-06 NOTE — Addendum Note (Signed)
 Addended by: Loyola Mast on: 12/06/2023 01:00 PM   Modules accepted: Orders

## 2023-12-14 ENCOUNTER — Other Ambulatory Visit: Payer: Self-pay | Admitting: Family Medicine

## 2023-12-14 DIAGNOSIS — E039 Hypothyroidism, unspecified: Secondary | ICD-10-CM

## 2023-12-15 ENCOUNTER — Ambulatory Visit
Admission: RE | Admit: 2023-12-15 | Discharge: 2023-12-15 | Disposition: A | Discharge status: Home / Self Care | Source: Ambulatory Visit | Attending: Family Medicine | Admitting: Family Medicine

## 2023-12-15 ENCOUNTER — Encounter: Payer: Self-pay | Admitting: Family Medicine

## 2023-12-15 DIAGNOSIS — K769 Liver disease, unspecified: Secondary | ICD-10-CM | POA: Diagnosis not present

## 2023-12-15 DIAGNOSIS — R748 Abnormal levels of other serum enzymes: Secondary | ICD-10-CM

## 2023-12-15 DIAGNOSIS — R7401 Elevation of levels of liver transaminase levels: Secondary | ICD-10-CM

## 2023-12-15 DIAGNOSIS — K8020 Calculus of gallbladder without cholecystitis without obstruction: Secondary | ICD-10-CM | POA: Diagnosis not present

## 2023-12-15 DIAGNOSIS — K760 Fatty (change of) liver, not elsewhere classified: Secondary | ICD-10-CM | POA: Insufficient documentation

## 2023-12-20 ENCOUNTER — Ambulatory Visit: Payer: Federal, State, Local not specified - PPO | Admitting: Family Medicine

## 2024-05-01 DIAGNOSIS — L918 Other hypertrophic disorders of the skin: Secondary | ICD-10-CM | POA: Diagnosis not present

## 2024-05-01 DIAGNOSIS — D492 Neoplasm of unspecified behavior of bone, soft tissue, and skin: Secondary | ICD-10-CM | POA: Diagnosis not present

## 2024-05-01 DIAGNOSIS — L538 Other specified erythematous conditions: Secondary | ICD-10-CM | POA: Diagnosis not present

## 2024-05-01 DIAGNOSIS — L821 Other seborrheic keratosis: Secondary | ICD-10-CM | POA: Diagnosis not present

## 2024-05-01 DIAGNOSIS — D2361 Other benign neoplasm of skin of right upper limb, including shoulder: Secondary | ICD-10-CM | POA: Diagnosis not present

## 2024-05-01 DIAGNOSIS — L814 Other melanin hyperpigmentation: Secondary | ICD-10-CM | POA: Diagnosis not present

## 2024-05-01 DIAGNOSIS — D225 Melanocytic nevi of trunk: Secondary | ICD-10-CM | POA: Diagnosis not present

## 2024-05-01 DIAGNOSIS — D2261 Melanocytic nevi of right upper limb, including shoulder: Secondary | ICD-10-CM | POA: Diagnosis not present

## 2024-07-11 ENCOUNTER — Ambulatory Visit: Admitting: Nurse Practitioner

## 2024-07-11 ENCOUNTER — Encounter: Payer: Self-pay | Admitting: Nurse Practitioner

## 2024-07-11 VITALS — BP 130/79 | HR 64 | Temp 97.5°F | Ht 62.5 in | Wt 192.6 lb

## 2024-07-11 DIAGNOSIS — R0982 Postnasal drip: Secondary | ICD-10-CM | POA: Diagnosis not present

## 2024-07-11 MED ORDER — PREDNISONE 20 MG PO TABS: 40.0000 mg | ORAL_TABLET | Freq: Every day | ORAL | 0 refills | Status: DC

## 2024-07-11 NOTE — Patient Instructions (Addendum)
 Start allegra 180mg  daily or zyrtec 10mg  daily x 14days Start prednisone  x 3days. Take with food. Call office if no improvement in 2weeks

## 2024-07-11 NOTE — Progress Notes (Signed)
 Acute Office Visit  Subjective:    Patient ID: Sabrina Mcdaniel, female    DOB: [birth year], 58 y.o.   MRN: [id number]  Chief Complaint  Patient presents with   Congestion of throat     Mucus congestion in throat was sick 7 weeks ago clear and milky in color feel like coming through nasal when clearing throat     HPI Patient is in today for post nasal drainage x 7weeks. Constant, no sore throat, describes as constant need to clear her throat, no chest congestion, No sinus pressure/congestion, symptom does not change with food intake or in supine position, . GERD symptoms controlled with use of nexium daily. She reports minimal improvement with Salt water rinse, robitussim, mucinex D, hot liquids.  Outpatient Medications Prior to Visit  Medication Sig   cyanocobalamin  (VITAMIN B12) 1000 MCG/ML injection INJECT 0.1 MLS INTO THE MUSCLE EVERY 30 DAYS   esomeprazole (NEXIUM) 20 MG capsule Take 20 mg by mouth daily at 12 noon.   ibuprofen (ADVIL) 200 MG tablet Take 200 mg by mouth every 6 (six) hours as needed.   levothyroxine  (SYNTHROID ) 75 MCG tablet TAKE 1 TABLET BY MOUTH ONCE DAILY BEFORE BREAKFAST   Multiple Vitamins-Minerals (MULTIVITAMIN WITH MINERALS) tablet Take 1 tablet by mouth daily.   triamcinolone  cream (KENALOG ) 0.1 % Apply 1 Application topically 2 (two) times daily.   Vitamin D , Ergocalciferol , (DRISDOL ) 1.25 MG (50000 UNIT) CAPS capsule Take 1 capsule (50,000 Units total) by mouth every 7 (seven) days.   No facility-administered medications prior to visit.   Reviewed past medical and social history.  Review of Systems Per HPI     Objective:    Physical Exam Vitals and nursing note reviewed.  Constitutional:      General: She is not in acute distress. HENT:     Right Ear: Tympanic membrane, ear canal and external ear normal.     Left Ear: Tympanic membrane, ear canal and external ear normal.     Nose: No nasal tenderness, mucosal edema, congestion or rhinorrhea.      Right Nostril: No occlusion.     Left Nostril: No occlusion.     Right Turbinates: Not enlarged, swollen or pale.     Left Turbinates: Not enlarged, swollen or pale.     Right Sinus: No maxillary sinus tenderness or frontal sinus tenderness.     Left Sinus: No maxillary sinus tenderness or frontal sinus tenderness.     Mouth/Throat:     Pharynx: Oropharynx is clear. Uvula midline. No oropharyngeal exudate or posterior oropharyngeal erythema.     Tonsils: No tonsillar exudate or tonsillar abscesses.     Comments: Mucus noted in post pharynx Cardiovascular:     Rate and Rhythm: Normal rate.     Pulses: Normal pulses.  Pulmonary:     Effort: Pulmonary effort is normal.  Musculoskeletal:     Cervical back: Normal range of motion and neck supple.  Lymphadenopathy:     Cervical: No cervical adenopathy.  Skin:    Findings: No rash.  Neurological:     Mental Status: She is alert and oriented to person, place, and time.    BP 130/79 (BP Location: Right Arm, Patient Position: Sitting, Cuff Size: Large)   Pulse 64   Temp (!) 97.5 F (36.4 C) (Oral)   Ht 5' 2.5 (1.588 m)   Wt 192 lb 9.6 oz (87.4 kg)   SpO2 96%   BMI 34.67 kg/m    No results found  for any visits on 07/11/24.     Assessment & Plan:   Problem List Items Addressed This Visit   None Visit Diagnoses       PND (post-nasal drip)    -  Primary   Relevant Medications   predniSONE  (DELTASONE ) 20 MG tablet      Meds ordered this encounter  Medications   predniSONE  (DELTASONE ) 20 MG tablet    Sig: Take 2 tablets (40 mg total) by mouth daily with breakfast.    Dispense:  6 tablet    Refill:  0    Supervising Provider:   BERNETA FALLOW ALFRED [[id number]]  Start allegra 180mg  daily or zyrtec 10mg  daily x 14days Start prednisone  x 3days. Take with food. Call office if no improvement in 2weeks  Return if symptoms worsen or fail to improve.  Roselie Mood, NP

## 2024-07-25 ENCOUNTER — Encounter: Payer: Self-pay | Admitting: Family Medicine

## 2024-07-25 ENCOUNTER — Ambulatory Visit: Admitting: Family Medicine

## 2024-07-25 VITALS — BP 130/82 | HR 71 | Temp 97.2°F | Ht 62.5 in | Wt 191.4 lb

## 2024-07-25 DIAGNOSIS — K219 Gastro-esophageal reflux disease without esophagitis: Secondary | ICD-10-CM

## 2024-07-25 DIAGNOSIS — R0989 Other specified symptoms and signs involving the circulatory and respiratory systems: Secondary | ICD-10-CM | POA: Diagnosis not present

## 2024-07-25 MED ORDER — ESOMEPRAZOLE MAGNESIUM 40 MG PO CPDR: 40.0000 mg | DELAYED_RELEASE_CAPSULE | Freq: Every day | ORAL | 3 refills | Status: DC

## 2024-07-25 NOTE — Progress Notes (Signed)
 [facility] [address] [place] [address] Dept: [phone] Dept Fax: [phone]  Office Visit  Subjective:    Patient ID: Sabrina Mcdaniel, female    DOB: [birth year], 58 y.o..   MRN: [id number]  Chief Complaint  Patient presents with   Sinus Problem    C/o having mucus/phelgm, sinus drainage x 2 months.  Declines flu shot.    History of Present Illness:  Patient is in today for Post-nasal Drip.  Sabrina Mcdaniel was seen on 07/11/2024 for constant post-nasal drainage ongoing for the past several weeks without associated symptoms of sore throat, chest congestion, sinus pressure or congestion. She did not experience improvement with food intake or when laying down, and only experienced minimal improved after using salt water rinse, Robitussin, Mucinex D, or hot liquids. She does have a history of GERD, which she manages with  OTC Nexium 20 mg daily.   Sabrina Mcdaniel was prescribed a course of prednisone . She did not find that this helped. She has been more vigilant about her symptoms. She notes this is actually more an issue of repeated throat clearing rather than PND. She finds it gradually worsens through the day. She wonders if this is really a laryngopharyngeal reflux issue  Past Medical History: Patient Active Problem List   Diagnosis Date Noted   Cholelithiasis 12/15/2023   Fatty liver 12/15/2023   Left hip pain 11/02/2023   Prepatellar bursitis, left knee 09/02/2023   Vitamin B12 deficiency 08/26/2021   Gastroesophageal reflux disease 08/26/2021   History of colon polyps 08/26/2021   Abnormal mammogram of left breast 07/13/2021   Epidermoid cyst of skin 08/06/2019   Peroneal tendinitis of right lower leg 02/28/2019   Vitamin D  deficiency 09/01/2017   Iron deficiency 07/08/2017   Hypothyroidism 05/27/2017   Midline cystocele 10/31/2014   Urethrocele 10/31/2014   Coccyx pain 10/17/2014   Proteinuria 10/17/2014   Rectocele  10/17/2014   Past Surgical History:  Procedure Laterality Date   ABDOMINAL HYSTERECTOMY  2009   anterior and posterior repair   COLONOSCOPY  2011   hyperplastic polyps   ESOPHAGOGASTRODUODENOSCOPY ENDOSCOPY  2013   LASIK  2008   PLANTAR FASCIA SURGERY Left 2018   REPAIR PERONEAL TENDONS ANKLE Right 03/27/2021   TONSILLECTOMY  2000   TUBAL LIGATION  1999   Family History  Problem Relation Age of Onset   Cancer Mother 34       breast    Hypothyroidism Mother    Hearing loss Mother    Heart disease Father    Diabetes Father    Cancer Father        Acute myelogenous leukemia   Colon cancer Maternal Aunt    Stroke Maternal Grandmother    Cancer Maternal Grandfather        Prostate   Heart disease Paternal Grandmother    Stroke Paternal Grandfather    Colon polyps Neg Hx    Esophageal cancer Neg Hx    Rectal cancer Neg Hx    Stomach cancer Neg Hx    Outpatient Medications Prior to Visit  Medication Sig Dispense Refill   cyanocobalamin  (VITAMIN B12) 1000 MCG/ML injection INJECT 0.1 MLS INTO THE MUSCLE EVERY 30 DAYS 1 mL 12   esomeprazole (NEXIUM) 20 MG capsule Take 20 mg by mouth daily at 12 noon.     ibuprofen (ADVIL) 200 MG tablet Take 200 mg by mouth every 6 (six) hours as needed.     levothyroxine  (SYNTHROID )  75 MCG tablet TAKE 1 TABLET BY MOUTH ONCE DAILY BEFORE BREAKFAST 90 tablet 3   Multiple Vitamins-Minerals (MULTIVITAMIN WITH MINERALS) tablet Take 1 tablet by mouth daily.     predniSONE  (DELTASONE ) 20 MG tablet Take 2 tablets (40 mg total) by mouth daily with breakfast. 6 tablet 0   triamcinolone  cream (KENALOG ) 0.1 % Apply 1 Application topically 2 (two) times daily. 30 g 0   Vitamin D , Ergocalciferol , (DRISDOL ) 1.25 MG (50000 UNIT) CAPS capsule Take 1 capsule (50,000 Units total) by mouth every 7 (seven) days. 12 capsule 3   No facility-administered medications prior to visit.   No Known Allergies   Objective:   Today's Vitals   07/25/24 1118  BP: 130/82   Pulse: 71  Temp: (!) 97.2 F (36.2 C)  TempSrc: Temporal  SpO2: 98%  Weight: 191 lb 6.4 oz (86.8 kg)  Height: 5' 2.5 (1.588 m)   Body mass index is 34.45 kg/m.   General: Well developed, well nourished. No acute distress. HEENT: Normocephalic, non-traumatic. Nose clear without congestion or rhinorrhea. Mucous membranes moist.   Oropharynx clear. Good dentition. Neck: Supple. No lymphadenopathy. No thyromegaly. Lungs: Clear to auscultation bilaterally. No wheezing, rales or rhonchi. Psych: Alert and oriented. Normal mood and affect.  Health Maintenance Due  Topic Date Due   HIV Screening  Never done   Hepatitis B Vaccines 19-59 Average Risk (1 of 3 - 19+ 3-dose series) Never done   Pneumococcal Vaccine: 50+ Years (1 of 1 - PCV) Never done   COVID-19 Vaccine (3 - 2025-26 season) 05/21/2024     Assessment & Plan:  1. Chronic throat clearing (Primary) 2. Gastroesophageal reflux disease without esophagitis I agree that the symptoms of throat clearing are likely related to acid reflux issues. I will prescribe a 40 mg dose of esomeprazole (Nexium) for Sabrina Mcdaniel to try. If she is not showing improvement in 2-3 weeks, I would plan referral to ENT for evaluation of her oropharynx and larynx for other potential causes.  - esomeprazole (NEXIUM) 40 MG capsule; Take 1 capsule (40 mg total) by mouth daily.  Dispense: 30 capsule; Refill: 3   Return in about 3 weeks (around 08/15/2024), or if symptoms worsen or fail to improve.    Garnette CHRISTELLA Simpler, MD  I,Emily Lagle,acting as a scribe for Garnette CHRISTELLA Simpler, MD.,have documented all relevant documentation on the behalf of Garnette CHRISTELLA Simpler, MD.  I, Garnette CHRISTELLA Simpler, MD, have reviewed all documentation for this visit. The documentation on 07/25/2024 for the exam, diagnosis, procedures, and orders are all accurate and complete.

## 2024-08-02 ENCOUNTER — Encounter: Payer: Self-pay | Admitting: Family Medicine

## 2024-08-02 DIAGNOSIS — R0989 Other specified symptoms and signs involving the circulatory and respiratory systems: Secondary | ICD-10-CM

## 2024-08-06 NOTE — Addendum Note (Signed)
 Addended by: THEDORA GARNETTE HERO on: 08/06/2024 04:59 PM   Modules accepted: Orders

## 2024-08-06 NOTE — Telephone Encounter (Signed)
 Copied from CRM #[id number]. Topic: Referral - Request for Referral >> Aug 06, 2024  2:24 PM Sabrina Mcdaniel wrote: Did the patient discuss referral with their provider in the last year? Yes (If No - schedule appointment) (If Yes - send message)  Appointment offered? Yes  Type of order/referral and detailed reason for visit: ENT , due to thick mucus in her throat  Preference of office, provider, location: [facility], patient was given their number and they advised that they require a referral from Dr.Rudd  If referral order, have you been seen by this specialty before? No (If Yes, this issue or another issue? When? Where?  Can we respond through MyChart? Yes

## 2024-08-06 NOTE — Telephone Encounter (Signed)
 Can you please place the ENT referral to [facility]? Thanks.  Dm/cma

## 2024-08-22 ENCOUNTER — Ambulatory Visit (INDEPENDENT_AMBULATORY_CARE_PROVIDER_SITE_OTHER): Admitting: Otolaryngology

## 2024-08-22 ENCOUNTER — Encounter (INDEPENDENT_AMBULATORY_CARE_PROVIDER_SITE_OTHER): Payer: Self-pay | Admitting: Otolaryngology

## 2024-08-22 VITALS — BP 135/88 | HR 64 | Temp 97.4°F | Ht 63.0 in | Wt 190.0 lb

## 2024-08-22 DIAGNOSIS — K219 Gastro-esophageal reflux disease without esophagitis: Secondary | ICD-10-CM

## 2024-08-22 MED ORDER — PANTOPRAZOLE SODIUM 40 MG PO TBEC: 40.0000 mg | DELAYED_RELEASE_TABLET | Freq: Two times a day (BID) | ORAL | 1 refills | Status: AC

## 2024-08-22 NOTE — Progress Notes (Signed)
 Reason for Consult: Globus sensation Referring Physician: Dr. Thedora Eleanor KATHEE Sabrina Mcdaniel is an 58 y.o. female.  HPI: History of a lump in the right side of her throat.  She has had this since September.  She has not had it prior.  She really does not have any pain with it.  It felt like something was stuck like a large plug of mucus that was not going up or down.  She was treated for allergies.  That did not help.  She has no nasal obstruction or congestion.  No drainage from her nose.  She does feel like she has mucus in the back of her throat.  She had a little bit of change in voice back in September but that is resolved.  She was treated with Augmentin by her primary and that seemed to cleared up slightly.  She had a large plug of debris that came out and then she felt slightly better but still had the globus lump feeling.  She has no dysphagia or odynophagia.  She does not have any change in the voice at this point.  She does have thyroid  issues with low thyroid .  She also has a history of reflux and that was significant during her pregnancy.  She tried to come off the Nexium  and that did not work well and she got all the symptoms back profoundly.  She does not really have heartburn indigestion while she is on Nexium .  She has been on that product for years.  Past Medical History:  Diagnosis Date   Anemia    Iron deficiency   B12 deficiency    Cholelithiasis    Depression    GERD (gastroesophageal reflux disease)    Hiatal hernia 2014   Menopause 2012   Migraines    Osteopenia    Plantar fasciitis 2017   Thyroid  disease    hypo   Vitamin D  deficiency     Past Surgical History:  Procedure Laterality Date   ABDOMINAL HYSTERECTOMY  2009   anterior and posterior repair   COLONOSCOPY  2011   hyperplastic polyps   ESOPHAGOGASTRODUODENOSCOPY ENDOSCOPY  2013   LASIK  2008   PLANTAR FASCIA SURGERY Left 2018   REPAIR PERONEAL TENDONS ANKLE Right 03/27/2021   TONSILLECTOMY  2000   TUBAL  LIGATION  1999    Family History  Problem Relation Age of Onset   Cancer Mother 21       breast    Hypothyroidism Mother    Hearing loss Mother    Heart disease Father    Diabetes Father    Cancer Father        Acute myelogenous leukemia   Colon cancer Maternal Aunt    Stroke Maternal Grandmother    Cancer Maternal Grandfather        Prostate   Heart disease Paternal Grandmother    Stroke Paternal Grandfather    Colon polyps Neg Hx    Esophageal cancer Neg Hx    Rectal cancer Neg Hx    Stomach cancer Neg Hx     Social History:  reports that she quit smoking about 26 years ago. Her smoking use included cigarettes. She has never used smokeless tobacco. She reports that she does not currently use alcohol. She reports that she does not use drugs.  Allergies: No Known Allergies  Medications: I have reviewed the patient's current medications.  No results found for this or any previous visit (from the past 48 hours).  No  results found.  ROS Blood pressure 135/88, pulse 64, temperature (!) 97.4 F (36.3 C), height 5' 3 (1.6 m), weight 190 lb (86.2 kg), SpO2 96%. Physical Exam Constitutional:      Appearance: Normal appearance.  HENT:     Head: Normocephalic and atraumatic.     Right Ear: Tympanic membrane is without lesions and middle ear aerated, ear canal and external ear normal.     Left Ear: Tympanic membrane is without lesions and middle ear aerated, ear canal and external ear normal.     Nose: Nose normal. Turbinates with mild hypertrophy, No significant swelling or masses.     Oral cavity/oropharynx: Mucous membranes are moist. No lesions or masses    Larynx: normal voice. Mirror attempted without success    Eyes:     Extraocular Movements: Extraocular movements intact.     Conjunctiva/sclera: Conjunctivae normal.     Pupils: Pupils are equal, round, and reactive to light.  Cardiovascular:     Rate and Rhythm: Normal rate.  Pulmonary:     Effort: Pulmonary  effort is normal.  Musculoskeletal:     Cervical back: Normal range of motion and neck supple. No rigidity.  Lymphadenopathy:     Cervical: No cervical adenopathy or masses.salivary glands without lesions. .  Neurological:     Mental Status: He is alert. CN 2-12 intact. No nystagmus      Assessment/Plan: LPR-I think that most likely given no nasal symptoms or congestion that sinusitis is not a factor and that the postnasal drip is just mucus in the throat consistent with reflux.  She has a profound amount of reflux without Nexium .  I think twice daily therapy is necessary and I do think changing the product might be helpful.  She will stop the Nexium  and start Protonix twice daily and follow-up in 1 month.  We discussed a fiberoptic exam as well as a CT scan that may be discussed further but right now she wants to try the conservative therapy.  [name] 08/22/2024, 10:53 AM

## 2024-08-29 ENCOUNTER — Other Ambulatory Visit: Payer: Self-pay | Admitting: Family Medicine

## 2024-08-29 DIAGNOSIS — Z1231 Encounter for screening mammogram for malignant neoplasm of breast: Secondary | ICD-10-CM

## 2024-09-25 ENCOUNTER — Encounter (INDEPENDENT_AMBULATORY_CARE_PROVIDER_SITE_OTHER): Payer: Self-pay | Admitting: Otolaryngology

## 2024-09-25 ENCOUNTER — Ambulatory Visit (INDEPENDENT_AMBULATORY_CARE_PROVIDER_SITE_OTHER): Admitting: Otolaryngology

## 2024-09-25 VITALS — BP 136/87 | HR 87

## 2024-09-25 DIAGNOSIS — K219 Gastro-esophageal reflux disease without esophagitis: Secondary | ICD-10-CM

## 2024-09-25 DIAGNOSIS — R1312 Dysphagia, oropharyngeal phase: Secondary | ICD-10-CM

## 2024-09-25 NOTE — Progress Notes (Signed)
 Reason for Consult: Follow-up Referring Physician: Dr. Rudd  Sabrina Mcdaniel is an 59 y.o. female.  HPI: She is still having the thick sticky mucus that she can essentially suck up into her throat.  She still has no significant nasal symptoms.  No hoarseness.  No sore throat.  No feeling of any thing in her throat.  She did not feel like the Protonix  helped at all.  Past Medical History:  Diagnosis Date   Anemia    Iron deficiency   B12 deficiency    Cholelithiasis    Depression    GERD (gastroesophageal reflux disease)    Hiatal hernia 2014   Menopause 2012   Migraines    Osteopenia    Plantar fasciitis 2017   Thyroid  disease    hypo   Vitamin D  deficiency     Past Surgical History:  Procedure Laterality Date   ABDOMINAL HYSTERECTOMY  2009   anterior and posterior repair   COLONOSCOPY  2011   hyperplastic polyps   ESOPHAGOGASTRODUODENOSCOPY ENDOSCOPY  2013   LASIK  2008   PLANTAR FASCIA SURGERY Left 2018   REPAIR PERONEAL TENDONS ANKLE Right 03/27/2021   TONSILLECTOMY  2000   TUBAL LIGATION  1999    Family History  Problem Relation Age of Onset   Cancer Mother 19       breast    Hypothyroidism Mother    Hearing loss Mother    Heart disease Father    Diabetes Father    Cancer Father        Acute myelogenous leukemia   Colon cancer Maternal Aunt    Stroke Maternal Grandmother    Cancer Maternal Grandfather        Prostate   Heart disease Paternal Grandmother    Stroke Paternal Grandfather    Colon polyps Neg Hx    Esophageal cancer Neg Hx    Rectal cancer Neg Hx    Stomach cancer Neg Hx     Social History:  reports that she quit smoking about 26 years ago. Her smoking use included cigarettes. She has never used smokeless tobacco. She reports that she does not currently use alcohol. She reports that she does not use drugs.  Allergies: Allergies[1]   No results found for this or any previous visit (from the past 48 hours).  No results  found.  ROS Blood pressure 136/87, pulse 87, SpO2 94%. Physical Exam Constitutional:      Appearance: Normal appearance.  HENT:     Head: Normocephalic and atraumatic.     Right Ear: Tympanic membrane is without lesions and middle ear aerated, ear canal and external ear normal.     Left Ear: Tympanic membrane is without lesions and middle ear aerated, ear canal and external ear normal.     Nose: Nose without deviation of septum.  Turbinates with mild hypertrophy, No significant swelling or masses.     Oral cavity/oropharynx: Mucous membranes are moist. No lesions or masses    Larynx: normal voice. Mirror attempted without success    Eyes:     Extraocular Movements: Extraocular movements intact.     Conjunctiva/sclera: Conjunctivae normal.     Pupils: Pupils are equal, round, and reactive to light.  Cardiovascular:     Rate and Rhythm: Normal rate.  Pulmonary:     Effort: Pulmonary effort is normal.  Musculoskeletal:     Cervical back: Normal range of motion and neck supple. No rigidity.  Lymphadenopathy:     Cervical: No  cervical adenopathy or masses.salivary glands without lesions. .     Salivary glands- no mass or swelling Neurological:     Mental Status: He is alert. CN 2-12 intact. No nystagmus      Assessment/Plan: Mucus in the throat-she is still having the sticky mucus that is extremely bothersome.  She wants to continue to work it up.  She will get a barium swallow.  She has had 2 fevers that were 24 hours with no other symptoms.  I am going to get a CBC and blood chemistry to see be sure everything is okay.  We talked about a CT of her sinuses and we again discussed the fiberoptic exam of her larynx.  I think the fiberoptic is low yield for right now as indicating some source for the mucus.  Sabrina Mcdaniel Notice 09/25/2024, 1:26 PM        [1] No Known Allergies

## 2024-09-27 ENCOUNTER — Ambulatory Visit
Admission: RE | Admit: 2024-09-27 | Discharge: 2024-09-27 | Disposition: A | Discharge status: Home / Self Care | Source: Ambulatory Visit | Attending: Family Medicine | Admitting: Family Medicine

## 2024-09-27 DIAGNOSIS — Z1231 Encounter for screening mammogram for malignant neoplasm of breast: Secondary | ICD-10-CM

## 2024-09-28 LAB — BASIC METABOLIC PANEL WITH GFR
BUN: 18 mg/dL (ref 7–25)
CO2: 27 mmol/L (ref 20–32)
Calcium: 9.5 mg/dL (ref 8.6–10.4)
Chloride: 106 mmol/L (ref 98–110)
Creat: 0.84 mg/dL (ref 0.50–1.03)
Glucose, Bld: 82 mg/dL (ref 65–139)
Potassium: 4.6 mmol/L (ref 3.5–5.3)
Sodium: 141 mmol/L (ref 135–146)
eGFR: 80 mL/min/1.73m2

## 2024-09-28 LAB — CBC WITH DIFFERENTIAL/PLATELET
Absolute Lymphocytes: 2784 {cells}/uL (ref 850–3900)
Absolute Monocytes: 678 {cells}/uL (ref 200–950)
Basophils Absolute: 58 {cells}/uL (ref 0–200)
Basophils Relative: 0.9 %
Eosinophils Absolute: 141 {cells}/uL (ref 15–500)
Eosinophils Relative: 2.2 %
HCT: 40.5 % (ref 35.9–46.0)
Hemoglobin: 13.2 g/dL (ref 11.7–15.5)
MCH: 27.9 pg (ref 27.0–33.0)
MCHC: 32.6 g/dL (ref 31.6–35.4)
MCV: 85.6 fL (ref 81.4–101.7)
MPV: 12.7 fL — ABNORMAL HIGH (ref 7.5–12.5)
Monocytes Relative: 10.6 %
Neutro Abs: 2739 {cells}/uL (ref 1500–7800)
Neutrophils Relative %: 42.8 %
Platelets: 289 Thousand/uL (ref 140–400)
RBC: 4.73 Million/uL (ref 3.80–5.10)
RDW: 13.3 % (ref 11.0–15.0)
Total Lymphocyte: 43.5 %
WBC: 6.4 Thousand/uL (ref 3.8–10.8)

## 2024-10-05 ENCOUNTER — Telehealth (INDEPENDENT_AMBULATORY_CARE_PROVIDER_SITE_OTHER): Payer: Self-pay | Admitting: Otolaryngology

## 2024-10-05 NOTE — Telephone Encounter (Signed)
 Called patient and let them know after looking at Dr. Roark schedule there was no

## 2024-10-05 NOTE — Telephone Encounter (Signed)
 The patient called in letting us  know that she has her imaging scheduled for 10/10/24.  The first follow up appointment I could make on Dr Roark' schedule is on 10/31/24.   She did not want to wait that long, she would like to know if we can get her fit in prior to that date on Dr Roark' schedule.  I let her know would with check with him and get back to her early next week.  Please advise.

## 2024-10-10 ENCOUNTER — Ambulatory Visit (HOSPITAL_COMMUNITY)
Admission: RE | Admit: 2024-10-10 | Discharge: 2024-10-10 | Disposition: A | Discharge status: Home / Self Care | Source: Ambulatory Visit | Attending: Otolaryngology | Admitting: Otolaryngology

## 2024-10-10 ENCOUNTER — Other Ambulatory Visit (INDEPENDENT_AMBULATORY_CARE_PROVIDER_SITE_OTHER): Payer: Self-pay | Admitting: Otolaryngology

## 2024-10-10 DIAGNOSIS — K219 Gastro-esophageal reflux disease without esophagitis: Secondary | ICD-10-CM

## 2024-10-10 DIAGNOSIS — R1312 Dysphagia, oropharyngeal phase: Secondary | ICD-10-CM | POA: Diagnosis present

## 2024-10-16 ENCOUNTER — Telehealth (INDEPENDENT_AMBULATORY_CARE_PROVIDER_SITE_OTHER): Payer: Self-pay

## 2024-10-16 ENCOUNTER — Telehealth (INDEPENDENT_AMBULATORY_CARE_PROVIDER_SITE_OTHER): Payer: Self-pay | Admitting: Otolaryngology

## 2024-10-16 ENCOUNTER — Other Ambulatory Visit (INDEPENDENT_AMBULATORY_CARE_PROVIDER_SITE_OTHER): Payer: Self-pay | Admitting: Otolaryngology

## 2024-10-16 DIAGNOSIS — J329 Chronic sinusitis, unspecified: Secondary | ICD-10-CM

## 2024-10-16 NOTE — Telephone Encounter (Addendum)
 She needs a GI referral.  She can come back in and discuss a CT scan of her sinuses to make sure her sinus issues are not the cause of the mucus.

## 2024-10-16 NOTE — Telephone Encounter (Signed)
 Called patient and let them know that Dr. Roark that he would like to get a CT scan of her sinuses now that her barium swallow does not show anything specifically obvious and concerning other than a hiatal hernia. I can order a CT scan if she so desires before she follows up. Patient understood.

## 2024-10-25 ENCOUNTER — Ambulatory Visit (HOSPITAL_COMMUNITY): Admission: RE | Admit: 2024-10-25 | Discharge: 2024-10-25 | Attending: Otolaryngology | Admitting: Otolaryngology

## 2024-10-25 DIAGNOSIS — J329 Chronic sinusitis, unspecified: Secondary | ICD-10-CM

## 2024-10-31 ENCOUNTER — Ambulatory Visit (INDEPENDENT_AMBULATORY_CARE_PROVIDER_SITE_OTHER): Admitting: Otolaryngology
